# Patient Record
Sex: Female | Born: 1975
Health system: Southern US, Community
[De-identification: ages and names within clinical notes are randomized; demographics above are authoritative.]

## PROBLEM LIST (undated history)

## (undated) DIAGNOSIS — G709 Myoneural disorder, unspecified: Secondary | ICD-10-CM

## (undated) DIAGNOSIS — G35 Multiple sclerosis: Secondary | ICD-10-CM

## (undated) DIAGNOSIS — R51 Headache: Secondary | ICD-10-CM

## (undated) DIAGNOSIS — T7840XA Allergy, unspecified, initial encounter: Secondary | ICD-10-CM

## (undated) DIAGNOSIS — Z973 Presence of spectacles and contact lenses: Secondary | ICD-10-CM

## (undated) DIAGNOSIS — D649 Anemia, unspecified: Secondary | ICD-10-CM

## (undated) DIAGNOSIS — I2699 Other pulmonary embolism without acute cor pulmonale: Secondary | ICD-10-CM

## (undated) HISTORY — DX: Anemia, unspecified: D64.9

## (undated) HISTORY — PX: BREAST REDUCTION SURGERY: SHX8

## (undated) HISTORY — PX: WISDOM TOOTH EXTRACTION: SHX21

## (undated) HISTORY — PX: BREAST SURGERY: SHX581

## (undated) HISTORY — PX: ABDOMINAL HYSTERECTOMY: SHX81

## (undated) HISTORY — DX: Allergy, unspecified, initial encounter: T78.40XA

## (undated) HISTORY — DX: Multiple sclerosis: G35

## (undated) HISTORY — DX: Myoneural disorder, unspecified: G70.9

---

## 1997-07-05 ENCOUNTER — Other Ambulatory Visit: Admission: RE | Admit: 1997-07-05 | Discharge: 1997-07-05 | Payer: Self-pay | Admitting: Obstetrics & Gynecology

## 1997-07-09 ENCOUNTER — Observation Stay (HOSPITAL_COMMUNITY): Admission: AD | Admit: 1997-07-09 | Discharge: 1997-07-09 | Payer: Self-pay | Admitting: Obstetrics and Gynecology

## 1998-02-26 ENCOUNTER — Inpatient Hospital Stay (HOSPITAL_COMMUNITY): Admission: AD | Admit: 1998-02-26 | Discharge: 1998-02-28 | Payer: Self-pay | Admitting: *Deleted

## 1998-06-17 ENCOUNTER — Emergency Department (HOSPITAL_COMMUNITY): Admission: EM | Admit: 1998-06-17 | Discharge: 1998-06-17 | Payer: Self-pay | Admitting: Internal Medicine

## 1998-06-17 ENCOUNTER — Encounter: Payer: Self-pay | Admitting: Emergency Medicine

## 1998-08-12 ENCOUNTER — Other Ambulatory Visit: Admission: RE | Admit: 1998-08-12 | Discharge: 1998-08-12 | Payer: Self-pay | Admitting: *Deleted

## 1998-09-07 ENCOUNTER — Emergency Department (HOSPITAL_COMMUNITY): Admission: EM | Admit: 1998-09-07 | Discharge: 1998-09-07 | Payer: Self-pay | Admitting: Emergency Medicine

## 1999-08-22 ENCOUNTER — Other Ambulatory Visit: Admission: RE | Admit: 1999-08-22 | Discharge: 1999-08-22 | Payer: Self-pay | Admitting: Obstetrics and Gynecology

## 2000-08-21 ENCOUNTER — Other Ambulatory Visit: Admission: RE | Admit: 2000-08-21 | Discharge: 2000-08-21 | Payer: Self-pay | Admitting: Obstetrics and Gynecology

## 2001-08-25 ENCOUNTER — Other Ambulatory Visit: Admission: RE | Admit: 2001-08-25 | Discharge: 2001-08-25 | Payer: Self-pay | Admitting: Obstetrics and Gynecology

## 2001-12-27 ENCOUNTER — Inpatient Hospital Stay (HOSPITAL_COMMUNITY): Admission: AD | Admit: 2001-12-27 | Discharge: 2001-12-27 | Payer: Self-pay | Admitting: Obstetrics and Gynecology

## 2002-04-22 ENCOUNTER — Encounter: Payer: Self-pay | Admitting: Obstetrics and Gynecology

## 2002-04-22 ENCOUNTER — Ambulatory Visit (HOSPITAL_COMMUNITY): Admission: RE | Admit: 2002-04-22 | Discharge: 2002-04-22 | Payer: Self-pay | Admitting: Obstetrics and Gynecology

## 2002-07-30 ENCOUNTER — Inpatient Hospital Stay (HOSPITAL_COMMUNITY): Admission: AD | Admit: 2002-07-30 | Discharge: 2002-08-01 | Payer: Self-pay | Admitting: Obstetrics and Gynecology

## 2003-05-10 ENCOUNTER — Other Ambulatory Visit: Admission: RE | Admit: 2003-05-10 | Discharge: 2003-05-10 | Payer: Self-pay | Admitting: Obstetrics and Gynecology

## 2003-10-29 ENCOUNTER — Encounter: Admission: RE | Admit: 2003-10-29 | Discharge: 2003-10-29 | Payer: Self-pay | Admitting: Internal Medicine

## 2004-02-25 ENCOUNTER — Encounter: Admission: RE | Admit: 2004-02-25 | Discharge: 2004-02-25 | Payer: Self-pay | Admitting: Ophthalmology

## 2004-03-20 ENCOUNTER — Emergency Department (HOSPITAL_COMMUNITY): Admission: EM | Admit: 2004-03-20 | Discharge: 2004-03-20 | Payer: Self-pay | Admitting: Emergency Medicine

## 2004-06-19 ENCOUNTER — Other Ambulatory Visit: Admission: RE | Admit: 2004-06-19 | Discharge: 2004-06-19 | Payer: Self-pay | Admitting: Obstetrics and Gynecology

## 2005-07-18 ENCOUNTER — Other Ambulatory Visit: Admission: RE | Admit: 2005-07-18 | Discharge: 2005-07-18 | Payer: Self-pay | Admitting: Obstetrics and Gynecology

## 2005-12-06 ENCOUNTER — Emergency Department (HOSPITAL_COMMUNITY): Admission: EM | Admit: 2005-12-06 | Discharge: 2005-12-06 | Payer: Self-pay | Admitting: Emergency Medicine

## 2008-10-10 ENCOUNTER — Inpatient Hospital Stay (HOSPITAL_COMMUNITY): Admission: EM | Admit: 2008-10-10 | Discharge: 2008-10-13 | Payer: Self-pay | Admitting: Emergency Medicine

## 2009-10-23 ENCOUNTER — Encounter: Admission: RE | Admit: 2009-10-23 | Discharge: 2009-10-23 | Payer: Self-pay | Admitting: Neurology

## 2010-05-12 LAB — CBC
HCT: 35.6 % — ABNORMAL LOW (ref 36.0–46.0)
Hemoglobin: 12.3 g/dL (ref 12.0–15.0)
MCV: 92.5 fL (ref 78.0–100.0)
RDW: 13.1 % (ref 11.5–15.5)
WBC: 7.6 10*3/uL (ref 4.0–10.5)

## 2010-05-12 LAB — GLUCOSE, CAPILLARY
Glucose-Capillary: 120 mg/dL — ABNORMAL HIGH (ref 70–99)
Glucose-Capillary: 123 mg/dL — ABNORMAL HIGH (ref 70–99)
Glucose-Capillary: 124 mg/dL — ABNORMAL HIGH (ref 70–99)
Glucose-Capillary: 131 mg/dL — ABNORMAL HIGH (ref 70–99)
Glucose-Capillary: 147 mg/dL — ABNORMAL HIGH (ref 70–99)
Glucose-Capillary: 150 mg/dL — ABNORMAL HIGH (ref 70–99)
Glucose-Capillary: 184 mg/dL — ABNORMAL HIGH (ref 70–99)
Glucose-Capillary: 185 mg/dL — ABNORMAL HIGH (ref 70–99)

## 2010-05-12 LAB — DIFFERENTIAL
Eosinophils Relative: 2 % (ref 0–5)
Lymphocytes Relative: 39 % (ref 12–46)
Lymphs Abs: 2.9 10*3/uL (ref 0.7–4.0)
Monocytes Absolute: 0.6 10*3/uL (ref 0.1–1.0)

## 2010-05-12 LAB — ANTIPHOSPHOLIPID SYNDROME EVAL, BLD
Anticardiolipin IgA: <10 APL U/mL — ABNORMAL LOW (ref <10)
Anticardiolipin IgG: <10 GPL U/mL — ABNORMAL LOW (ref <10)
Antiphosphatidylserine IgA: <20 APS U/mL (ref <20.0)
Antiphosphatidylserine IgG: <11 GPS U/mL (ref <11.0)
Antiphosphatidylserine IgM: <25 MPS U/mL (ref <25.0)
PTT Lupus Anticoagulant: 35.9 secs (ref 32.0–43.4)
dRVVT Incubated 1:1 Mix: 39.6 secs (ref 36.1–47.0)

## 2010-05-12 LAB — COMPREHENSIVE METABOLIC PANEL
AST: 25 U/L (ref 0–37)
Albumin: 3.5 g/dL (ref 3.5–5.2)
CO2: 21 mEq/L (ref 19–32)
Calcium: 9.4 mg/dL (ref 8.4–10.5)
Creatinine, Ser: 0.82 mg/dL (ref 0.4–1.2)
GFR calc Af Amer: >60 mL/min (ref >60)
GFR calc non Af Amer: >60 mL/min (ref >60)
Total Protein: 7.5 g/dL (ref 6.0–8.3)

## 2010-05-12 LAB — BASIC METABOLIC PANEL
CO2: 26 mEq/L (ref 19–32)
Chloride: 104 mEq/L (ref 96–112)
GFR calc Af Amer: >60 mL/min (ref >60)
Sodium: 136 mEq/L (ref 135–145)

## 2010-05-12 LAB — ANA: Anti Nuclear Antibody(ANA): NEGATIVE

## 2010-05-12 LAB — LIPID PANEL
Cholesterol: 154 mg/dL (ref 0–200)
Total CHOL/HDL Ratio: 2.8 RATIO

## 2010-05-12 LAB — PROTIME-INR
INR: 1.1 (ref 0.00–1.49)
Prothrombin Time: 13.8 seconds (ref 11.6–15.2)

## 2010-06-23 NOTE — H&P (Signed)
NAME:  Kelly Alvarado, Kelly Alvarado                          ACCOUNT NO.:  [id number]   MEDICAL RECORD NO.:  [id number]                   PATIENT TYPE:  INP   LOCATION:  9160                                 FACILITY:  [facility]   PHYSICIAN:  Janine Limbo, M.D.            DATE OF BIRTH:  Aug 15, 1975   DATE OF ADMISSION:  07/30/2002  DATE OF DISCHARGE:                                HISTORY & PHYSICAL   HISTORY OF PRESENT ILLNESS:  The patient is a 35 year old gravida 5, para 2-  0-2-2, at 39-4/7 weeks who presented with spontaneous rupture of membranes  at approximately 6:30 p.m.  Uterine contractions every 4 minutes.  Clear  fluid was noted. Pregnancy has been remarkable for:  1) History of  cryosurgery.  2) One SAB and one TAB with these confidential.  3)  Questionable LMP.  4) History of migraines.  5) Persistent nausea and  vomiting.  6) Obesity.   PRENATAL LABORATORY DATA:  Blood type is O positive, Rh antibody negative,  VDRL nonreactive, rubella titer positive, hepatitis B surface antigen  negative.  Hemoglobin upon entry into practice was 11.3, it was 11.2 at 19  weeks, and 11.5 at 28 weeks. EDC of August 03, 2002, was established by last  menstrual period and was in agreement with ultrasound at approximately 18  weeks. Group B Strep culture was negative at 36 weeks.   HISTORY OF PRESENT PREGNANCY:  The patient entered care at approximately 10  to 11 weeks.  She was on Phenergan for nausea and vomiting. She did have  persistent nausea and vomiting through the early part of her pregnancy that  did resolve by approximately 23 weeks.  AFP was normal. She did have an  early Glucola secondary to mild obesity.  She had an ultrasound at 19 weeks  that showed normal growth and development.  She had a small fall at 30  weeks, but did not hit abdomen. She had an NST at 32 weeks for decreased  fetal movement that showed nonreactive NST, but a BPP was normal. Her cervix  was 3 to 4 cm on last  examination. GC, Chlamydia, and Beta Strep were  negative at 36 weeks.   PAST OBSTETRICAL HISTORY:  In 1992 she had a spontaneous miscarriage in the  first trimester. This was confidential.  In 1996 she had a first trimester  termination that is confidential.  In 1998 she had a vaginal birth of a female  infant weight 6 pounds 12 ounces at [redacted] weeks gestation, she was in labor  approximately 20 hours, she had epidural anesthesia, the baby was lactose  intolerant and was in the NICU for one week. In 2000 she had a vaginal birth  of a female infant, weight 7 pounds at 42 weeks, she was in labor 13 hours,  she had epidural anesthesia, she had no other complications. She did have  anemia with both previous  pregnancies. She was treated for hyperemesis in  her past pregnancies on an outpatient basis with fluids.   PAST MEDICAL HISTORY:  She was on oral contraceptives until October of 2003.  She had cryosurgery in 1997.   PAST SURGICAL HISTORY:  Wisdom teeth removed and a D&C in 1996 for her  termination of pregnancy.   ALLERGIES:  AMOXICILLIN which causes hives.  She reports the usual childhood  diseases.  She does have a history of migraine headaches.  Her only other  hospitalizations were for childbirth.   FAMILY HISTORY:  Her father is a diet controlled diabetic.  Her paternal  grandfather is also diabetic. Paternal grandfather also had a stroke. Her  mother and brother have migraines.  Genetic history is remarkable for her 70-  year-old son born with a mass on his testicle.   SOCIAL HISTORY:  The patient is married to the father of the baby. He is  involved and supportive.  His name is Cadyn Rodger.  The patient is [place] and of the [place] faith. 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 Her husband is also college educated and graduate educated.  He is employed as a Tree surgeon. She has been followed by the physician  service at [facility]. She  denies any alcohol, drug, or tobacco use  during this pregnancy.   PHYSICAL EXAMINATION:  VITAL SIGNS: Stable. The patient is afebrile.  HEENT:  Within normal limits.  LUNGS:  Bilateral breath sounds are clear.  HEART:  Regular rate and rhythm without murmur.  BREASTS:  Soft and nontender.  ABDOMEN:  Fundal height is approximately 39 cm. Estimated fetal weight is 7  to 8 pounds.  Uterine contractions are every three minutes of moderate  quality.  PELVIC:  Cervix is 4 cm, 90%, vertex at 0 to -1 station. The patient is  leaking clear fluid. Fetal heart rate shows a negative spontaneous CST, but  there is decreased variability noted at present. No decelerations are noted.  EXTREMITIES:  Deep tendon reflexes are 2+ without clonus. There is a trace  edema noted.   IMPRESSION:  1. Intrauterine pregnancy at 39-4/7 weeks.  2. Early labor.  3. History of cryosurgery.   PLAN:  1. Admit to birthing suite per consult with Dr. Stefano Gaul as attending     physician.  2. Routine physician orders.  3. The patient desires epidural.     Chip Boer L. Emilee Hero, C.N.M.                   Janine Limbo, M.D.    VLL/MEDQ  D:  07/30/2002  T:  07/30/2002  Job:  [id number]

## 2010-11-17 ENCOUNTER — Emergency Department (HOSPITAL_COMMUNITY): Payer: BC Managed Care – PPO

## 2010-11-17 ENCOUNTER — Inpatient Hospital Stay (HOSPITAL_COMMUNITY)
Admission: EM | Admit: 2010-11-17 | Discharge: 2010-11-22 | DRG: 078 | Disposition: A | Discharge status: Home / Self Care | Payer: BC Managed Care – PPO | Attending: Internal Medicine | Admitting: Internal Medicine

## 2010-11-17 DIAGNOSIS — G35 Multiple sclerosis: Secondary | ICD-10-CM | POA: Diagnosis present

## 2010-11-17 DIAGNOSIS — D638 Anemia in other chronic diseases classified elsewhere: Secondary | ICD-10-CM | POA: Diagnosis present

## 2010-11-17 DIAGNOSIS — I2699 Other pulmonary embolism without acute cor pulmonale: Principal | ICD-10-CM | POA: Diagnosis present

## 2010-11-17 DIAGNOSIS — R0789 Other chest pain: Secondary | ICD-10-CM | POA: Diagnosis present

## 2010-11-17 DIAGNOSIS — Z79899 Other long term (current) drug therapy: Secondary | ICD-10-CM

## 2010-11-17 DIAGNOSIS — N946 Dysmenorrhea, unspecified: Secondary | ICD-10-CM | POA: Diagnosis not present

## 2010-11-17 DIAGNOSIS — G43909 Migraine, unspecified, not intractable, without status migrainosus: Secondary | ICD-10-CM | POA: Diagnosis present

## 2010-11-17 LAB — URINALYSIS, ROUTINE W REFLEX MICROSCOPIC
Glucose, UA: NEGATIVE mg/dL
Ketones, ur: NEGATIVE mg/dL
Leukocytes, UA: NEGATIVE
Protein, ur: NEGATIVE mg/dL
Urobilinogen, UA: 0.2 mg/dL (ref 0.0–1.0)

## 2010-11-17 LAB — POCT I-STAT TROPONIN I
Troponin i, poc: 0 ng/mL (ref 0.00–0.08)
Troponin i, poc: 0 ng/mL (ref 0.00–0.08)

## 2010-11-17 LAB — COMPREHENSIVE METABOLIC PANEL
ALT: 10 U/L (ref 0–35)
BUN: 7 mg/dL (ref 6–23)
CO2: 20 mEq/L (ref 19–32)
Calcium: 9.8 mg/dL (ref 8.4–10.5)
Creatinine, Ser: 0.85 mg/dL (ref 0.50–1.10)
GFR calc Af Amer: >90 mL/min (ref >90)
GFR calc non Af Amer: 86 mL/min — ABNORMAL LOW (ref >90)
Glucose, Bld: 83 mg/dL (ref 70–99)
Sodium: 137 mEq/L (ref 135–145)
Total Protein: 7.7 g/dL (ref 6.0–8.3)

## 2010-11-17 LAB — CBC
MCV: 88.4 fL (ref 78.0–100.0)
Platelets: 308 10*3/uL (ref 150–400)
RBC: 3.62 MIL/uL — ABNORMAL LOW (ref 3.87–5.11)
RDW: 14.4 % (ref 11.5–15.5)
WBC: 10 10*3/uL (ref 4.0–10.5)

## 2010-11-17 LAB — DIFFERENTIAL
Basophils Absolute: 0.1 10*3/uL (ref 0.0–0.1)
Eosinophils Absolute: 0.2 10*3/uL (ref 0.0–0.7)
Eosinophils Relative: 2 % (ref 0–5)
Lymphs Abs: 3.8 10*3/uL (ref 0.7–4.0)
Neutrophils Relative %: 54 % (ref 43–77)

## 2010-11-17 LAB — D-DIMER, QUANTITATIVE: D-Dimer, Quant: 1.4 ug/mL-FEU — ABNORMAL HIGH (ref 0.00–0.48)

## 2010-11-17 LAB — HEPARIN LEVEL (UNFRACTIONATED): Heparin Unfractionated: 0.17 IU/mL — ABNORMAL LOW (ref 0.30–0.70)

## 2010-11-17 LAB — PROTIME-INR: Prothrombin Time: 13.5 seconds (ref 11.6–15.2)

## 2010-11-17 MED ORDER — IOHEXOL 300 MG/ML  SOLN
80.0000 mL | Freq: Once | INTRAMUSCULAR | Status: AC | PRN
  Administered 2010-11-17: 80 mL via INTRAVENOUS

## 2010-11-17 MED ORDER — GADOBENATE DIMEGLUMINE 529 MG/ML IV SOLN
20.0000 mL | Freq: Once | INTRAVENOUS | Status: AC | PRN
  Administered 2010-11-17: 20 mL via INTRAVENOUS

## 2010-11-18 LAB — CBC
HCT: 27 % — ABNORMAL LOW (ref 36.0–46.0)
Hemoglobin: 9.4 g/dL — ABNORMAL LOW (ref 12.0–15.0)
MCH: 31 pg (ref 26.0–34.0)
MCHC: 34.8 g/dL (ref 30.0–36.0)
RDW: 14.4 % (ref 11.5–15.5)

## 2010-11-18 LAB — IRON AND TIBC: Saturation Ratios: 25 % (ref 20–55)

## 2010-11-18 LAB — FERRITIN: Ferritin: 129 ng/mL (ref 10–291)

## 2010-11-18 LAB — PROTIME-INR: Prothrombin Time: 15.2 seconds (ref 11.6–15.2)

## 2010-11-18 LAB — FOLATE: Folate: 18.7 ng/mL

## 2010-11-18 NOTE — H&P (Signed)
Kelly Alvarado, Kelly Alvarado                ACCOUNT NO.:  [id number]  MEDICAL RECORD NO.:  [id number]  LOCATION:  1413                         FACILITY:  [facility]  PHYSICIAN:  Hillery Aldo, M.D.   DATE OF BIRTH:  November 23, 1975  DATE OF ADMISSION:  11/17/2010 DATE OF DISCHARGE:                             HISTORY & PHYSICAL   PRIMARY CARE PHYSICIAN:  Robyn N. Allyne Gee, M.D.  CHIEF COMPLAINT:  Sharp chest pain accompanied by nausea.  HISTORY OF PRESENT ILLNESS:  The patient is a 35 year old female with a past medical history of multiple sclerosis, who experienced the sudden onset of substernal chest pain while brushing her teeth earlier today. The patient states that the pain lasted several hours and was rated 10/10 and sharp in quality when it first came on.  The pain has gradually eased off, most notably after she received pain medications in the emergency department.  She had some associated shortness of breath, but no associated fever, chills, or cough.  Upon initial evaluation in the emergency department, a D-dimer was checked, found to be elevated, and she ultimately underwent a CT angiogram which was positive for pulmonary embolism.  The patient has no personal history of VTE and no family history of VTE.  She denies any recent prolonged travel or surgeries.  She does use Loestrin continuously for birth control.  She was referred to the hospitalist for further evaluation and treatment of her acute PE.  PAST MEDICAL HISTORY: 1. Migraine headaches. 2. Multiple sclerosis. 3. Obesity. 4. Vertigo.  PAST SURGICAL HISTORY: 1. Wisdom teeth removal. 2. D and C for pregnancy termination. 3. Breast reduction.  FAMILY HISTORY:  The patient's father is alive at 80 and has hypertension.  The patient's mother is alive at 35 and has asthma and migraines.  She has 1 brother with migraines and 3 healthy offspring.  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  She denies tobacco, alcohol, or drug use.  ALLERGIES:  Amoxicillin causes hives.  CURRENT MEDICATIONS:  The patient is unclear of her medication dosages, but she takes the following medicines regularly: 1. Tysabri infusion monthly with her last dose yesterday. 2. Trazodone q.h.s. 3. Loestrin Fe q.h.s. 4. Topamax q.h.s.  REVIEW OF SYSTEMS:  CONSTITUTIONAL:  No fever or chills.  Appetite is normal.  No weight changes.  HEENT:  Left ear congestion, otherwise negative.  CARDIOVASCULAR:  Positive for chest pain as above.  No palpitations.  RESPIRATORY:  Positive for shortness of breath.  No cough.  GI:  Positive for nausea.  No vomiting, diarrhea, melena, or hematochezia.  GU:  No dysuria.  MUSCULOSKELETAL:  Positive for left upper extremity and hand weakness that was transient and resolved.  She did have some left lower extremity pain last week.  NEUROLOGIC:  No headaches.  PHYSICAL EXAMINATION:  VITAL SIGNS:  Temperature 98.9, pulse 80, respirations 22, blood pressure 117/77, O2 saturation 100% on room air. GENERAL:  Well-developed, well-nourished, obese African American female in no acute distress. HEENT:  Normocephalic, atraumatic.  PERRL.  EOMI.  Sclerae nonicteric. Oropharynx is clear.  Tympanic membranes are normal with no visible middle ear  infection. NECK:  Supple, no thyromegaly, no lymphadenopathy, no jugular venous distention. CHEST:  Lungs are clear to auscultation bilaterally with good air movement. HEART:  Regular rate, rhythm.  No murmurs, rubs, or gallops. ABDOMEN:  Soft, nontender, nondistended with normoactive bowel sounds. EXTREMITIES:  No clubbing, edema, or cyanosis.  2+ dorsalis pedis pulses. SKIN:  Warm and dry.  No rashes. NEUROLOGIC:  The patient is alert and oriented x3.  Cranial nerves II through XII grossly intact.  Moves all extremities with equal strength with the exception of a barely detectable decrease in strength in the left  upper extremity.  DATA REVIEW: 1. CT angiogram of the chest shows subtle pulmonary embolus in the     right upper lobe. 2. MRI of the brain shows stable diffuse periventricular subcortical     white matter pattern, compatible with a given diagnosis of multiple     sclerosis which includes a lesion within the left middle cerebellar     peduncle.  No significant restricted diffusion or enhancement to     suggest active demyelination.  No acute intracranial abnormality. 3. CT of the head showed no acute intracranial abnormality.  Diffuse     periventricular predominant white matter lesions compatible with     multiple sclerosis. 4. Chest x-ray showed no active lung disease.  LABORATORY DATA:  Urinalysis is negative for nitrites and leukocytes. Troponin was 0.  Sodium is 137, potassium 3.6, chloride 106, bicarb 20, BUN 7, creatinine 0.85, glucose 83, total bilirubin 0.5, alkaline phosphatase 74, AST 12, ALT 10, total protein 7.7, albumin 3.8, calcium 9.8.  PTT was 28, PT 13.5.  D-dimer was 1.40.  White blood cell count was 10, hemoglobin 11.5, hematocrit 32, platelets 308.  ASSESSMENT AND PLAN: 1. Acute pulmonary embolism:  This is most likely provoked by her use     of oral contraceptives.  The patient has been counseled that she     will need to discontinue the use of oral contraceptives.  She was     started on IV heparin by the emergency department physician.  A     discussion was held with her and her family regarding the risks and     benefits of heparin versus Lovenox.  At this time, the patient     adamantly declines wanting to give herself Lovenox injections and     prefers to stay on heparin while in the hospital awaiting a     therapeutic INR.  We will start Coumadin per pharmacy and continue     IV heparin per the patient's wishes.  She will need treatment for 3-     6 months.  At this point, I do not think a hypercoagulable profile     is of benefit given that she has been  on oral contraceptives and     there is no family history of venous thromboembolism. 2. Multiple sclerosis:  The patient had a CT and an MRI scan done     because of her left upper extremity weakness.  The MRI did not show     any obvious active demyelination and she is under the care of a     neurologist for treatment of her multiple sclerosis with her most     recent Tysabri infusion done yesterday.  The emergency department     physician did speak with the patient's neurologist and at this     time, no further recommendations regarding her multiple sclerosis  have been made. 3. Mild normocytic anemia:  The patient states that she does not have     periods because she takes the Loestrin continuously.  Her anemia is     only mild and at this point, would simply check an anemia panel.  I     suspect this is due to chronic disease. 4. Migraine headaches:  We will treat these symptomatically should     they occur.  We will need to verify her home dose of Topamax and     resume this once the dosage is known. 5. Prophylaxis:  The patient is currently on a heparin drip for an     active deep venous thrombosis.  Time spent on admission including face-to-face time is approximately 1 hour.     Hillery Aldo, M.D.     CR/MEDQ  D:  11/17/2010  T:  11/17/2010  Job:  [id number]  cc:   Candyce Churn. Allyne Gee, M.D. Fax: [phone]  Electronically Signed by Hillery Aldo M.D. on 11/18/2010 12:28:51 PM

## 2010-11-19 ENCOUNTER — Inpatient Hospital Stay (HOSPITAL_COMMUNITY): Payer: BC Managed Care – PPO

## 2010-11-19 LAB — CBC
Hemoglobin: 10.8 g/dL — ABNORMAL LOW (ref 12.0–15.0)
MCHC: 36.2 g/dL — ABNORMAL HIGH (ref 30.0–36.0)
RBC: 3.42 MIL/uL — ABNORMAL LOW (ref 3.87–5.11)

## 2010-11-19 LAB — BASIC METABOLIC PANEL
BUN: 6 mg/dL (ref 6–23)
GFR calc Af Amer: >90 mL/min (ref >90)
GFR calc non Af Amer: >90 mL/min (ref >90)
Potassium: 3.7 mEq/L (ref 3.5–5.1)

## 2010-11-19 LAB — PROTIME-INR: Prothrombin Time: 17.3 seconds — ABNORMAL HIGH (ref 11.6–15.2)

## 2010-11-19 LAB — HEPARIN LEVEL (UNFRACTIONATED): Heparin Unfractionated: 0.55 IU/mL (ref 0.30–0.70)

## 2010-11-20 DIAGNOSIS — I2699 Other pulmonary embolism without acute cor pulmonale: Secondary | ICD-10-CM

## 2010-11-20 LAB — CBC
HCT: 29.6 % — ABNORMAL LOW (ref 36.0–46.0)
MCH: 31.3 pg (ref 26.0–34.0)
MCHC: 35.5 g/dL (ref 30.0–36.0)
MCV: 88.1 fL (ref 78.0–100.0)
RDW: 14.2 % (ref 11.5–15.5)

## 2010-11-20 LAB — LUPUS ANTICOAGULANT PANEL
Lupus Anticoagulant: NOT DETECTED
PTTLA Confirmation: 5.7 secs (ref <8.0)

## 2010-11-20 LAB — HEPARIN LEVEL (UNFRACTIONATED): Heparin Unfractionated: 0.49 IU/mL (ref 0.30–0.70)

## 2010-11-21 LAB — PROTIME-INR: Prothrombin Time: 23.7 seconds — ABNORMAL HIGH (ref 11.6–15.2)

## 2010-11-21 LAB — CBC
Hemoglobin: 10.2 g/dL — ABNORMAL LOW (ref 12.0–15.0)
RBC: 3.28 MIL/uL — ABNORMAL LOW (ref 3.87–5.11)
WBC: 9.6 10*3/uL (ref 4.0–10.5)

## 2010-11-21 LAB — HEPARIN LEVEL (UNFRACTIONATED): Heparin Unfractionated: 0.47 IU/mL (ref 0.30–0.70)

## 2010-11-22 LAB — PROTIME-INR: INR: 2.21 — ABNORMAL HIGH (ref 0.00–1.49)

## 2010-11-22 LAB — HEPARIN LEVEL (UNFRACTIONATED): Heparin Unfractionated: 0.42 IU/mL (ref 0.30–0.70)

## 2010-11-22 LAB — CBC
Platelets: 291 10*3/uL (ref 150–400)
RBC: 3.41 MIL/uL — ABNORMAL LOW (ref 3.87–5.11)
RDW: 14.4 % (ref 11.5–15.5)
WBC: 9.9 10*3/uL (ref 4.0–10.5)

## 2010-11-22 LAB — PROTHROMBIN GENE MUTATION

## 2010-11-28 NOTE — Discharge Summary (Signed)
  NAMEWALTER, Kelly Alvarado                ACCOUNT NO.:  [id number]  MEDICAL RECORD NO.:  [id number]  LOCATION:  1413                         FACILITY:  [facility]  PHYSICIAN:  Hollice Espy, M.D.DATE OF BIRTH:  [birth year]  DATE OF ADMISSION:  11/17/2010 DATE OF DISCHARGE:  11/22/2010                        DISCHARGE SUMMARY - REFERRING   ADDENDUM:  PRIMARY CARE PHYSICIAN:  Merlene Laughter. Renae Gloss, MD  Discharge addendum is as follows:  The patient's INR was therapeutic on October 17.  She has completed 5 days, which will include her heparin bridge, so she is felt to be medically stable for discharge.  She was ambulated in the hallway and saturating 97% on room air.  The plan will be to discharge the patient to home.  Her medication list will stay as above as the initial discharge summary was dictated.  The only change will be that on the day of discharge itself, she would take 10 mg of Coumadin and in the emergency department, she will take 7.5.  I have already contacted Dr. Mathews Robinsons office and her partner, Dr. Allyne Gee will see the patient on October 22nd in 5 days at 9:45 am for both an office visit and PT/INR check.  Her disposition is improved, advising her to stay home for 1 week, providing her note for work to increase activity slowly.     Hollice Espy, M.D.     SKK/MEDQ  D:  11/22/2010  T:  11/22/2010  Job:  [id number]  cc:   Merlene Laughter. Renae Gloss, M.D. Fax: [phone]  Electronically Signed by Virginia Rochester M.D. on 11/28/2010 10:20:56 AM

## 2010-12-06 NOTE — Discharge Summary (Signed)
Kelly Alvarado, Kelly Alvarado                ACCOUNT NO.:  [id number]  MEDICAL RECORD NO.:  [id number]  LOCATION:  1413                         FACILITY:  [facility]  PHYSICIAN:  Hartley Barefoot, MD    DATE OF BIRTH:  Jul 03, 1975  DATE OF ADMISSION:  11/17/2010 DATE OF DISCHARGE:  11/22/2010                        DISCHARGE SUMMARY - REFERRING   DISCHARGE DIAGNOSES: 1. Small pulmonary embolus in the right upper lobe. 2. Multiple sclerosis. 3. Migraine. 4. Anemia, normocytic normochromic.  DISCHARGE MEDICATIONS: 1. Coumadin 7.5 mg p.o. daily. 2. Ferrous sulfate 325 p.o. daily. 3. Tramadol 50 mg every 8 hours as needed for pain. 4. Topamax 50 mg 1 tablet every evening. 5. Trazodone 100 mg daily at bedtime. Medication stopped during this hospitalization:  Loestrin.  DISPOSITION AND FOLLOWUP:  Ms. Hampshire will need to follow up with her gynecologist due to her history of menstrual cramps.  She will need to follow with her primary care physician for INR check within 24-48 hours. She will need adjustment of her Coumadin doses.  She will need to be referred to a hematologist.  Her primary care physician will need to follow results of Factor V gene mutation and prothrombin mutation.  When the patient off anticoagulation, she will need a hypercoagulable panel.  BRIEF HISTORY OF PRESENT ILLNESS:  This is a 36 year old with past medical history of multiple sclerosis, who had experienced sudden onset of suprasternal chest pain while brushing her teeth earlier today.  The patient states that the pain lasted for several hours and was rated 10/10, sharp in quality.  The pain has gradually eased off after she received medication in the emergency department.  She had some associated shortness of breath, but not associated fever or chills.  PROCEDURE PERFORMED: 1. CT head, October 12th.  No acute intracranial abnormality, diffuse     periventricular predominant white matter lesions compatible with  multiple sclerosis. 2. MRI of the brain.  A stable diffuse periventricular subcortical     white matter pattern, compatible with given diagnosis of multiple     sclerosis.  This included lesion within the left middle cerebral     peduncles.  No significant restricted diffusion or enhancement to     suggest active demyelination.  The absence of this finding does not     exclude active demyelination.  No acute intracranial abnormalities. 3. CT angio.  Subtle pulmonary embolus in the right upper lobe. 4. Repeated CT head on October 14 show periventricular subcortical     white matter hypodensity compatible with MS, no interval changes.  HOSPITAL COURSE: 1. Small right upper lobe PE.  The patient was admitted to     telemetry.  She was started on heparin and Coumadin.  The patient     refused to be on Lovenox.  Her INR has been increasing     consistently.  Her INR today is 2.0.  She is day 5 of IV heparin     today.  She might be able to be discharged tomorrow.  She has been     getting 10 mg of Coumadin daily.  Pharmacist recommends to     discharge her on 7.5, and repeat INR in 24  hours.  Recommendation     of 7.5 of Coumadin is due to the patient's high Coumadin score.     Factor V Leiden and prothrombin is pending at this time.  She has a     lupus anticoagulant that was not detected.  Bilateral Dopplers of     lower extremity preliminary report, no DVT.  Her PE will be related     to hormone therapy.  She might need to be referred to a     hematologist.  The patient will need treatment likely for 6 months. 2. Migraine.  The patient had severe episode of migraine or headache     during this hospitalization.  She rates the pain very severe and     she was on heparin.  For this reason, a CT head was repeated and     was negative.  Her headache is now resolved. 3. MS.  The patient presented with weakness initially.  This has     resolved.  This is stable at this time.  She would need to  follow     up with her neurologist. 4. Anemia, normocytic normochromic.  Her iron was 65, B12 of 1355,     folate 18, ferritin 129.  This could be anemia of chronic disease.     The patient is going to be now on Coumadin and she is having her     menstrual period.  I will start ferrous sulfate 325 p.o. daily. 5. Menstrual period.  The patient started to have her menstrual period     today.  She had some abdominal cramps.  She usually gets abdominal     cramps with her menstrual periods.  I will start her on tramadol.     I advised her not to take ibuprofen or Aleve because of increased     risk for bleed.  Please monitor for any worsening menstrual bleed     at this time.  I will repeat hemoglobin in the morning.  If the     patient Hb is stable, she can be discharged home tomorrow, please make     sure that patient does not have significant bleeding from her     menstrual periods.     Hartley Barefoot, MD     BR/MEDQ  D:  11/21/2010  T:  11/21/2010  Job:  [id number]  cc:   Candyce Churn. Allyne Gee, M.D. Fax: [phone]  Marolyn Hammock. Thad Ranger, M.D. Fax: [phone]  Electronically Signed by Hartley Barefoot MD on 12/06/2010 04:30:38 PM

## 2010-12-08 MED ORDER — GADOBENATE DIMEGLUMINE 529 MG/ML IV SOLN
15.0000 mL | Freq: Once | INTRAVENOUS | Status: AC | PRN
  Administered 2010-12-08: 14 mL via INTRAVENOUS

## 2010-12-13 LAB — PROTIME-INR

## 2010-12-21 LAB — PROTIME-INR

## 2010-12-26 LAB — PROTIME-INR

## 2011-01-02 LAB — PROTIME-INR

## 2011-01-16 LAB — PROTIME-INR

## 2011-02-06 DIAGNOSIS — I2699 Other pulmonary embolism without acute cor pulmonale: Secondary | ICD-10-CM

## 2011-02-06 HISTORY — DX: Other pulmonary embolism without acute cor pulmonale: I26.99

## 2011-03-16 LAB — PROTIME-INR

## 2011-04-18 ENCOUNTER — Encounter (INDEPENDENT_AMBULATORY_CARE_PROVIDER_SITE_OTHER): Payer: BC Managed Care – PPO | Admitting: Registered Nurse

## 2011-04-18 DIAGNOSIS — N921 Excessive and frequent menstruation with irregular cycle: Secondary | ICD-10-CM

## 2011-04-18 DIAGNOSIS — N760 Acute vaginitis: Secondary | ICD-10-CM

## 2011-04-18 DIAGNOSIS — Z202 Contact with and (suspected) exposure to infections with a predominantly sexual mode of transmission: Secondary | ICD-10-CM

## 2011-06-28 ENCOUNTER — Telehealth: Payer: Self-pay | Admitting: Hematology and Oncology

## 2011-06-28 NOTE — Telephone Encounter (Signed)
lmonvm for pt re calling me for appt w/LO 

## 2011-06-29 ENCOUNTER — Telehealth: Payer: Self-pay | Admitting: Hematology and Oncology

## 2011-06-29 NOTE — Telephone Encounter (Signed)
lmonvm for pt re calling me for appt w/LO 

## 2011-07-04 ENCOUNTER — Telehealth: Payer: Self-pay | Admitting: Hematology and Oncology

## 2011-07-04 NOTE — Telephone Encounter (Signed)
S/w pt re appt for 6/11 @ 1pm. D/t per pt request.

## 2011-07-05 ENCOUNTER — Telehealth: Payer: Self-pay | Admitting: Hematology and Oncology

## 2011-07-05 NOTE — Telephone Encounter (Signed)
Referred by Dr. Allyne Gee Dx-Pulmonary Embolism

## 2011-07-09 ENCOUNTER — Telehealth: Payer: Self-pay | Admitting: Obstetrics and Gynecology

## 2011-07-09 NOTE — Telephone Encounter (Signed)
Triage/cht received 

## 2011-07-10 NOTE — Telephone Encounter (Signed)
Returned pt's call.   States she is having clear vaginal D/C.   No odor.   MInor itching. Same sx as last time was seen.   Is on several different medications that may be causing more frequent infection.  Requesting RX.  Explained needs eval to accurately diagnose cause.  Pt states is seeing several different providers for treatment and unable to afford additional copay.  Will consult with provider but recommend eval.

## 2011-07-10 NOTE — Telephone Encounter (Signed)
TC to pt.   Per EP  Informed cannot prescribe RX but may try OTC Refresh.   May help to reestablish normal pH.  Pt verbalizes comprehension.

## 2011-07-10 NOTE — Telephone Encounter (Signed)
TC to pt. LM to return call regarding message. 

## 2011-07-17 ENCOUNTER — Encounter: Payer: Self-pay | Admitting: Hematology and Oncology

## 2011-07-17 ENCOUNTER — Ambulatory Visit: Payer: BC Managed Care – PPO

## 2011-07-17 ENCOUNTER — Ambulatory Visit (HOSPITAL_BASED_OUTPATIENT_CLINIC_OR_DEPARTMENT_OTHER): Payer: BC Managed Care – PPO | Admitting: Hematology and Oncology

## 2011-07-17 ENCOUNTER — Telehealth: Payer: Self-pay | Admitting: Hematology and Oncology

## 2011-07-17 VITALS — BP 115/75 | HR 64 | Temp 98.9°F | Ht 65.5 in | Wt 180.9 lb

## 2011-07-17 DIAGNOSIS — K5900 Constipation, unspecified: Secondary | ICD-10-CM

## 2011-07-17 DIAGNOSIS — I2699 Other pulmonary embolism without acute cor pulmonale: Secondary | ICD-10-CM | POA: Insufficient documentation

## 2011-07-17 DIAGNOSIS — G35 Multiple sclerosis: Secondary | ICD-10-CM | POA: Insufficient documentation

## 2011-07-17 DIAGNOSIS — R634 Abnormal weight loss: Secondary | ICD-10-CM

## 2011-07-17 NOTE — Progress Notes (Signed)
CC:   Robyn N. Allyne Gee, M.D.  IDENTIFYING STATEMENT:  The patient is a 36 year old woman seen at request of Dr. Allyne Gee with pulmonary embolism.  HISTORY OF PRESENT ILLNESS:  The patient was diagnosed on 11/17/2010 when she presented to the emergency room with sudden onset retrosternal type chest pain associated with shortness of breath.  The patient notes that the week prior to presentation she had slipped and sprained her left ankle.  Symptoms were pain associated with swelling.  She was placed in a soft cast but was mobile thereafter.  The patient also notes that she was on birth control pills at the time, which she has since discontinued.  She received a CT angiogram that showed a pulmonary embolus in her right upper lung.  The patient had lower extremity Dopplers performed which were negative.  She was placed on heparin and eventually bridged to Coumadin.  Limited hypercoagulable workup was performed in the hospital and results showed that she was negative for factor V and prothrombin gene mutation.  In addition, lupus anticoagulant was negative.  Her Coumadin levels are managed through Dr. Kellie Moor office.  She has tried to maintain an INR between 2 and 3. Besides a little gingival bleeding she tolerated Coumadin with very minimal difficulties.  She does note alopecia and has occasional skin rash.  She has underlying multiple sclerosis.  She has fatigue and generalized pain in her limbs which she relates to her MS.  PAST MEDICAL HISTORY: 1. Pulmonary embolus. 2. Multiple sclerosis diagnosed 2 years ago.  ALLERGIES:  Amoxicillin.  MEDICATIONS: 1. Norco 3/325 one tablet every 6 hrs as needed. 2. Cleocin ointment. 3. Vibramycin 100 mg daily. 4. Topamax 200 mg. 5. Desyrel 100 mg. 6. Triamcinolone cream. 7. Warfarin dosed through to her PCP's office.  SOCIAL HISTORY:  The patient is married with 3 children.  Denies alcohol, tobacco use.  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  FAMILY HISTORY:  The patient's paternal grandfather had prostate cancer. Paternal uncle with metastatic bladder cancer.  Denies a family history for thrombosis.  REVIEW OF SYSTEMS:  Has baseline fatigue with generalized pain from MS. Currently she rates the pain at 3/10.  Denies fever, chills, night sweats, anorexia.  Has lost approximately 15 to 20 pounds in weight in last 6 months.  GI:  Prone to constipation.  Denies nausea, vomiting, abdominal pain, or diarrhea.  GU:  Denies dysuria, hematuria, nocturia, frequency.  Cardiovascular:  Denies chest pain, PND, orthopnea, ankle swelling.  Respiratory:  Denies cough, hemoptysis, wheeze, shortness of breath.  Skin:  Notes areas of pigmentation since she has been on Coumadin.  Areas are nontender.  Musculoskeletal:  Has generalized joint aches, muscle pains.  Neurologic:  Denies headaches, vision changes, extremity weakness.  Rest of review of systems negative.  PHYSICAL EXAMINATION:  General:  The patient is a well-appearing, well- nourished woman in no distress.  Vitals:  Pulse 64, blood pressure 115/75, temperature 98.9, respirations 20, weight 180.8 pounds.  HEENT: Head is atraumatic, normocephalic.  Sclerae anicteric.  Pupils equal, round, reactive to light.  Mouth moist.  No thrush, ulcerations or lesions.  Neck:  Supple.  Chest:  Clear to percussion and auscultation. Cardiovascular:  1st and 2nd heart sounds present.  No added sounds or murmurs.  Abdomen:  Soft, nontender.  No palpable masses.  Bowel sounds present.  Extremities:  Without edema.  Pulses present and symmetrical. CNS:  Nonfocal.  IMPRESSION AND PLAN:  Ms. Thalmann is a  pleasant 36 year old woman with history of bilateral pulmonary emboli occurring in October 2012.  At that time she received limited hypercoagulable workup with factor V Leiden and prothrombin gene mutation to be negative.  Lupus anticoagulant was not detected at that time.  The  patient is currently on Coumadin and monitored through Dr. Kellie Moor office.  INR goal is between 2 and 3.  The patient was told that I was recommending at least a year of anticoagulation.  I am a little concerned about her ongoing weight loss and recent bout of constipation.  She requires a GI evaluation and will begin with obtaining occult stool cards x3.  I will have her proceed with a CT scan of abdomen and pelvis.  She did not have 1 in the hospital to rule out neoplasia.  I will have her returning in about 4 months' time and at that time she will be completing a year of anticoagulation.  Will obtain a CT angiogram prior to her visit and then proceed from there.  I spent more than half the time in discussion and  making recommendations.    ______________________________ Laurice Record, M.D. LIO/MEDQ  D:  07/17/2011  T:  07/17/2011  Job:  [id number]

## 2011-07-17 NOTE — Patient Instructions (Signed)
LAKEASHA PETION  [id number]  [facility] Discharge Instructions  RECOMMENDATIONS MADE BY THE CONSULTANT AND ANY TEST RESULTS WILL BE SENT TO YOUR REFERRING DOCTOR.   EXAM FINDINGS BY MD TODAY AND SIGNS AND SYMPTOMS TO REPORT TO CLINIC OR PRIMARY MD:   Your current list of medications are: No current outpatient prescriptions on file.     INSTRUCTIONS GIVEN AND DISCUSSED:   SPECIAL INSTRUCTIONS/FOLLOW-UP:  See above.  I acknowledge that I have been informed and understand all the instructions given to me and received a copy. I do not have any more questions at this time, but understand that I may call the [facility] at [phone] during business hours should I have any further questions or need assistance in obtaining follow-up care.

## 2011-07-17 NOTE — Telephone Encounter (Signed)
Gv pt appt for oct2013.  scheduled pt for ct scan on 06/21 @ [facility]. scheduled pt for ct angiogram for 09/27 @ [facility]

## 2011-07-17 NOTE — Progress Notes (Signed)
This office note has been dictated.

## 2011-07-23 ENCOUNTER — Telehealth: Payer: Self-pay | Admitting: Obstetrics and Gynecology

## 2011-07-23 NOTE — Telephone Encounter (Signed)
Triage/cht received 

## 2011-07-23 NOTE — Telephone Encounter (Signed)
Tc to pt per telephone call. Pt c/o urinary urgency. Mild dysuria. No fever. Offered pt appt for eval tomorrow;however pt declines at this time. Pt states,"will call back to sched an appt after end of menses". Informed pt to follow up with urgent care facility if sx's increase. Pt voices understanding.

## 2011-07-27 ENCOUNTER — Ambulatory Visit (HOSPITAL_COMMUNITY)
Admission: RE | Admit: 2011-07-27 | Discharge: 2011-07-27 | Disposition: A | Discharge status: Home / Self Care | Payer: BC Managed Care – PPO | Source: Ambulatory Visit | Attending: Hematology and Oncology | Admitting: Hematology and Oncology

## 2011-07-27 ENCOUNTER — Telehealth: Payer: Self-pay | Admitting: *Deleted

## 2011-07-27 DIAGNOSIS — I2699 Other pulmonary embolism without acute cor pulmonale: Secondary | ICD-10-CM | POA: Insufficient documentation

## 2011-07-27 DIAGNOSIS — R634 Abnormal weight loss: Secondary | ICD-10-CM | POA: Insufficient documentation

## 2011-07-27 DIAGNOSIS — K8020 Calculus of gallbladder without cholecystitis without obstruction: Secondary | ICD-10-CM | POA: Insufficient documentation

## 2011-07-27 DIAGNOSIS — K5900 Constipation, unspecified: Secondary | ICD-10-CM | POA: Insufficient documentation

## 2011-07-27 DIAGNOSIS — Z86711 Personal history of pulmonary embolism: Secondary | ICD-10-CM | POA: Insufficient documentation

## 2011-07-27 DIAGNOSIS — D649 Anemia, unspecified: Secondary | ICD-10-CM | POA: Insufficient documentation

## 2011-07-27 MED ORDER — IOHEXOL 300 MG/ML  SOLN
100.0000 mL | Freq: Once | INTRAMUSCULAR | Status: AC | PRN
  Administered 2011-07-27: 100 mL via INTRAVENOUS

## 2011-07-27 NOTE — Telephone Encounter (Signed)
Spoke with pt and informed pt re: CT scans results fine as per md.  Pt voiced understanding.

## 2011-10-31 ENCOUNTER — Telehealth: Payer: Self-pay | Admitting: Hematology and Oncology

## 2011-10-31 NOTE — Telephone Encounter (Signed)
Moved 10/1 appt to [facility] due to call for LO - appt change made per LO. lmonvm for pt w/new time for 11:30 am on 10/1.

## 2011-11-02 ENCOUNTER — Ambulatory Visit (HOSPITAL_COMMUNITY): Payer: BC Managed Care – PPO

## 2011-11-06 ENCOUNTER — Ambulatory Visit (HOSPITAL_BASED_OUTPATIENT_CLINIC_OR_DEPARTMENT_OTHER): Payer: BC Managed Care – PPO | Admitting: Lab

## 2011-11-06 ENCOUNTER — Encounter: Payer: Self-pay | Admitting: Family

## 2011-11-06 ENCOUNTER — Telehealth: Payer: Self-pay | Admitting: Hematology and Oncology

## 2011-11-06 ENCOUNTER — Ambulatory Visit (HOSPITAL_COMMUNITY)
Admission: RE | Admit: 2011-11-06 | Discharge: 2011-11-06 | Disposition: A | Discharge status: Home / Self Care | Payer: BC Managed Care – PPO | Source: Ambulatory Visit | Attending: Family | Admitting: Family

## 2011-11-06 ENCOUNTER — Other Ambulatory Visit: Payer: Self-pay | Admitting: Hematology and Oncology

## 2011-11-06 ENCOUNTER — Ambulatory Visit (HOSPITAL_BASED_OUTPATIENT_CLINIC_OR_DEPARTMENT_OTHER): Payer: BC Managed Care – PPO | Admitting: Family

## 2011-11-06 VITALS — BP 138/87 | HR 65 | Temp 97.9°F | Resp 20 | Ht 65.5 in | Wt 179.1 lb

## 2011-11-06 DIAGNOSIS — I2699 Other pulmonary embolism without acute cor pulmonale: Secondary | ICD-10-CM

## 2011-11-06 DIAGNOSIS — Z7901 Long term (current) use of anticoagulants: Secondary | ICD-10-CM | POA: Insufficient documentation

## 2011-11-06 LAB — CBC WITH DIFFERENTIAL/PLATELET
Basophils Absolute: 0 10*3/uL (ref 0.0–0.1)
Eosinophils Absolute: 0 10*3/uL (ref 0.0–0.5)
HCT: 35 % (ref 34.8–46.6)
HGB: 12.2 g/dL (ref 11.6–15.9)
LYMPH%: 5.8 % — ABNORMAL LOW (ref 14.0–49.7)
MCV: 90.8 fL (ref 79.5–101.0)
MONO#: 0.2 10*3/uL (ref 0.1–0.9)
MONO%: 7.5 % (ref 0.0–14.0)
NEUT#: 2 10*3/uL (ref 1.5–6.5)
NEUT%: 85.4 % — ABNORMAL HIGH (ref 38.4–76.8)
Platelets: 257 10*3/uL (ref 145–400)
WBC: 2.3 10*3/uL — ABNORMAL LOW (ref 3.9–10.3)

## 2011-11-06 LAB — COMPREHENSIVE METABOLIC PANEL (CC13)
ALT: 12 U/L (ref 0–55)
AST: 14 U/L (ref 5–34)
BUN: 7 mg/dL (ref 7.0–26.0)
Calcium: 9.5 mg/dL (ref 8.4–10.4)
Creatinine: 0.8 mg/dL (ref 0.6–1.1)
Total Bilirubin: 0.6 mg/dL (ref 0.20–1.20)

## 2011-11-06 MED ORDER — IOHEXOL 350 MG/ML SOLN
100.0000 mL | Freq: Once | INTRAVENOUS | Status: AC | PRN
  Administered 2011-11-06: 100 mL via INTRAVENOUS

## 2011-11-06 NOTE — Progress Notes (Signed)
Patient ID: Kelly Alvarado, female   DOB: 04-Mar-1975, 36 y.o.   MRN: [id number] CSN: [id number]  CC: Robyn N. Allyne Gee, M.D.  Identifying Statement: ALEKSA Alvarado is a 36 y.o. African-American female with a history of bilateral pulmonary embolism who presents for follow-up.   Interval History: Kelly Alvarado is a 36 year-old African-American female with a history of bilateral pulmonary embolism that was originally diagnosed on 11/17/2010 when the patient presented to the ER.  Since the patient's last office visit on 07/17/2011, the patient states that she has been doing well.  She has complaints of chronic back pain and menorrhagia that has exacerbated since starting Coumadin therapy.  Kelly Alvarado states that her menstrual cycle lasts 2-3 weeks of heavy flow and she has been missing work during the first few days of her cycle every month due to the amount of blood loss. Kelly Alvarado had a CT of the abdomen/pelvis on 07/27/1011 which did not demonstrate any lytic or blastic lesions. The patient denies any other symptomatology including SOB, pain, swelling, constipation, N/V/D and no other unusual bleeding/bruising.  Dr. Allyne Gee has been managing the patient's Coumadin therapy and recently increased the dosage to 11.5 mg on Tuesdays and Thursdays from 7.5 mg daily.  Kelly Alvarado' last INR taken on 11/01/2101 she self-reports as 1.7 or 1.8.  Kelly Alvarado was hesitant to obtain CTA Chest because she did not want to drink any contrast media again.  The patient was reassured that CTA of the Chest involves IV contrast media, not oral contrast.    Medications: Current Outpatient Prescriptions  Medication Sig Dispense Refill  . clindamycin (CLEOCIN T) 1 % external solution       . Clobetasol Propionate Emulsion 0.05 % topical foam       . doxycycline (VIBRAMYCIN) 100 MG capsule       . Fingolimod HCl (GILENYA) 0.5 MG CAPS Take 1 capsule by mouth daily.      Marland Kitchen FLUOCINOLONE ACETONIDE SCALP 0.01 % external oil       .  HYDROcodone-acetaminophen (NORCO) 5-325 MG per tablet       . topiramate (TOPAMAX) 100 MG tablet       . traZODone (DESYREL) 100 MG tablet       . triamcinolone cream (KENALOG) 0.1 %       . warfarin (COUMADIN) 7.5 MG tablet Take 7.5mg  daily except Tues and Thurs - takes 11.5 (one and a half of 7.5mg  pills)        Allergies: Allergies  Allergen Reactions  . Amoxicillin Hives    Past Medical History: Past Medical History  Diagnosis Date  . Allergy   . Anemia     with pregnancy  . Neuromuscular disorder     MS     Family History: Family History  Problem Relation Age of Onset  . Cancer Paternal Uncle   . Cancer Paternal Grandfather     Social History: History  Substance Use Topics  . Smoking status: Never Smoker   . Smokeless tobacco: Never Used  . Alcohol Use: No    Review of Systems: 10 point review of systems was completed and is negative except as noted above.   Physical Exam: Blood pressure 138/87, pulse 65, temperature 97.9 F (36.6 C), temperature source Oral, resp. rate 20, height 5' 5.5" (1.664 m), weight 179 lb 1.6 oz (81.239 kg).  General appearance: Alert, cooperative, well nourished, no apparent distress Head: Normocephalic, without obvious abnormality, atraumatic Eyes: Conjunctivae/corneas clear, PERRLA, EOMI Nose:  Nares, septum and mucosa are normal, no drainage or sinus tenderness Neck: No adenopathy, supple, symmetrical, trachea midline, thyroid not enlarged, no tenderness Resp: Clear to auscultation bilaterally Cardio: Regular rate and rhythm, S1, S2 normal, no murmur, click, rub or gallop GI: Soft, distended, non-tender; normoactive bowel sounds, no organomegaly Extremities: Extremities normal, atraumatic, no cyanosis or edema Neurologic: Grossly normal   Laboratory Data: No results found for this or any previous visit (from the past 24 hour(s)).  Imaging:   Ct Abdomen Pelvis W Contrast 07/27/2011  *RADIOLOGY REPORT*  Clinical Data:  Weight loss, constipation and anemia.  Prior history of pulmonary embolism.  CT ABDOMEN AND PELVIS WITH CONTRAST  Technique:  Multidetector CT imaging of the abdomen and pelvis was performed following the standard protocol during bolus administration of intravenous contrast.  Contrast: OMNIPAQUE IOHEXOL 300 MG/ML  SOLN  Comparison: No priors.  Findings:  Lung Bases: Unremarkable.  Abdomen/Pelvis:  Focal perfusion anomaly adjacent to the falciform ligament (a common and benign finding). The liver is otherwise unremarkable in appearance.  Numerous gallstones within a contracted gallbladder.  No signs of acute cholecystitis at this time.  No intra or extrahepatic biliary ductal dilatation.  The enhanced appearance of the pancreas, spleen, bilateral adrenal glands and bilateral kidneys is unremarkable.  Normal appendix.  No ascites or pneumoperitoneum and no pathologic distension of bowel.  No definite pathologic lymphadenopathy identified within the abdomen or pelvis.  Uterus, bilateral ovaries and urinary bladder are unremarkable in appearance.  Musculoskeletal: There are no aggressive appearing lytic or blastic lesions noted in the visualized portions of the skeleton.  IMPRESSION: 1.  The gallbladder is filled with gallstones and appears completely contracted.  No findings to suggest acute cholecystitis at this time. 2.  No intra or extrahepatic biliary ductal dilatation to suggest choledocholithiasis or biliary tract obstruction. 3.  Normal appendix.  Original Report Authenticated By: Florencia Reasons, M.D.    Impression/Plan: Mrs. Aleaya Latona. Alvarado is a 36 year-old African-American female with a history of bilateral pulmonary embolism who presented for follow-up. The patient is scheduled to receive a CTA Chest today in addition to laboratories (CMP, CBC, INR, Folate, Ferritin and Iron/TIBC).  If there is not any evidence of a PE today, the patient will discontinue Coumadin and return for hypercoagulable  panel in 6 weeks with an office visit in 8 weeks' time.  If the patient has evidence of a PE today, Dr. Dalene Carrow discussed changing the patient's anticoagulant therapy to Xarelto.  We'll await the results of the outstanding diagnostics to chart the patient's future plan of care.   Larina Bras, NP-C 11/06/2011, 12:18 PM

## 2011-11-06 NOTE — Patient Instructions (Signed)
Limit consumption of vitamin K (i.e. Green leafy vegetables) while on Coumadin.

## 2011-11-06 NOTE — Telephone Encounter (Signed)
gv appts for today

## 2011-11-07 ENCOUNTER — Ambulatory Visit (HOSPITAL_COMMUNITY)
Admission: RE | Admit: 2011-11-07 | Discharge: 2011-11-07 | Disposition: A | Discharge status: Home / Self Care | Payer: BC Managed Care – PPO | Source: Ambulatory Visit | Attending: Hematology and Oncology | Admitting: Hematology and Oncology

## 2011-11-07 ENCOUNTER — Other Ambulatory Visit: Payer: Self-pay | Admitting: Hematology and Oncology

## 2011-11-07 ENCOUNTER — Telehealth: Payer: Self-pay | Admitting: Hematology and Oncology

## 2011-11-07 ENCOUNTER — Encounter (HOSPITAL_COMMUNITY): Payer: Self-pay

## 2011-11-07 ENCOUNTER — Other Ambulatory Visit: Payer: Self-pay | Admitting: *Deleted

## 2011-11-07 ENCOUNTER — Telehealth: Payer: Self-pay | Admitting: *Deleted

## 2011-11-07 DIAGNOSIS — Z86711 Personal history of pulmonary embolism: Secondary | ICD-10-CM | POA: Insufficient documentation

## 2011-11-07 DIAGNOSIS — I2699 Other pulmonary embolism without acute cor pulmonale: Secondary | ICD-10-CM

## 2011-11-07 DIAGNOSIS — R918 Other nonspecific abnormal finding of lung field: Secondary | ICD-10-CM | POA: Insufficient documentation

## 2011-11-07 DIAGNOSIS — R0602 Shortness of breath: Secondary | ICD-10-CM | POA: Insufficient documentation

## 2011-11-07 HISTORY — DX: Other pulmonary embolism without acute cor pulmonale: I26.99

## 2011-11-07 LAB — IRON AND TIBC
%SAT: 18 % — ABNORMAL LOW (ref 20–55)
Iron: 59 ug/dL (ref 42–145)
UIBC: 263 ug/dL (ref 125–400)

## 2011-11-07 LAB — FOLATE RBC: RBC Folate: 402 ng/mL (ref >=366)

## 2011-11-07 LAB — FERRITIN: Ferritin: 21 ng/mL (ref 10–291)

## 2011-11-07 MED ORDER — TECHNETIUM TO 99M ALBUMIN AGGREGATED
3.1000 | Freq: Once | INTRAVENOUS | Status: AC | PRN
  Administered 2011-11-07: 3.1 via INTRAVENOUS

## 2011-11-07 MED ORDER — TECHNETIUM TC 99M DIETHYLENETRIAME-PENTAACETIC ACID
38.1000 | Freq: Once | INTRAVENOUS | Status: AC | PRN
  Administered 2011-11-07: 38.1 via INTRAVENOUS

## 2011-11-07 NOTE — Telephone Encounter (Signed)
Pt already schedule for and has completed VQ scan. No f/u order at this time.

## 2011-11-07 NOTE — Telephone Encounter (Signed)
Spoke with pt on cell phone and informed pt re:  Per Dr. Dalene Carrow,  VQ scan was negative.  Pt can stop  Coumadin  Tonight 11/07/11.   Informed pt that a scheduler will contact pt with appts for lab and f/u with md.   Reinforced with pt that she must keep appts .   Pt voiced understanding.

## 2011-11-07 NOTE — Telephone Encounter (Signed)
s.w. pt and advised on appt today....sed

## 2011-11-08 ENCOUNTER — Telehealth: Payer: Self-pay | Admitting: Hematology and Oncology

## 2011-11-08 NOTE — Telephone Encounter (Signed)
s.w. pt and advised on 10.29.13 and 11.6.13 appt....Marland Kitchensed

## 2011-11-09 ENCOUNTER — Other Ambulatory Visit (HOSPITAL_COMMUNITY): Payer: BC Managed Care – PPO

## 2011-12-04 ENCOUNTER — Other Ambulatory Visit (HOSPITAL_BASED_OUTPATIENT_CLINIC_OR_DEPARTMENT_OTHER): Payer: BC Managed Care – PPO | Admitting: Lab

## 2011-12-04 DIAGNOSIS — I2699 Other pulmonary embolism without acute cor pulmonale: Secondary | ICD-10-CM

## 2011-12-04 LAB — CBC WITH DIFFERENTIAL/PLATELET
BASO%: 0.3 % (ref 0.0–2.0)
Basophils Absolute: 0 10*3/uL (ref 0.0–0.1)
Eosinophils Absolute: 0 10*3/uL (ref 0.0–0.5)
HCT: 31.7 % — ABNORMAL LOW (ref 34.8–46.6)
HGB: 11.1 g/dL — ABNORMAL LOW (ref 11.6–15.9)
LYMPH%: 5.5 % — ABNORMAL LOW (ref 14.0–49.7)
MONO#: 0.2 10*3/uL (ref 0.1–0.9)
NEUT#: 1.9 10*3/uL (ref 1.5–6.5)
NEUT%: 85.6 % — ABNORMAL HIGH (ref 38.4–76.8)
Platelets: 248 10*3/uL (ref 145–400)
WBC: 2.3 10*3/uL — ABNORMAL LOW (ref 3.9–10.3)
lymph#: 0.1 10*3/uL — ABNORMAL LOW (ref 0.9–3.3)

## 2011-12-07 LAB — HYPERCOAGULABLE PANEL, COMPREHENSIVE
AntiThromb III Func: 109 % (ref 76–126)
Anticardiolipin IgA: 3 APL U/mL (ref <22)
Anticardiolipin IgG: 15 GPL U/mL (ref <23)
Anticardiolipin IgM: 6 MPL U/mL (ref <11)
Beta-2 Glyco I IgG: 0 G Units (ref <20)
Beta-2-Glycoprotein I IgM: 7 M Units (ref <20)
DRVVT: 34.1 secs (ref <42.9)
PTT Lupus Anticoagulant: 33.1 secs (ref 28.0–43.0)
Protein S Activity: 89 % (ref 69–129)
Protein S Total: 82 % (ref 60–150)

## 2011-12-07 LAB — D-DIMER, QUANTITATIVE: D-Dimer, Quant: 0.3 ug/mL-FEU (ref 0.00–0.48)

## 2011-12-12 ENCOUNTER — Encounter: Payer: Self-pay | Admitting: Obstetrics and Gynecology

## 2011-12-12 ENCOUNTER — Encounter: Payer: Self-pay | Admitting: Hematology and Oncology

## 2011-12-12 ENCOUNTER — Ambulatory Visit (HOSPITAL_BASED_OUTPATIENT_CLINIC_OR_DEPARTMENT_OTHER): Payer: BC Managed Care – PPO | Admitting: Hematology and Oncology

## 2011-12-12 ENCOUNTER — Ambulatory Visit (INDEPENDENT_AMBULATORY_CARE_PROVIDER_SITE_OTHER): Payer: BC Managed Care – PPO | Admitting: Obstetrics and Gynecology

## 2011-12-12 VITALS — BP 128/81 | HR 68 | Temp 97.5°F | Resp 20 | Ht 65.5 in | Wt 177.3 lb

## 2011-12-12 VITALS — BP 114/72 | HR 78 | Ht 64.5 in | Wt 180.0 lb

## 2011-12-12 DIAGNOSIS — G35 Multiple sclerosis: Secondary | ICD-10-CM

## 2011-12-12 DIAGNOSIS — N898 Other specified noninflammatory disorders of vagina: Secondary | ICD-10-CM

## 2011-12-12 DIAGNOSIS — Z124 Encounter for screening for malignant neoplasm of cervix: Secondary | ICD-10-CM

## 2011-12-12 DIAGNOSIS — Z86711 Personal history of pulmonary embolism: Secondary | ICD-10-CM

## 2011-12-12 DIAGNOSIS — I8290 Acute embolism and thrombosis of unspecified vein: Secondary | ICD-10-CM

## 2011-12-12 DIAGNOSIS — Z01419 Encounter for gynecological examination (general) (routine) without abnormal findings: Secondary | ICD-10-CM

## 2011-12-12 LAB — POCT WET PREP (WET MOUNT): pH: 5

## 2011-12-12 MED ORDER — METRONIDAZOLE 0.75 % VA GEL: 1.0000 | Freq: Every day | VAGINAL | Status: DC

## 2011-12-12 NOTE — Patient Instructions (Signed)
[facility]  [id number]   [facility] - AFTER VISIT SUMMARY   **RECOMMENDATIONS MADE BY THE CONSULTANT AND ANY TEST    RESULTS WILL BE SENT TO YOUR REFERRING DOCTORS.   YOUR EXAM FINDINGS, LABS AND RESULTS WERE DISCUSSED BY YOUR MD TODAY.  YOU CAN GO TO THE [facility] WEB SITE FOR INSTRUCTIONS ON HOW TO ASSESS MY CHART FOR ADDITIONAL INFORMATION AS NEEDED.  Your Updated drug allergies are: Allergies as of 12/12/2011 - Review Complete 12/12/2011  Allergen Reaction Noted  . Amoxicillin Hives 07/17/2011    Your current list of medications are: Current Outpatient Prescriptions  Medication Sig Dispense Refill  . clindamycin (CLEOCIN T) 1 % external solution       . Clobetasol Propionate Emulsion 0.05 % topical foam       . doxycycline (VIBRAMYCIN) 100 MG capsule       . Fingolimod HCl (GILENYA) 0.5 MG CAPS Take 1 capsule by mouth daily.      Marland Kitchen FLUOCINOLONE ACETONIDE SCALP 0.01 % external oil       . HYDROcodone-acetaminophen (NORCO) 5-325 MG per tablet       . topiramate (TOPAMAX) 100 MG tablet       . triamcinolone cream (KENALOG) 0.1 %          INSTRUCTIONS GIVEN AND DISCUSSED:  See attached schedule   SPECIAL INSTRUCTIONS/FOLLOW-UP:  See above.  I acknowledge that I have been informed and understand all the instructions given to me and received a copy.I know to contact the clinic, my physician, or go to the emergency Department if any problems should occur.   I do not have any more questions at this time, but understand that I may call the [facility] at [phone] during business hours should I have any further questions or need assistance in obtaining follow-up care.

## 2011-12-12 NOTE — Progress Notes (Signed)
Regular Periods: yes Mammogram: no  Monthly Breast Ex.: yes Exercise: yes  Tetanus < 10 years: no Seatbelts: yes  NI. Bladder Functn.: yes Abuse at home: no  Daily BM's: no regular for pt Stressful Work: yes  Healthy Diet: yes Sigmoid-Colonoscopy: no  Calcium: no Medical problems this year: vaginal odor   LAST PAP:7/12  Contraception: none. Pt cannot take due to embolism  Mammogram:  no  PCP: DR. SANDERS  PMH: NO CHANGE  FMH: NO CHANGE  Last Bone Scan: NO  PT IS MARRIED.

## 2011-12-12 NOTE — Patient Instructions (Addendum)
Avoid: - excess soap on genital area (consider using plain oatmeal soap) - use of powder or sprays in genital area - douching - wearing underwear to bed (except with menses) - using more than is directed detergent when washing clothes - tight fitting garments around genital area - excess sugar intake  Bacterial Vaginosis Bacterial vaginosis (BV) is a vaginal infection where the normal balance of bacteria in the vagina is disrupted. The normal balance is then replaced by an overgrowth of certain bacteria. There are several different kinds of bacteria that can cause BV. BV is the most common vaginal infection in women of childbearing age. CAUSES   The cause of BV is not fully understood. BV develops when there is an increase or imbalance of harmful bacteria.  Some activities or behaviors can upset the normal balance of bacteria in the vagina and put women at increased risk including:  Having a new sex partner or multiple sex partners.  Douching.  Using an intrauterine device (IUD) for contraception.  It is not clear what role sexual activity plays in the development of BV. However, women that have never had sexual intercourse are rarely infected with BV. Women do not get BV from toilet seats, bedding, swimming pools or from touching objects around them.  SYMPTOMS   Grey vaginal discharge.  A fish-like odor with discharge, especially after sexual intercourse.  Itching or burning of the vagina and vulva.  Burning or pain with urination.  Some women have no signs or symptoms at all. DIAGNOSIS  Your caregiver must examine the vagina for signs of BV. Your caregiver will perform lab tests and look at the sample of vaginal fluid through a microscope. They will look for bacteria and abnormal cells (clue cells), a pH test higher than 4.5, and a positive amine test all associated with BV.  RISKS AND COMPLICATIONS   Pelvic inflammatory disease (PID).  Infections following gynecology  surgery.  Developing HIV.  Developing herpes virus. TREATMENT  Sometimes BV will clear up without treatment. However, all women with symptoms of BV should be treated to avoid complications, especially if gynecology surgery is planned. Female partners generally do not need to be treated. However, BV may spread between female sex partners so treatment is helpful in preventing a recurrence of BV.   BV may be treated with antibiotics. The antibiotics come in either pill or vaginal cream forms. Either can be used with nonpregnant or pregnant women, but the recommended dosages differ. These antibiotics are not harmful to the baby.  BV can recur after treatment. If this happens, a second round of antibiotics will often be prescribed.  Treatment is important for pregnant women. If not treated, BV can cause a premature delivery, especially for a pregnant woman who had a premature birth in the past. All pregnant women who have symptoms of BV should be checked and treated.  For chronic reoccurrence of BV, treatment with a type of prescribed gel vaginally twice a week is helpful. HOME CARE INSTRUCTIONS   Finish all medication as directed by your caregiver.  Do not have sex until treatment is completed.  Tell your sexual partner that you have a vaginal infection. They should see their caregiver and be treated if they have problems, such as a mild rash or itching.  Practice safe sex. Use condoms. Only have 1 sex partner. PREVENTION  Basic prevention steps can help reduce the risk of upsetting the natural balance of bacteria in the vagina and developing BV:  Do not   have sexual intercourse (be abstinent).  Do not douche.  Use all of the medicine prescribed for treatment of BV, even if the signs and symptoms go away.  Tell your sex partner if you have BV. That way, they can be treated, if needed, to prevent reoccurrence. SEEK MEDICAL CARE IF:   Your symptoms are not improving after 3 days of  treatment.  You have increased discharge, pain, or fever. MAKE SURE YOU:   Understand these instructions.  Will watch your condition.  Will get help right away if you are not doing well or get worse. FOR MORE INFORMATION  Division of STD Prevention (DSTDP), Centers for Disease Control and Prevention: www.cdc.gov/std American Social Health Association (ASHA): www.ashastd.org  Document Released: 01/22/2005 Document Revised: 04/16/2011 Document Reviewed: 07/15/2008 Exit[employer] Patient Information 2013 [employer].  

## 2011-12-12 NOTE — Progress Notes (Signed)
This office note has been dictated.

## 2011-12-12 NOTE — Progress Notes (Signed)
Subjective:    Kelly Alvarado is a 36 y.o. female, G5P3, who presents for an annual exam. The patient  complains of vaginal odor off and on for several months.   Menstrual cycle:   LMP: Patient's last menstrual period was 11/28/2011.             Review of Systems Pertinent items are noted in HPI. Denies pelvic pain, urinary tract symptoms, vaginitis symptoms, irregular bleeding, menopausal symptoms, change in bowel habits or rectal bleeding   Objective:    BP 114/72  Pulse 78  Ht 5' 4.5" (1.638 m)  Wt 180 lb (81.647 kg)  BMI 30.42 kg/m2  LMP 11/28/2011    Wt Readings from Last 1 Encounters:  12/12/11 180 lb (81.647 kg)   Body mass index is 30.42 kg/(m^2). General Appearance: Alert, no acute distress HEENT: Grossly normal Neck / Thyroid: Supple, no thyromegaly or cervical adenopathy Lungs: Clear to auscultation bilaterally Back: No CVA tenderness Breast Exam: No masses or nodes.No dimpling, nipple retraction or discharge. Cardiovascular: Regular rate and rhythm.  Gastrointestinal: Soft, non-tender, no masses or organomegaly Pelvic Exam: EGBUS-wnl, vagina-normal rugae, cervix- without lesions or tenderness, uterus appears normal size shape and consistency, adnexae-no masses or tenderness Lymphatic Exam: Non-palpable nodes in neck, clavicular,  axillary, or inguinal regions  Skin: no rashes or abnormalities Extremities: no clubbing cyanosis or edema  Neurologic: grossly normal Psychiatric: Alert and oriented  Wet Prep: ph-5.0, whiff-positive,  many clue cells    Assessment:   Routine GYN Bacterial Vaginosis  Plan:    PAP sent     RTO 1 year or prn  Jacinda Kanady,ELMIRAPA-C

## 2011-12-13 LAB — PAP IG W/ RFLX HPV ASCU

## 2011-12-13 NOTE — Progress Notes (Signed)
CC:   Kelly Alvarado, M.D.  IDENTIFYING STATEMENT:  The a 36 year old woman with history of pulmonary embolism who presents for followup.  HISTORY OF PRESENT ILLNESS:  To summarize, the patient was diagnosed with bilateral pulmonary emboli on November 17, 2010.  She was placed on anticoagulation with heparin, eventually bridged to Coumadin for a year, discontinuing on 11/07/2011 following a CT angiogram on 11/06/2011 that had shown clear lungs, but an ill-defined hypodensity in the right upper lobe artery.  A V/Q scan showed no evidence of acute pulmonary emboli. The patient had also had a CT scan of the abdomen and pelvis that showed no evidence of malignant disease.  She has a history of multiple sclerosis.  Three weeks following discontinuation of Coumadin she had a hypercoagulable workup repeated and results are noted below.  Mrs. Gift feels well.  She has no complaints since discontinuing Coumadin. Menorrhagia has resolved.  She has no nausea, vomiting.  No loss in weight.  MEDICATIONS:  Reviewed and updated.  ALLERGIES:  Amoxicillin.  PHYSICAL EXAMINATION:  General:  Alert and oriented x3.  Vitals:  Pulse 68, blood pressure 128/81, temperature 97.5, respirations 20, weight 177 pounds.  HEENT:  Head is atraumatic, normocephalic.  Sclerae anicteric. Mouth moist.  Chest:  Clear.  Abdomen:  Soft, nontender.  Bowel sounds present.  Extremities:  No edema.  LABORATORY DATA:  12/04/2011 white cell count 2.3, hemoglobin 11.1, hematocrit 31.7, platelets 248.  Antithrombin III 109%.  Lupus anticoagulant was not detected.  Her beta 2 glycoprotein IgG, IgM, and IgA unremarkable.  Anticardiolipin IgG, IgM, and IgA unremarkable.  Protein C activity 168% and protein S activity 89%.  Negative for factor V Leiden and prothrombin gene mutation.  Factor VIII was normal at 138% and D-dimer was normal at 0.3.  IMPRESSION AND PLAN:  Mrs. Anthis is an 36 year old woman with a history of  bilateral pulmonary emboli diagnosed in October 2012.  She has multiple  sclerosis.  She completed a year of anticoagulation in October 2013.  Complete hypercoagulable panel including factor VIII and D-dimer currently unremarkable.  The patient is asymptomatic and is not willing to continue.  She was reminded that if she were to have another blood clot, anticoagulation would be lifelong.  We discussed situations that she was to avoid that would put her at high risk, such as avoiding estrogen-containing products, prolonged bed rest, long-haul flights or travel.  To avoid sitting for long periods of time.    She follows up as needed.    ______________________________ Laurice Record, M.D. LIO/MEDQ  D:  12/12/2011  T:  12/13/2011  Job:  [id number]

## 2012-03-22 ENCOUNTER — Other Ambulatory Visit: Payer: Self-pay

## 2012-08-29 IMAGING — CT CT ABD-PELV W/ CM
2 of 3 series · 17 of 46 positions shown, 19 images · IV contrast (APPLIED)
Comparison: No priors.

CLINICAL DATA: Weight loss, constipation and anemia.  Prior history
of pulmonary embolism.

CT ABDOMEN AND PELVIS WITH CONTRAST
TECHNIQUE: Multidetector CT imaging of the abdomen and pelvis was
performed following the standard protocol during bolus
administration of intravenous contrast.
Contrast: 100mL OMNIPAQUE IOHEXOL 300 MG/ML  SOLN

[Series 2: rtn a/p with · axial · 0.74mm/px · z∈[-552,-166]mm · 14 of 89 slices shown, 16 images]
[im 6/89  soft-tissue]
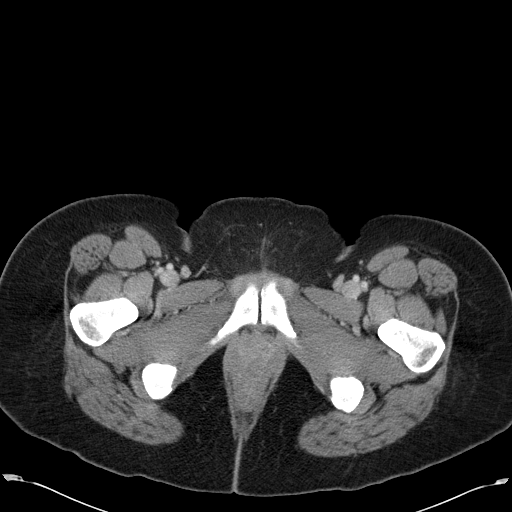
[im 6/89  bone]
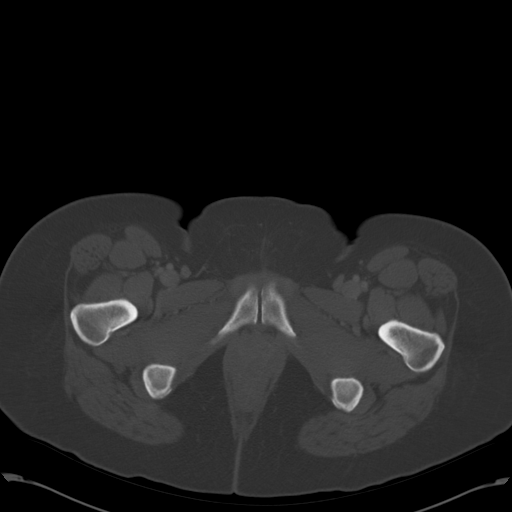
[im 12/89  soft-tissue]
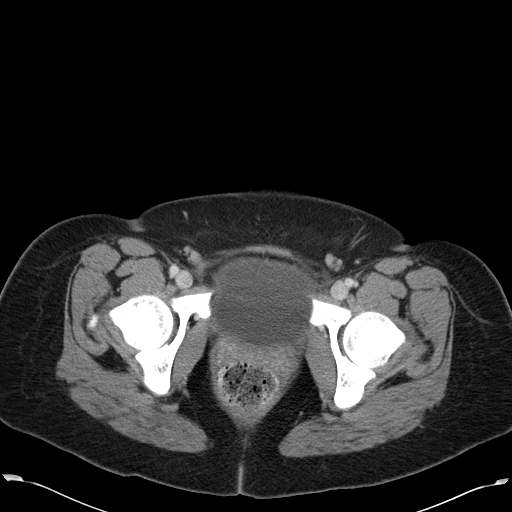
[im 18/89  soft-tissue]
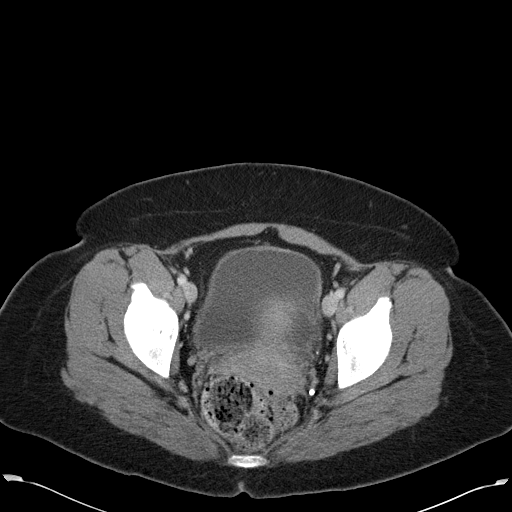
[im 23/89  soft-tissue]
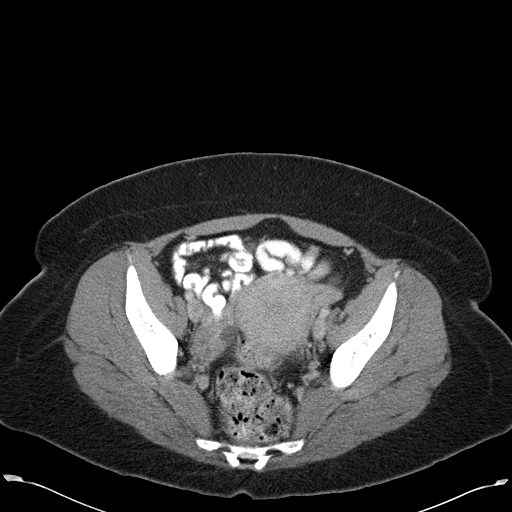
[im 29/89  soft-tissue]
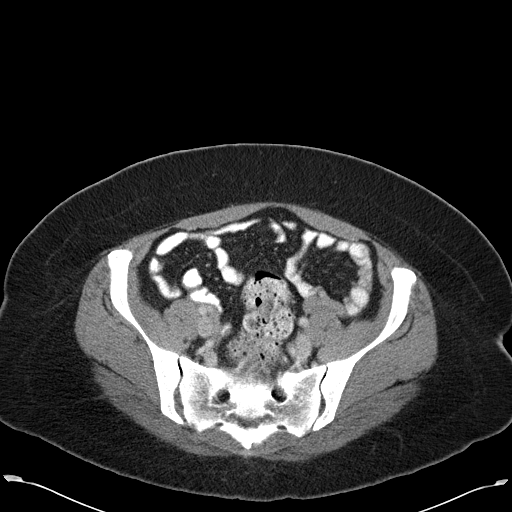
[im 35/89  soft-tissue]
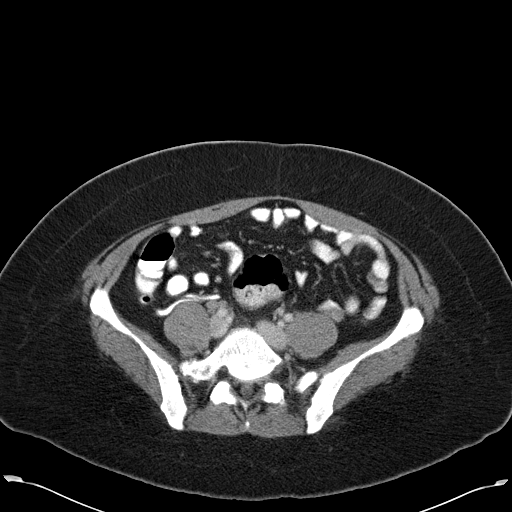
[im 40/89  soft-tissue]
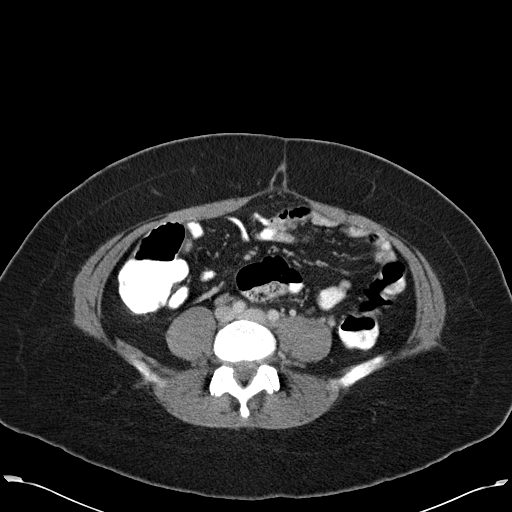
[im 49/89  soft-tissue]
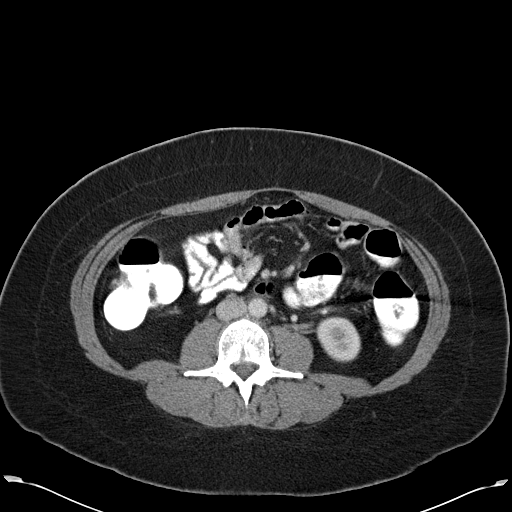
[im 54/89  soft-tissue]
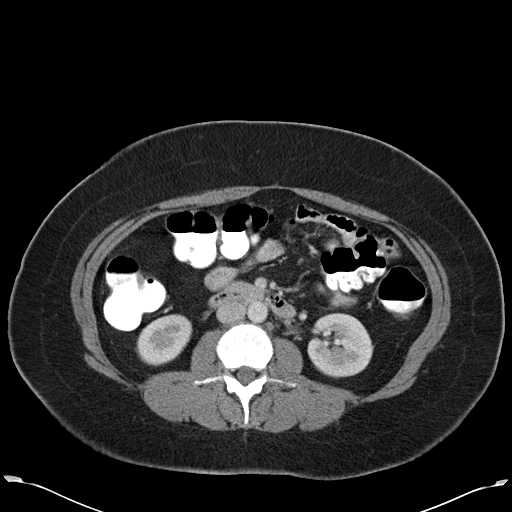
[im 54/89  bone]
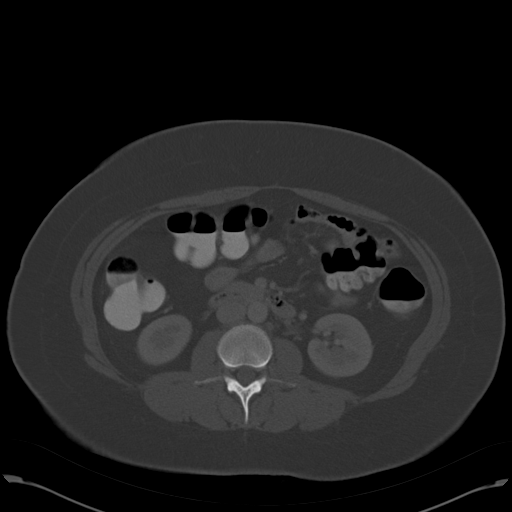
[im 60/89  soft-tissue]
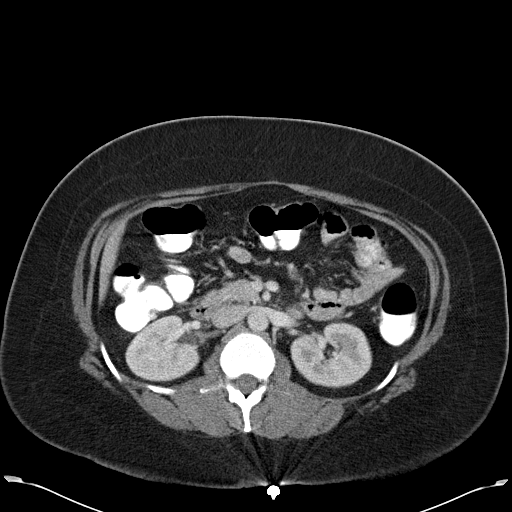
[im 66/89  soft-tissue]
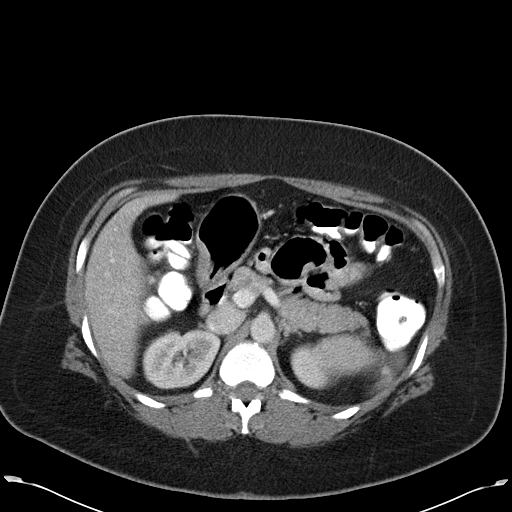
[im 71/89  soft-tissue]
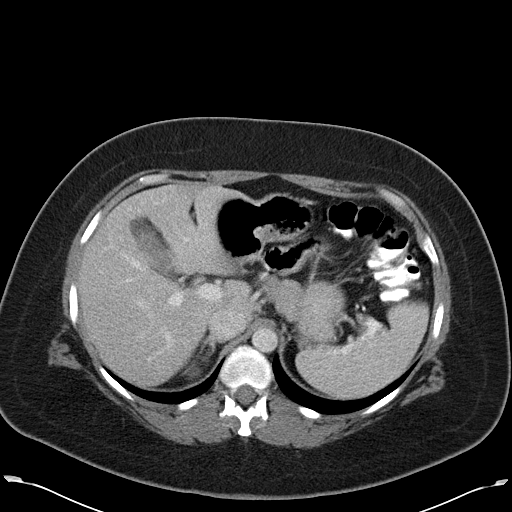
[im 77/89  soft-tissue]
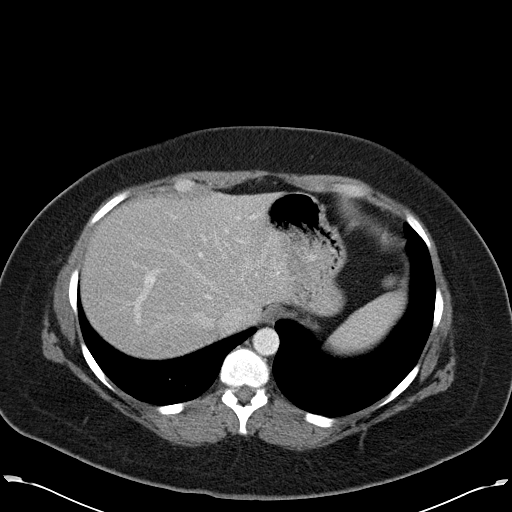
[im 83/89  soft-tissue]
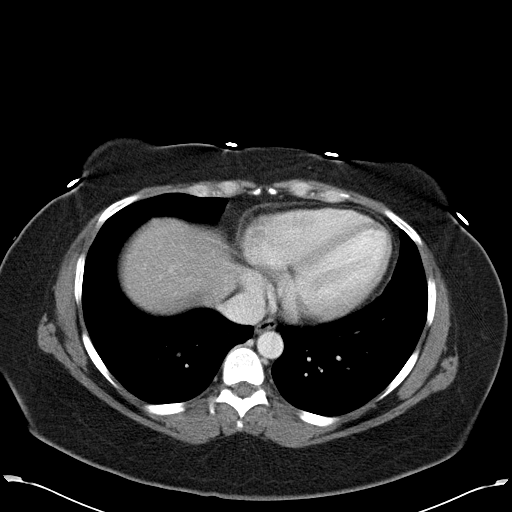

[Series 602: <mpr thick range> · coronal · 0.86mm/px · 3 of 80 slices shown]
[im 27/80  soft-tissue]
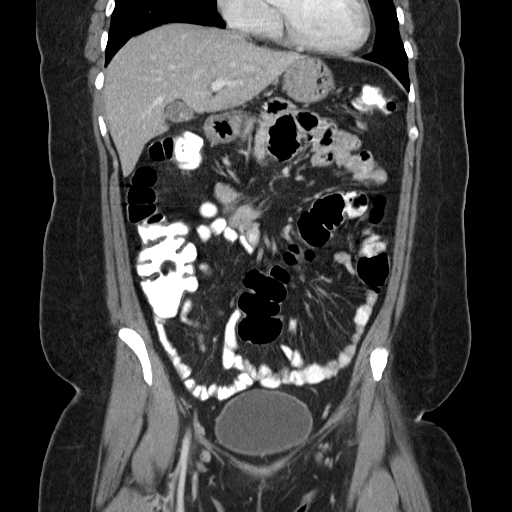
[im 36/80  soft-tissue]
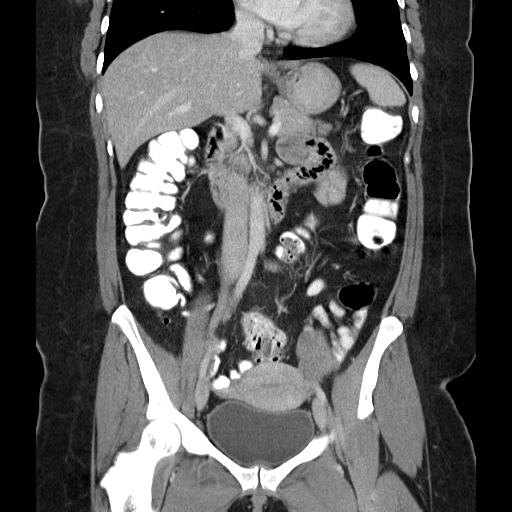
[im 44/80  soft-tissue]
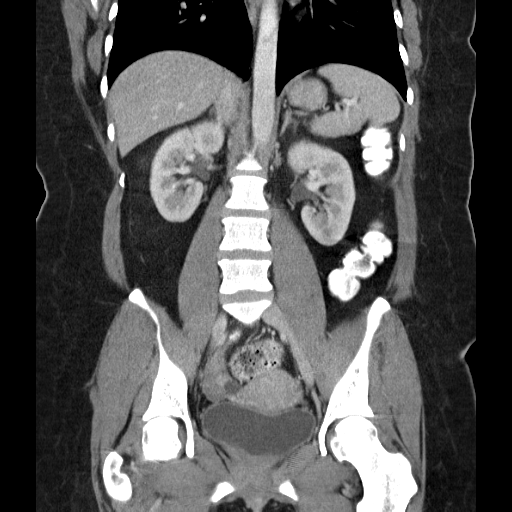

[17 of 46 positions shown; findings below may reference images not displayed]

FINDINGS: Lung Bases: Unremarkable.

Abdomen/Pelvis:  Focal perfusion anomaly adjacent to the falciform
ligament (a common and benign finding). The liver is otherwise
unremarkable in appearance.  Numerous gallstones within a
contracted gallbladder.  No signs of acute cholecystitis at this
time.  No intra or extrahepatic biliary ductal dilatation.  The
enhanced appearance of the pancreas, spleen, bilateral adrenal
glands and bilateral kidneys is unremarkable.

Normal appendix.  No ascites or pneumoperitoneum and no pathologic
distension of bowel.  No definite pathologic lymphadenopathy
identified within the abdomen or pelvis.  Uterus, bilateral ovaries
and urinary bladder are unremarkable in appearance.

Musculoskeletal: There are no aggressive appearing lytic or blastic
lesions noted in the visualized portions of the skeleton.
IMPRESSION: 1.  The gallbladder is filled with gallstones and appears
completely contracted.  No findings to suggest acute cholecystitis
at this time.
2.  No intra or extrahepatic biliary ductal dilatation to suggest
choledocholithiasis or biliary tract obstruction.
3.  Normal appendix.

## 2012-12-11 ENCOUNTER — Other Ambulatory Visit: Payer: Self-pay

## 2013-03-16 ENCOUNTER — Encounter (HOSPITAL_COMMUNITY): Payer: Self-pay | Admitting: Emergency Medicine

## 2013-03-16 ENCOUNTER — Emergency Department (HOSPITAL_COMMUNITY)
Admission: EM | Admit: 2013-03-16 | Discharge: 2013-03-16 | Disposition: A | Discharge status: Home / Self Care | Payer: BC Managed Care – PPO | Attending: Emergency Medicine | Admitting: Emergency Medicine

## 2013-03-16 ENCOUNTER — Emergency Department (HOSPITAL_COMMUNITY): Payer: BC Managed Care – PPO

## 2013-03-16 DIAGNOSIS — G35 Multiple sclerosis: Secondary | ICD-10-CM | POA: Insufficient documentation

## 2013-03-16 DIAGNOSIS — Z86711 Personal history of pulmonary embolism: Secondary | ICD-10-CM | POA: Insufficient documentation

## 2013-03-16 DIAGNOSIS — R079 Chest pain, unspecified: Secondary | ICD-10-CM | POA: Insufficient documentation

## 2013-03-16 DIAGNOSIS — R0602 Shortness of breath: Secondary | ICD-10-CM | POA: Insufficient documentation

## 2013-03-16 DIAGNOSIS — Z862 Personal history of diseases of the blood and blood-forming organs and certain disorders involving the immune mechanism: Secondary | ICD-10-CM | POA: Insufficient documentation

## 2013-03-16 DIAGNOSIS — Z79899 Other long term (current) drug therapy: Secondary | ICD-10-CM | POA: Insufficient documentation

## 2013-03-16 LAB — CBC
HCT: 29.9 % — ABNORMAL LOW (ref 36.0–46.0)
HEMOGLOBIN: 10.5 g/dL — AB (ref 12.0–15.0)
MCH: 32.4 pg (ref 26.0–34.0)
MCHC: 35.1 g/dL (ref 30.0–36.0)
MCV: 92.3 fL (ref 78.0–100.0)
Platelets: 235 10*3/uL (ref 150–400)
RBC: 3.24 MIL/uL — ABNORMAL LOW (ref 3.87–5.11)
RDW: 13.4 % (ref 11.5–15.5)
WBC: 2.8 10*3/uL — AB (ref 4.0–10.5)

## 2013-03-16 LAB — POCT I-STAT TROPONIN I: TROPONIN I, POC: 0 ng/mL (ref 0.00–0.08)

## 2013-03-16 LAB — BASIC METABOLIC PANEL
BUN: 8 mg/dL (ref 6–23)
CO2: 16 mEq/L — ABNORMAL LOW (ref 19–32)
Calcium: 8.8 mg/dL (ref 8.4–10.5)
Chloride: 108 mEq/L (ref 96–112)
Creatinine, Ser: 0.79 mg/dL (ref 0.50–1.10)
GFR calc non Af Amer: >90 mL/min (ref >90)
GLUCOSE: 82 mg/dL (ref 70–99)
POTASSIUM: 3.8 meq/L (ref 3.7–5.3)
SODIUM: 139 meq/L (ref 137–147)

## 2013-03-16 LAB — PRO B NATRIURETIC PEPTIDE: Pro B Natriuretic peptide (BNP): 17.1 pg/mL (ref 0–125)

## 2013-03-16 LAB — D-DIMER, QUANTITATIVE: D-Dimer, Quant: 0.36 ug/mL-FEU (ref 0.00–0.48)

## 2013-03-16 MED ORDER — OXYCODONE-ACETAMINOPHEN 5-325 MG PO TABS
2.0000 | ORAL_TABLET | Freq: Once | ORAL | Status: AC
  Administered 2013-03-16: 2 via ORAL
  Filled 2013-03-16: qty 2

## 2013-03-16 MED ORDER — NAPROXEN 500 MG PO TABS: 500.0000 mg | ORAL_TABLET | Freq: Two times a day (BID) | ORAL | Status: DC

## 2013-03-16 NOTE — ED Notes (Signed)
Pt has history of pulmonary embolus 2 years ago and no longer takes blood thinner.  Today with sob, chest pain, and cannot get a deep breath.

## 2013-03-16 NOTE — ED Provider Notes (Signed)
CSN: [id number]     Arrival date & time 03/16/13  0716 History   First MD Initiated Contact with Patient 03/16/13 0840     Chief Complaint  Patient presents with  . Chest Pain  . Shortness of Breath     (Consider location/radiation/quality/duration/timing/severity/associated sxs/prior Treatment) Patient is a 38 y.o. female presenting with chest pain and shortness of breath.  Chest Pain Associated symptoms: shortness of breath   Shortness of Breath Associated symptoms: chest pain    Pt with history of MS reports several days of moderate aching L sided chest pain, getting worse. Unable to sleep on her L side due to pain, some mild SOB worse with lying on her stomach. Mild cough recently but no sputum and no fever. She has reported history of PE in 2012 but CTA done a year later showed similar findings and felt to be artifactual. V/Q done then was normal. She is no longer on any anticoagulation. Pain is worse with movement of L arm.   Past Medical History  Diagnosis Date  . Allergy   . Anemia     with pregnancy  . Pulmonary embolism   . Neuromuscular disorder     MS  . Multiple sclerosis    Past Surgical History  Procedure Laterality Date  . Breast reduction surgery    . Wisdom tooth extraction     Family History  Problem Relation Age of Onset  . Cancer Paternal Uncle   . Cancer Paternal Grandfather    History  Substance Use Topics  . Smoking status: Never Smoker   . Smokeless tobacco: Never Used  . Alcohol Use: No   OB History   Grav Para Term Preterm Abortions TAB SAB Ect Mult Living   5 3        3      Review of Systems  Respiratory: Positive for shortness of breath.   Cardiovascular: Positive for chest pain.   All other systems reviewed and are negative except as noted in HPI.     Allergies  Amoxicillin  Home Medications   Current Outpatient Rx  Name  Route  Sig  Dispense  Refill  . Fingolimod HCl (GILENYA) 0.5 MG CAPS   Oral   Take 1 capsule by mouth  daily.         Marland Kitchen HYDROcodone-acetaminophen (NORCO) 5-325 MG per tablet   Oral   Take 1 tablet by mouth every 4 (four) hours as needed.          Marland Kitchen levocetirizine (XYZAL) 5 MG tablet   Oral   Take 5 mg by mouth every evening.         . topiramate (TOPAMAX) 100 MG tablet   Oral   Take 100 mg by mouth daily.           BP 111/63  Pulse 69  Temp(Src) 98.2 F (36.8 C) (Oral)  Resp 19  Wt 180 lb (81.647 kg)  SpO2 100%  LMP 03/15/2013 Physical Exam  Nursing note and vitals reviewed. Constitutional: She is oriented to person, place, and time. She appears well-developed and well-nourished.  HENT:  Head: Normocephalic and atraumatic.  Eyes: EOM are normal. Pupils are equal, round, and reactive to light.  Neck: Normal range of motion. Neck supple.  Cardiovascular: Normal rate, normal heart sounds and intact distal pulses.   Pulmonary/Chest: Effort normal and breath sounds normal. She has no wheezes. She has no rales. She exhibits no tenderness.  Abdominal: Bowel sounds are normal. She exhibits  no distension. There is no tenderness.  Musculoskeletal: Normal range of motion. She exhibits no edema and no tenderness.  Neurological: She is alert and oriented to person, place, and time. She has normal strength. No cranial nerve deficit or sensory deficit.  Skin: Skin is warm and dry. No rash noted.  Psychiatric: She has a normal mood and affect.    ED Course  Procedures (including critical care time) Labs Review Labs Reviewed  CBC - Abnormal; Notable for the following:    WBC 2.8 (*)    RBC 3.24 (*)    Hemoglobin 10.5 (*)    HCT 29.9 (*)    All other components within normal limits  BASIC METABOLIC PANEL - Abnormal; Notable for the following:    CO2 16 (*)    All other components within normal limits  PRO B NATRIURETIC PEPTIDE  D-DIMER, QUANTITATIVE  POCT I-STAT TROPONIN I   Imaging Review Dg Chest 2 View  03/16/2013   CLINICAL DATA:  Chest pain, tightness  EXAM: CHEST   2 VIEW  COMPARISON:  11/07/2011  FINDINGS: Cardiomediastinal silhouette is unremarkable. No acute infiltrate or pleural effusion. No pulmonary edema. Bony thorax is unremarkable.  IMPRESSION: No active cardiopulmonary disease.   Electronically Signed   By: Lahoma Crocker M.D.   On: 03/16/2013 08:19    EKG Interpretation    Date/Time:  Monday March 16 2013 07:21:22 EST Ventricular Rate:  83 PR Interval:  156 QRS Duration: 86 QT Interval:  352 QTC Calculation: 413 R Axis:   56 Text Interpretation:  Normal sinus rhythm Cannot rule out Anterior infarct , age undetermined Abnormal ECG No significant change since last tracing Confirmed by [facility]  MD, Brallan Denio [phone]) on 03/16/2013 10:06:08 AM            MDM   Final diagnoses:  Chest pain    Labs and imaging ordered in triage reveiwed and normal. Given multiple prior CT and questionable history of PE, added Dimer which is neg. Low suspicion of PE. Pt takes Vicodin on a regular basis.     Verlon Carcione B. Karle Starch, MD 03/16/13 1536

## 2013-03-16 NOTE — ED Notes (Signed)
Patient transported to X-ray 

## 2013-03-16 NOTE — ED Notes (Signed)
PT reports some discomfort over past few days and has been uncomfortable lying on L side. The PT reports acute pain 9/10 onset this AM while brushing teeth. Reports tightness in chest, feeling more discomfort and tightness w/ inspiration, denies radiation of pain. Reports lightheadedness when leaving house today and when raising arms above head for chx. Denies n/v. Reports hx of PE, stating this discomfort feels similar to when she had PE (2013). PT denies being on blood thinners at this time.

## 2013-03-16 NOTE — Discharge Instructions (Signed)
Chest Pain (Nonspecific) °It is often hard to give a specific diagnosis for the cause of chest pain. There is always a chance that your pain could be related to something serious, such as a heart attack or a blood clot in the lungs. You need to follow up with your caregiver for further evaluation. °CAUSES  °· Heartburn. °· Pneumonia or bronchitis. °· Anxiety or stress. °· Inflammation around your heart (pericarditis) or lung (pleuritis or pleurisy). °· A blood clot in the lung. °· A collapsed lung (pneumothorax). It can develop suddenly on its own (spontaneous pneumothorax) or from injury (trauma) to the chest. °· Shingles infection (herpes zoster virus). °The chest wall is composed of bones, muscles, and cartilage. Any of these can be the source of the pain. °· The bones can be bruised by injury. °· The muscles or cartilage can be strained by coughing or overwork. °· The cartilage can be affected by inflammation and become sore (costochondritis). °DIAGNOSIS  °Lab tests or other studies, such as X-rays, electrocardiography, stress testing, or cardiac imaging, may be needed to find the cause of your pain.  °TREATMENT  °· Treatment depends on what may be causing your chest pain. Treatment may include: °· Acid blockers for heartburn. °· Anti-inflammatory medicine. °· Pain medicine for inflammatory conditions. °· Antibiotics if an infection is present. °· You may be advised to change lifestyle habits. This includes stopping smoking and avoiding alcohol, caffeine, and chocolate. °· You may be advised to keep your head raised (elevated) when sleeping. This reduces the chance of acid going backward from your stomach into your esophagus. °· Most of the time, nonspecific chest pain will improve within 2 to 3 days with rest and mild pain medicine. °HOME CARE INSTRUCTIONS  °· If antibiotics were prescribed, take your antibiotics as directed. Finish them even if you start to feel better. °· For the next few days, avoid physical  activities that bring on chest pain. Continue physical activities as directed. °· Do not smoke. °· Avoid drinking alcohol. °· Only take over-the-counter or prescription medicine for pain, discomfort, or fever as directed by your caregiver. °· Follow your caregiver's suggestions for further testing if your chest pain does not go away. °· Keep any follow-up appointments you made. If you do not go to an appointment, you could develop lasting (chronic) problems with pain. If there is any problem keeping an appointment, you must call to reschedule. °SEEK MEDICAL CARE IF:  °· You think you are having problems from the medicine you are taking. Read your medicine instructions carefully. °· Your chest pain does not go away, even after treatment. °· You develop a rash with blisters on your chest. °SEEK IMMEDIATE MEDICAL CARE IF:  °· You have increased chest pain or pain that spreads to your arm, neck, jaw, back, or abdomen. °· You develop shortness of breath, an increasing cough, or you are coughing up blood. °· You have severe back or abdominal pain, feel nauseous, or vomit. °· You develop severe weakness, fainting, or chills. °· You have a fever. °THIS IS AN EMERGENCY. Do not wait to see if the pain will go away. Get medical help at once. Call your local emergency services (911 in [place] Do not drive yourself to the hospital. °MAKE SURE YOU:  °· Understand these instructions. °· Will watch your condition. °· Will get help right away if you are not doing well or get worse. °Document Released: 11/01/2004 Document Revised: 04/16/2011 Document Reviewed: 08/28/2007 °Exit[employer]® Patient Information ©2014 [employer]   [employer]. ° °

## 2013-09-16 ENCOUNTER — Encounter (INDEPENDENT_AMBULATORY_CARE_PROVIDER_SITE_OTHER): Payer: Self-pay | Admitting: General Surgery

## 2013-09-16 ENCOUNTER — Ambulatory Visit (INDEPENDENT_AMBULATORY_CARE_PROVIDER_SITE_OTHER): Payer: BC Managed Care – PPO | Admitting: General Surgery

## 2013-09-16 VITALS — BP 120/66 | HR 70 | Temp 98.5°F | Ht 64.0 in | Wt 186.0 lb

## 2013-09-16 DIAGNOSIS — D249 Benign neoplasm of unspecified breast: Secondary | ICD-10-CM

## 2013-09-16 DIAGNOSIS — D242 Benign neoplasm of left breast: Secondary | ICD-10-CM

## 2013-09-16 NOTE — Progress Notes (Signed)
Patient ID: Kelly Alvarado, female   DOB: Feb 09, 1975, 38 y.o.   MRN: [id number]  Chief Complaint  Patient presents with  . breast papilloma    HPI Kelly Alvarado is a 38 y.o. female.   HPI  She is referred by Dr. Joanell Alvarado because of a sclerosed papilloma of the left breast. She had a screening mammogram and an area of duct ectasia was noted. Ultrasound verified this. Image guided biopsy demonstrated the above pathology. She's here to discuss wider excision. There is no family history of breast cancer. Age at menarche was before the age of 65. Age at first live birth was 46. She is still having menstrual periods.  No breast masses, nipple discharge, or breast pain.  Past Medical History  Diagnosis Date  . Allergy   . Anemia     with pregnancy  . Pulmonary embolism   . Neuromuscular disorder     MS  . Multiple sclerosis     Past Surgical History  Procedure Laterality Date  . Breast reduction surgery    . Wisdom tooth extraction      Family History  Problem Relation Age of Onset  . Cancer Paternal Uncle   . Cancer Paternal Grandfather     Social History History  Substance Use Topics  . Smoking status: Never Smoker   . Smokeless tobacco: Never Used  . Alcohol Use: No    Allergies  Allergen Reactions  . Amoxicillin Hives    Current Outpatient Prescriptions  Medication Sig Dispense Refill  . Fingolimod HCl (GILENYA) 0.5 MG CAPS Take 1 capsule by mouth daily.      Marland Kitchen HYDROcodone-acetaminophen (NORCO) 5-325 MG per tablet Take 1 tablet by mouth every 4 (four) hours as needed.       Marland Kitchen levocetirizine (XYZAL) 5 MG tablet Take 5 mg by mouth every evening.      . topiramate (TOPAMAX) 100 MG tablet Take 100 mg by mouth daily.       . naproxen (NAPROSYN) 500 MG tablet Take 1 tablet (500 mg total) by mouth 2 (two) times daily.  30 tablet  0   No current facility-administered medications for this visit.    Review of Systems Review of Systems  Constitutional: Negative.    Respiratory: Negative.   Cardiovascular: Negative.   Gastrointestinal: Negative.   Genitourinary: Negative.   Musculoskeletal: Negative.   Neurological: Positive for headaches.  Hematological: Negative.     Blood pressure 120/66, pulse 70, temperature 98.5 F (36.9 C), temperature source Oral, height 5\' 4"  (1.626 m), weight 186 lb (84.369 kg).  Physical Exam Physical Exam  Constitutional:  Overweight female  HENT:  Head: Normocephalic and atraumatic.  Neck: Neck supple.  Cardiovascular: Normal rate and regular rhythm.   Pulmonary/Chest: Effort normal and breath sounds normal.  Bilateral inverted T. Breast scars. No palpable breast masses or nipple discharge.  Lymphadenopathy:    She has no cervical adenopathy.  Neurological: She is alert.  Skin: Skin is warm and dry.  Psychiatric: She has a normal mood and affect. Her behavior is normal.    Data Reviewed Mammogram. Ultrasound. Pathology report.  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     Plan    Radioactive seed localized left breast lumpectomy. I have explained the procedure, risks, and aftercare to her.  Risks include but are not limited to bleeding, infection, wound problems, seroma formation, deformity, anesthesia.  She  seems to understand and agrees with the plan.       Kelly Alvarado J 09/16/2013, 1:09 PM

## 2013-09-16 NOTE — Patient Instructions (Signed)
[place] Office Phone Number [phone]  BREAST BIOPSY/ PARTIAL MASTECTOMY: POST OP INSTRUCTIONS  Always review your discharge instruction sheet given to you by the facility where your surgery was performed.  IF YOU HAVE DISABILITY OR FAMILY LEAVE FORMS, YOU MUST BRING THEM TO THE OFFICE FOR PROCESSING.  DO NOT GIVE THEM TO YOUR DOCTOR.  1. A prescription for pain medication may be given to you upon discharge.  Take your pain medication as prescribed, if needed.  If narcotic pain medicine is not needed, then you may take acetaminophen (Tylenol) or ibuprofen (Advil) as needed. 2. Take your usually prescribed medications unless otherwise directed 3. If you need a refill on your pain medication, please contact your pharmacy.  They will contact our office to request authorization.  Prescriptions will not be filled after 5pm or on week-ends. 4. You should eat very light the first 24 hours after surgery, such as soup, crackers, pudding, etc.  Resume your normal diet the day after surgery. 5. Most patients will experience some swelling and bruising in the breast.  Ice packs and a good support bra will help.  Swelling and bruising can take several days to resolve.  6. It is common to experience some constipation if taking pain medication after surgery.  Increasing fluid intake and taking a stool softener will usually help or prevent this problem from occurring.  A mild laxative (Milk of Magnesia or Miralax) should be taken according to package directions if there are no bowel movements after 48 hours. 7. Unless discharge instructions indicate otherwise, you may remove your bandages 48 hours after surgery, and you may shower at that time.  You may have steri-strips (small skin tapes) in place directly over the incision.  These strips should be left on the skin for 7-10 days.  If your surgeon used skin glue on the incision, you may shower in 24 hours.  The glue will flake off over the next  2-3 weeks.  Any sutures or staples will be removed at the office during your follow-up visit. 8. ACTIVITIES:  You may resume regular daily activities (gradually increasing) beginning the next day.  Wearing a good support bra or sports bra minimizes pain and swelling.  You may have sexual intercourse when it is comfortable. a. You may drive when you no longer are taking prescription pain medication, you can comfortably wear a seatbelt, and you can safely maneuver your car and apply brakes. b. RETURN TO WORK:  ______________________________________________________________________________________ 9. You should see your doctor in the office for a follow-up appointment approximately two weeks after your surgery.  Your doctor's nurse will typically make your follow-up appointment when she calls you with your pathology report.  Expect your pathology report 2-3 business days after your surgery.  You may call to check if you do not hear from [place] after three days. 10. OTHER INSTRUCTIONS: _______________________________________________________________________________________________ _____________________________________________________________________________________________________________________________________ _____________________________________________________________________________________________________________________________________ _____________________________________________________________________________________________________________________________________  WHEN TO CALL YOUR DOCTOR: 1. Fever over 101.0 2. Nausea and/or vomiting. 3. Extreme swelling or bruising. 4. Continued bleeding from incision. 5. Increased pain, redness, or drainage from the incision.  The clinic staff is available to answer your questions during regular business hours.  Please don't hesitate to call and ask to speak to one of the nurses for clinical concerns.  If you have a medical emergency, go to the nearest emergency  room or call 911.  A surgeon from [facility] is always on call at the hospital.  For further questions, please visit [email or web address]

## 2013-09-30 ENCOUNTER — Encounter (HOSPITAL_BASED_OUTPATIENT_CLINIC_OR_DEPARTMENT_OTHER): Payer: Self-pay | Admitting: *Deleted

## 2013-09-30 NOTE — Progress Notes (Signed)
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

## 2013-10-05 ENCOUNTER — Encounter (HOSPITAL_BASED_OUTPATIENT_CLINIC_OR_DEPARTMENT_OTHER): Payer: Self-pay | Admitting: *Deleted

## 2013-10-06 ENCOUNTER — Ambulatory Visit (HOSPITAL_BASED_OUTPATIENT_CLINIC_OR_DEPARTMENT_OTHER)
Admission: RE | Admit: 2013-10-06 | Discharge: 2013-10-06 | Disposition: A | Discharge status: Home / Self Care | Payer: BC Managed Care – PPO | Source: Ambulatory Visit | Attending: General Surgery | Admitting: General Surgery

## 2013-10-06 ENCOUNTER — Encounter (HOSPITAL_BASED_OUTPATIENT_CLINIC_OR_DEPARTMENT_OTHER): Payer: BC Managed Care – PPO | Admitting: Anesthesiology

## 2013-10-06 ENCOUNTER — Ambulatory Visit (HOSPITAL_COMMUNITY): Payer: BC Managed Care – PPO

## 2013-10-06 ENCOUNTER — Encounter (HOSPITAL_BASED_OUTPATIENT_CLINIC_OR_DEPARTMENT_OTHER)
Admission: RE | Disposition: A | Discharge status: Home / Self Care | Payer: Self-pay | Source: Ambulatory Visit | Attending: General Surgery

## 2013-10-06 ENCOUNTER — Encounter (HOSPITAL_BASED_OUTPATIENT_CLINIC_OR_DEPARTMENT_OTHER): Payer: Self-pay | Admitting: *Deleted

## 2013-10-06 ENCOUNTER — Ambulatory Visit (HOSPITAL_BASED_OUTPATIENT_CLINIC_OR_DEPARTMENT_OTHER): Payer: BC Managed Care – PPO | Admitting: Anesthesiology

## 2013-10-06 DIAGNOSIS — D249 Benign neoplasm of unspecified breast: Secondary | ICD-10-CM | POA: Diagnosis present

## 2013-10-06 DIAGNOSIS — Z809 Family history of malignant neoplasm, unspecified: Secondary | ICD-10-CM | POA: Insufficient documentation

## 2013-10-06 DIAGNOSIS — G35 Multiple sclerosis: Secondary | ICD-10-CM | POA: Insufficient documentation

## 2013-10-06 HISTORY — PX: BREAST LUMPECTOMY WITH RADIOACTIVE SEED LOCALIZATION: SHX6424

## 2013-10-06 HISTORY — DX: Presence of spectacles and contact lenses: Z97.3

## 2013-10-06 HISTORY — DX: Headache: R51

## 2013-10-06 LAB — POCT HEMOGLOBIN-HEMACUE: HEMOGLOBIN: 13.6 g/dL (ref 12.0–15.0)

## 2013-10-06 SURGERY — BREAST LUMPECTOMY WITH RADIOACTIVE SEED LOCALIZATION
Anesthesia: General | Site: Breast | Laterality: Left

## 2013-10-06 MED ORDER — MIDAZOLAM HCL 2 MG/2ML IJ SOLN
INTRAMUSCULAR | Status: AC
  Filled 2013-10-06: qty 2

## 2013-10-06 MED ORDER — LIDOCAINE HCL (CARDIAC) 20 MG/ML IV SOLN
INTRAVENOUS | Status: DC | PRN
  Administered 2013-10-06: 50 mg via INTRAVENOUS

## 2013-10-06 MED ORDER — HYDROMORPHONE HCL PF 1 MG/ML IJ SOLN
INTRAMUSCULAR | Status: AC
  Filled 2013-10-06: qty 1

## 2013-10-06 MED ORDER — LIDOCAINE HCL (PF) 1 % IJ SOLN
INTRAMUSCULAR | Status: AC
  Filled 2013-10-06: qty 30

## 2013-10-06 MED ORDER — OXYCODONE HCL 5 MG PO TABS: 5.0000 mg | ORAL_TABLET | ORAL | Status: DC | PRN

## 2013-10-06 MED ORDER — DEXAMETHASONE SODIUM PHOSPHATE 4 MG/ML IJ SOLN
INTRAMUSCULAR | Status: DC | PRN
  Administered 2013-10-06: 10 mg via INTRAVENOUS

## 2013-10-06 MED ORDER — FENTANYL CITRATE 0.05 MG/ML IJ SOLN
INTRAMUSCULAR | Status: DC | PRN
  Administered 2013-10-06: 100 ug via INTRAVENOUS

## 2013-10-06 MED ORDER — VANCOMYCIN HCL IN DEXTROSE 1-5 GM/200ML-% IV SOLN
INTRAVENOUS | Status: AC
  Filled 2013-10-06: qty 200

## 2013-10-06 MED ORDER — OXYCODONE HCL 5 MG/5ML PO SOLN: 5.0000 mg | Freq: Once | ORAL | Status: DC | PRN

## 2013-10-06 MED ORDER — HYDROMORPHONE HCL PF 1 MG/ML IJ SOLN
0.2500 mg | INTRAMUSCULAR | Status: DC | PRN
  Administered 2013-10-06: 0.5 mg via INTRAVENOUS

## 2013-10-06 MED ORDER — PROPOFOL 10 MG/ML IV BOLUS
INTRAVENOUS | Status: DC | PRN
  Administered 2013-10-06: 200 mg via INTRAVENOUS

## 2013-10-06 MED ORDER — OXYCODONE HCL 5 MG PO TABS: 5.0000 mg | ORAL_TABLET | Freq: Once | ORAL | Status: DC | PRN

## 2013-10-06 MED ORDER — LACTATED RINGERS IV SOLN
INTRAVENOUS | Status: DC
  Administered 2013-10-06 (×2): via INTRAVENOUS

## 2013-10-06 MED ORDER — BUPIVACAINE HCL (PF) 0.5 % IJ SOLN
INTRAMUSCULAR | Status: DC | PRN
  Administered 2013-10-06: 10 mL

## 2013-10-06 MED ORDER — ONDANSETRON HCL 4 MG/2ML IJ SOLN: 4.0000 mg | Freq: Once | INTRAMUSCULAR | Status: DC | PRN

## 2013-10-06 MED ORDER — MIDAZOLAM HCL 2 MG/2ML IJ SOLN: 1.0000 mg | INTRAMUSCULAR | Status: DC | PRN

## 2013-10-06 MED ORDER — VANCOMYCIN HCL IN DEXTROSE 1-5 GM/200ML-% IV SOLN
1000.0000 mg | INTRAVENOUS | Status: AC
Start: 2013-10-06 — End: 2013-10-06
  Administered 2013-10-06: 1000 mg via INTRAVENOUS

## 2013-10-06 MED ORDER — FENTANYL CITRATE 0.05 MG/ML IJ SOLN
INTRAMUSCULAR | Status: AC
  Filled 2013-10-06: qty 6

## 2013-10-06 MED ORDER — EPHEDRINE SULFATE 50 MG/ML IJ SOLN
INTRAMUSCULAR | Status: DC | PRN
  Administered 2013-10-06: 10 mg via INTRAVENOUS

## 2013-10-06 MED ORDER — BUPIVACAINE HCL (PF) 0.5 % IJ SOLN
INTRAMUSCULAR | Status: AC
  Filled 2013-10-06: qty 30

## 2013-10-06 MED ORDER — FENTANYL CITRATE 0.05 MG/ML IJ SOLN: 50.0000 ug | INTRAMUSCULAR | Status: DC | PRN

## 2013-10-06 MED ORDER — SODIUM BICARBONATE 4 % IV SOLN
INTRAVENOUS | Status: AC
  Filled 2013-10-06: qty 5

## 2013-10-06 SURGICAL SUPPLY — 51 items
APPLIER CLIP 9.375 MED OPEN (MISCELLANEOUS)
BENZOIN TINCTURE PRP APPL 2/3 (GAUZE/BANDAGES/DRESSINGS) ×3 IMPLANT
BINDER BREAST LRG (GAUZE/BANDAGES/DRESSINGS) IMPLANT
BINDER BREAST MEDIUM (GAUZE/BANDAGES/DRESSINGS) IMPLANT
BINDER BREAST XLRG (GAUZE/BANDAGES/DRESSINGS) ×3 IMPLANT
BINDER BREAST XXLRG (GAUZE/BANDAGES/DRESSINGS) IMPLANT
BLADE SURG 15 STRL LF DISP TIS (BLADE) ×1 IMPLANT
BLADE SURG 15 STRL SS (BLADE) ×2
CANISTER SUC SOCK COL 7IN (MISCELLANEOUS) IMPLANT
CANISTER SUCT 1200ML W/VALVE (MISCELLANEOUS) IMPLANT
CHLORAPREP W/TINT 26ML (MISCELLANEOUS) ×3 IMPLANT
CLIP APPLIE 9.375 MED OPEN (MISCELLANEOUS) IMPLANT
CLOSURE WOUND 1/2 X4 (GAUZE/BANDAGES/DRESSINGS) ×1
COVER MAYO STAND STRL (DRAPES) ×3 IMPLANT
COVER PROBE W GEL 5X96 (DRAPES) ×3 IMPLANT
COVER TABLE BACK 60X90 (DRAPES) ×3 IMPLANT
DECANTER SPIKE VIAL GLASS SM (MISCELLANEOUS) IMPLANT
DEVICE DUBIN W/COMP PLATE 8390 (MISCELLANEOUS) ×3 IMPLANT
DRAPE PED LAPAROTOMY (DRAPES) ×3 IMPLANT
DRAPE UTILITY XL STRL (DRAPES) ×3 IMPLANT
ELECT COATED BLADE 2.86 ST (ELECTRODE) ×3 IMPLANT
ELECT REM PT RETURN 9FT ADLT (ELECTROSURGICAL) ×3
ELECTRODE REM PT RTRN 9FT ADLT (ELECTROSURGICAL) ×1 IMPLANT
GAUZE SPONGE 4X4 12PLY STRL (GAUZE/BANDAGES/DRESSINGS) ×3 IMPLANT
GLOVE BIOGEL PI IND STRL 7.0 (GLOVE) ×1 IMPLANT
GLOVE BIOGEL PI IND STRL 8.5 (GLOVE) ×1 IMPLANT
GLOVE BIOGEL PI INDICATOR 7.0 (GLOVE) ×2
GLOVE BIOGEL PI INDICATOR 8.5 (GLOVE) ×2
GLOVE ECLIPSE 7.0 STRL STRAW (GLOVE) ×3 IMPLANT
GLOVE ECLIPSE 8.0 STRL XLNG CF (GLOVE) ×3 IMPLANT
GLOVE SURG SIGNA 7.5 PF LTX (GLOVE) ×3 IMPLANT
GOWN STRL REUS W/ TWL LRG LVL3 (GOWN DISPOSABLE) ×2 IMPLANT
GOWN STRL REUS W/TWL LRG LVL3 (GOWN DISPOSABLE) ×4
KIT MARKER MARGIN INK (KITS) IMPLANT
NEEDLE HYPO 25X1 1.5 SAFETY (NEEDLE) ×3 IMPLANT
NS IRRIG 1000ML POUR BTL (IV SOLUTION) ×3 IMPLANT
PACK BASIN DAY SURGERY FS (CUSTOM PROCEDURE TRAY) ×3 IMPLANT
PENCIL BUTTON HOLSTER BLD 10FT (ELECTRODE) ×3 IMPLANT
SLEEVE SCD COMPRESS KNEE MED (MISCELLANEOUS) ×3 IMPLANT
SPONGE GAUZE 4X4 12PLY STER LF (GAUZE/BANDAGES/DRESSINGS) ×3 IMPLANT
SPONGE LAP 4X18 X RAY DECT (DISPOSABLE) ×3 IMPLANT
STRIP CLOSURE SKIN 1/2X4 (GAUZE/BANDAGES/DRESSINGS) ×2 IMPLANT
SUT MON AB 4-0 PC3 18 (SUTURE) ×3 IMPLANT
SUT SILK 2 0 FS (SUTURE) ×3 IMPLANT
SUT VICRYL 3-0 CR8 SH (SUTURE) ×3 IMPLANT
SYR CONTROL 10ML LL (SYRINGE) ×3 IMPLANT
TOWEL OR 17X24 6PK STRL BLUE (TOWEL DISPOSABLE) ×6 IMPLANT
TOWEL OR NON WOVEN STRL DISP B (DISPOSABLE) ×3 IMPLANT
TUBE CONNECTING 20'X1/4 (TUBING) ×1
TUBE CONNECTING 20X1/4 (TUBING) ×2 IMPLANT
YANKAUER SUCT BULB TIP NO VENT (SUCTIONS) ×3 IMPLANT

## 2013-10-06 NOTE — Interval H&P Note (Signed)
History and Physical Interval Note:  10/06/2013 8:59 AM  Kelly Alvarado  has presented today for surgery, with the diagnosis of sclerosing papilloma left breast  The various methods of treatment have been discussed with the patient and family. After consideration of risks, benefits and other options for treatment, the patient has consented to  Procedure(s):  RADIOACTIVE SEED LOCALIZATION LEFT BREAST LUMPECTOMY (Left) as a surgical intervention .  The patient's history has been reviewed, patient examined, no change in status, stable for surgery.  I have reviewed the patient's chart and labs.  Questions were answered to the patient's satisfaction.     Alroy Portela Lenna Sciara

## 2013-10-06 NOTE — Anesthesia Postprocedure Evaluation (Signed)
  Anesthesia Post-op Note  Patient: Kelly Alvarado  Procedure(s) Performed: Procedure(s):  RADIOACTIVE SEED LOCALIZATION LEFT BREAST LUMPECTOMY (Left)  Patient Location: PACU  Anesthesia Type:General  Level of Consciousness: awake, alert  and oriented  Airway and Oxygen Therapy: Patient Spontanous Breathing  Post-op Pain: none  Post-op Assessment: Post-op Vital signs reviewed  Post-op Vital Signs: Reviewed  Last Vitals:  Filed Vitals:   10/06/13 1118  BP: 140/69  Pulse: 67  Temp: 36.8 C  Resp: 16    Complications: No apparent anesthesia complications

## 2013-10-06 NOTE — H&P (View-Only) (Signed)
Patient ID: Kelly Alvarado, female   DOB: 1975-08-26, 38 y.o.   MRN: [id number]  Chief Complaint  Patient presents with  . breast papilloma    HPI Kelly Alvarado is a 38 y.o. female.   HPI  She is referred by Dr. Joanell Rising because of a sclerosed papilloma of the left breast. She had a screening mammogram and an area of duct ectasia was noted. Ultrasound verified this. Image guided biopsy demonstrated the above pathology. She's here to discuss wider excision. There is no family history of breast cancer. Age at menarche was before the age of 76. Age at first live birth was 89. She is still having menstrual periods.  No breast masses, nipple discharge, or breast pain.  Past Medical History  Diagnosis Date  . Allergy   . Anemia     with pregnancy  . Pulmonary embolism   . Neuromuscular disorder     MS  . Multiple sclerosis     Past Surgical History  Procedure Laterality Date  . Breast reduction surgery    . Wisdom tooth extraction      Family History  Problem Relation Age of Onset  . Cancer Paternal Uncle   . Cancer Paternal Grandfather     Social History History  Substance Use Topics  . Smoking status: Never Smoker   . Smokeless tobacco: Never Used  . Alcohol Use: No    Allergies  Allergen Reactions  . Amoxicillin Hives    Current Outpatient Prescriptions  Medication Sig Dispense Refill  . Fingolimod HCl (GILENYA) 0.5 MG CAPS Take 1 capsule by mouth daily.      Marland Kitchen HYDROcodone-acetaminophen (NORCO) 5-325 MG per tablet Take 1 tablet by mouth every 4 (four) hours as needed.       Marland Kitchen levocetirizine (XYZAL) 5 MG tablet Take 5 mg by mouth every evening.      . topiramate (TOPAMAX) 100 MG tablet Take 100 mg by mouth daily.       . naproxen (NAPROSYN) 500 MG tablet Take 1 tablet (500 mg total) by mouth 2 (two) times daily.  30 tablet  0   No current facility-administered medications for this visit.    Review of Systems Review of Systems  Constitutional: Negative.    Respiratory: Negative.   Cardiovascular: Negative.   Gastrointestinal: Negative.   Genitourinary: Negative.   Musculoskeletal: Negative.   Neurological: Positive for headaches.  Hematological: Negative.     Blood pressure 120/66, pulse 70, temperature 98.5 F (36.9 C), temperature source Oral, height 5\' 4"  (1.626 m), weight 186 lb (84.369 kg).  Physical Exam Physical Exam  Constitutional:  Overweight female  HENT:  Head: Normocephalic and atraumatic.  Neck: Neck supple.  Cardiovascular: Normal rate and regular rhythm.   Pulmonary/Chest: Effort normal and breath sounds normal.  Bilateral inverted T. Breast scars. No palpable breast masses or nipple discharge.  Lymphadenopathy:    She has no cervical adenopathy.  Neurological: She is alert.  Skin: Skin is warm and dry.  Psychiatric: She has a normal mood and affect. Her behavior is normal.    Data Reviewed Mammogram. Ultrasound. Pathology report.  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     Plan    Radioactive seed localized left breast lumpectomy. I have explained the procedure, risks, and aftercare to her.  Risks include but are not limited to bleeding, infection, wound problems, seroma formation, deformity, anesthesia.  She  seems to understand and agrees with the plan.       Jeremie Abdelaziz J 09/16/2013, 1:09 PM

## 2013-10-06 NOTE — Transfer of Care (Signed)
Immediate Anesthesia Transfer of Care Note  Patient: Kelly Alvarado  Procedure(s) Performed: Procedure(s):  RADIOACTIVE SEED LOCALIZATION LEFT BREAST LUMPECTOMY (Left)  Patient Location: PACU  Anesthesia Type:General  Level of Consciousness: awake and alert   Airway & Oxygen Therapy: Patient Spontanous Breathing and Patient connected to face mask oxygen  Post-op Assessment: Report given to PACU RN and Post -op Vital signs reviewed and stable  Post vital signs: Reviewed and stable  Complications: No apparent anesthesia complications

## 2013-10-06 NOTE — Op Note (Signed)
Operative Note  Kelly Alvarado female 38 y.o. 10/06/2013  PREOPERATIVE DX:  Left breast sclerosing papilloma  POSTOPERATIVE DX:  Same  PROCEDURE:  Left breast lumpectomy after radioactive seed localization         Surgeon: Odis Hollingshead   Assistants: Alphonsa Overall M.D.  Anesthesia: General LMA anesthesia  Indications: this is a 38 year old female who is noted to have a suspicious lesion on screening mammogram in the left infra-areolar area. Image guided biopsy demonstrated a sclerosing papilloma. Because of the  increased risk for premalignant/malignant lesion,  she now presents for wider excision. Yesterday, she had successful placement of radioactive seed for localization at [facility].    Procedure Detail:  She was seen in the holding area in the left breast marked my initials purges brought to the operative placed upon the operating table and general anesthetic was given. Mapping was performed an area of increased counts was noted just inferior to the nipple. The left breast a sterile prep and drape.  A circumareolar incision was made from the 4:00 to 9:00 position. The skin dermis and subcutaneous tissues divided. Flaps were raised in all directions. Using the neoprobe the area of increased counts was identified.  A 3-0 silk marking stitch was placed anteriorly. The lumpectomy was performed using electrocautery.  Margins were where the radioactivity disappeared in all directions and deep. The medial margin appeared to be slightly close. Once the lumpectomy was complete, I did a shaved margin of the medial aspect and oriented this is with sutures as well. Specimen mammogram demonstrated both the clip and radioactive seed to be contained in the specimen. The specimen was then sent over to pathology and the radioactive seed was retrieved.  The wound was inspected and bleeding was controlled with cautery. 0.5% plain Marcaine was infiltrated into the wound for local anesthetic effect. The  subcutaneous tissues and approximated with interrupted 3-0 Vicryl sutures. The skin was closed with a running 4-0 Monocryl subcuticular stitch. Steri-Strips and a sterile dressing were applied followed by a breast binder.  She tolerated the procedure without any apparent complications and was taken to the recovery room in satisfactory condition.  Estimated Blood Loss:  less than 100 mL         Drains: none  Blood Given: none          Specimens: Left breast lumpectomy. Left breast medial shave margin.        Complications:  * No complications entered in OR log *         Disposition: PACU - hemodynamically stable.         Condition: stable

## 2013-10-06 NOTE — Anesthesia Preprocedure Evaluation (Addendum)
Anesthesia Evaluation  Patient identified by MRN, date of birth, ID band Patient awake    Reviewed: Allergy & Precautions, H&P , NPO status , Patient's Chart, lab work & pertinent test results  Airway Mallampati: I TM Distance: >3 FB Neck ROM: Full    Dental  (+) Teeth Intact, Dental Advisory Given   Pulmonary neg pulmonary ROS,  breath sounds clear to auscultation        Cardiovascular negative cardio ROS  Rhythm:Regular Rate:Normal     Neuro/Psych  Headaches, +Multiple sclerosis  Neuromuscular disease negative psych ROS   GI/Hepatic negative GI ROS, Neg liver ROS,   Endo/Other  negative endocrine ROS  Renal/GU negative Renal ROS     Musculoskeletal negative musculoskeletal ROS (+)   Abdominal   Peds  Hematology  (+) anemia ,   Anesthesia Other Findings   Reproductive/Obstetrics negative OB ROS                          Anesthesia Physical Anesthesia Plan  ASA: II  Anesthesia Plan: General   Post-op Pain Management:    Induction: Intravenous  Airway Management Planned: LMA  Additional Equipment:   Intra-op Plan:   Post-operative Plan:   Informed Consent: I have reviewed the patients History and Physical, chart, labs and discussed the procedure including the risks, benefits and alternatives for the proposed anesthesia with the patient or authorized representative who has indicated his/her understanding and acceptance.   Dental advisory given  Plan Discussed with: CRNA and Anesthesiologist  Anesthesia Plan Comments:        Anesthesia Quick Evaluation

## 2013-10-06 NOTE — Anesthesia Procedure Notes (Signed)
Procedure Name: LMA Insertion Date/Time: 10/06/2013 9:09 AM Performed by: Melynda Ripple D Pre-anesthesia Checklist: Patient identified, Emergency Drugs available, Suction available and Patient being monitored Patient Re-evaluated:Patient Re-evaluated prior to inductionOxygen Delivery Method: Circle System Utilized Preoxygenation: Pre-oxygenation with 100% oxygen Intubation Type: IV induction Ventilation: Mask ventilation without difficulty LMA: LMA inserted LMA Size: 4.0 Number of attempts: 1 Airway Equipment and Method: bite block Placement Confirmation: positive ETCO2 Tube secured with: Tape Dental Injury: Teeth and Oropharynx as per pre-operative assessment

## 2013-10-06 NOTE — Discharge Instructions (Addendum)
[place] Office Phone Number [phone]  BREAST BIOPSY/ PARTIAL MASTECTOMY: POST OP INSTRUCTIONS  Always review your discharge instruction sheet given to you by the facility where your surgery was performed.  IF YOU HAVE DISABILITY OR FAMILY LEAVE FORMS, YOU MUST BRING THEM TO THE OFFICE FOR PROCESSING.  DO NOT GIVE THEM TO YOUR DOCTOR.  1. A prescription for pain medication may be given to you upon discharge.  Take your pain medication as prescribed, if needed.  If narcotic pain medicine is not needed, then you may take acetaminophen (Tylenol) or ibuprofen (Advil) as needed. 2. Take your usually prescribed medications unless otherwise directed 3. If you need a refill on your pain medication, please contact your pharmacy.  They will contact our office to request authorization.  Prescriptions will not be filled after 5pm or on week-ends. 4. You should eat very light the first 24 hours after surgery, such as soup, crackers, pudding, etc.  Resume your normal diet the day after surgery. 5. Most patients will experience some swelling and bruising in the breast.  Ice packs and a good support bra will help.  Swelling and bruising can take several days to resolve.  6. It is common to experience some constipation if taking pain medication after surgery.  Increasing fluid intake and taking a stool softener will usually help or prevent this problem from occurring.  A mild laxative (Milk of Magnesia or Miralax) should be taken according to package directions if there are no bowel movements after 48 hours. 7. Unless discharge instructions indicate otherwise, you may remove your bandages 48 hours after surgery, and you may shower at that time.  You may have steri-strips (small skin tapes) in place directly over the incision.  These strips should be left on the skin.  If your surgeon used skin glue on the incision, you may shower in 24 hours.  The glue will flake off over the next 2-3 weeks.   Any sutures or staples will be removed at the office during your follow-up visit. 8. ACTIVITIES:  You may resume regular daily activities (gradually increasing) beginning the next day.  Wearing a good support bra or sports bra minimizes pain and swelling.  You may have sexual intercourse when it is comfortable. a. You may drive when you no longer are taking prescription pain medication, you can comfortably wear a seatbelt, and you can safely maneuver your car and apply brakes. b. RETURN TO WORK:  2-3 days when comfortable._____________________________________________________________________________________ 9. You should see your doctor in the office for a follow-up appointment approximately two weeks after your surgery.  Please call to make this appt.  Expect your pathology report 3 business days after your surgery.  You may call to check if you do not hear from [place] after three days. 10. OTHER INSTRUCTIONS: _______________________________________________________________________________________________ _____________________________________________________________________________________________________________________________________ _____________________________________________________________________________________________________________________________________ _____________________________________________________________________________________________________________________________________  WHEN TO CALL YOUR DOCTOR: 1. Fever over 101.0 2. Nausea and/or vomiting. 3. Extreme swelling or bruising. 4. Continued bleeding from incision. 5. Increased pain, redness, or drainage from the incision.  The clinic staff is available to answer your questions during regular business hours.  Please dont hesitate to call and ask to speak to one of the nurses for clinical concerns.  If you have a medical emergency, go to the nearest emergency room or call 911.  A surgeon from [facility] is always on  call at the hospital.  For further questions, please visit [email or web address]    Post Anesthesia Home Care Instructions  Activity: Get plenty  of rest for the remainder of the day. A responsible adult should stay with you for 24 hours following the procedure.  For the next 24 hours, DO NOT: -Drive a car -Paediatric nurse -Drink alcoholic beverages -Take any medication unless instructed by your physician -Make any legal decisions or sign important papers.  Meals: Start with liquid foods such as gelatin or soup. Progress to regular foods as tolerated. Avoid greasy, spicy, heavy foods. If nausea and/or vomiting occur, drink only clear liquids until the nausea and/or vomiting subsides. Call your physician if vomiting continues.  Special Instructions/Symptoms: Your throat may feel dry or sore from the anesthesia or the breathing tube placed in your throat during surgery. If this causes discomfort, gargle with warm salt water. The discomfort should disappear within 24 hours.

## 2013-10-07 ENCOUNTER — Encounter (HOSPITAL_BASED_OUTPATIENT_CLINIC_OR_DEPARTMENT_OTHER): Payer: Self-pay | Admitting: General Surgery

## 2013-12-07 ENCOUNTER — Encounter (HOSPITAL_BASED_OUTPATIENT_CLINIC_OR_DEPARTMENT_OTHER): Payer: Self-pay | Admitting: General Surgery

## 2014-05-06 ENCOUNTER — Encounter: Payer: Self-pay | Admitting: Neurology

## 2014-05-06 ENCOUNTER — Ambulatory Visit (INDEPENDENT_AMBULATORY_CARE_PROVIDER_SITE_OTHER): Payer: BC Managed Care – PPO | Admitting: Neurology

## 2014-05-06 DIAGNOSIS — G35 Multiple sclerosis: Secondary | ICD-10-CM

## 2014-05-06 DIAGNOSIS — H539 Unspecified visual disturbance: Secondary | ICD-10-CM | POA: Diagnosis not present

## 2014-05-06 DIAGNOSIS — M791 Myalgia: Secondary | ICD-10-CM

## 2014-05-06 DIAGNOSIS — G43009 Migraine without aura, not intractable, without status migrainosus: Secondary | ICD-10-CM | POA: Diagnosis not present

## 2014-05-06 DIAGNOSIS — M609 Myositis, unspecified: Secondary | ICD-10-CM | POA: Diagnosis not present

## 2014-05-06 DIAGNOSIS — R5383 Other fatigue: Secondary | ICD-10-CM

## 2014-05-06 DIAGNOSIS — Z79899 Other long term (current) drug therapy: Secondary | ICD-10-CM | POA: Insufficient documentation

## 2014-05-06 DIAGNOSIS — G4700 Insomnia, unspecified: Secondary | ICD-10-CM

## 2014-05-06 DIAGNOSIS — IMO0001 Reserved for inherently not codable concepts without codable children: Secondary | ICD-10-CM | POA: Insufficient documentation

## 2014-05-06 MED ORDER — PHENTERMINE HCL 37.5 MG PO CAPS: 37.5000 mg | ORAL_CAPSULE | ORAL | Status: DC

## 2014-05-06 MED ORDER — ISOMETHEPTENE-APAP-DICHLORAL 65-325-100 MG PO CAPS: 1.0000 | ORAL_CAPSULE | Freq: Four times a day (QID) | ORAL | Status: DC | PRN

## 2014-05-06 MED ORDER — CYCLOBENZAPRINE HCL 5 MG PO TABS: 5.0000 mg | ORAL_TABLET | Freq: Every day | ORAL | Status: DC

## 2014-05-06 NOTE — Progress Notes (Signed)
[facility]  PATIENT: Kelly Alvarado DOB: Sep 25, 1975  REFERRING DOCTOR OR PCP:  [name]  SOURCE: patient and records  _________________________________   HISTORICAL  CHIEF COMPLAINT:  Chief Complaint  Patient presents with  . Multiple Sclerosis    Sts. she tolerates Gilenya well.  Sts. she is still having frequent h/a's--1-2 per week.  She stopped Topamax because she felt bad on it.  Sts Imitrex, Maxalt have not helped in the past.  She would like to discuss other rescue meds.  Sts. vision is some worse.  Sts. generalized aching is some worse./fim    HISTORY OF PRESENT ILLNESS:  Kelly Alvarado is a 39 year old woman who was diagnosed with multiple sclerosis in 2010 after presenting with a left foot drop. MRI of the brain was consistent with multiple sclerosis. She did not need to have a lumbar puncture performed. She was initially started on Rebif. The MS was well controlled but she felt poorly on that medication. [facility]. She was placed on Tysabri for about a year and felt much better while on it. Her MS did well on Tysabri as well.  Unfortunately, she was JCV positive and after about a year she stopped Tysabri and started Gilenya. She has done fairly well on Gilenya with no definite exacerbations. However, she does not feel as well as she did when she was on Tysabri.  Gait/strength/sensation: She feels her gait return back to normal and she no longer has a foot drop. Strength and sensation is normal in the limbs.  Vision: She has some decreased color saturation out of the right eye and notes mild blurriness at times. She has noticed some visual disturbance but I exam did not show any significant change in visual acuity.  Bladder:   She denies any urinary frequency or hesitancy. However, she sometimes will have some urgency.  Fatigue/sleep: She notes that she has fatigue that often is mild in the  morning gets worse as the day goes on. This is both physical and cognitive. She has never been on any medication for fatigue in the past. Has noted that she has gained some weight even though she is trying to stay active. She has difficulty with sleep onset and sleep maintenance insomnia. This occurs most nights. Trazodone had not helped in the past.  Mood/cognition:   She denies any depression or anxiety. No major cognitive dysfunction but does have a little bit of issues at times. Mostly she finds that she has decreased focus and attention. In the past, she was diagnosed with ADD. She has never been on a stimulant.  Migraines: She is having migraines about 18 days a month. Typically migraine would last 2 days and she will have about 9 / month. There is a mild relationship to her period, but they can occur the rest of the month also. When present, the pain is usually over the left eye and is constant, more than throbbing. She will sometimes have a mild visual aura before the migraine. When present, she will have photophobia and phonophobia. She will get nausea and occasionally will get vomiting. Moving will make the headaches worse.  Sometimes being touch in the head will trigger a migraine. She was on Topamax in the past.   She had troubles tolerating Topamax and recently quit. It did not help her that much, anyhow. Triptan such as Imitrex and Maxalt have not really helped reduce the headache pain. Fioricet had  not helped in the past. Hydrocodone took the edge off but did not stop the pain.  Myalgias: She has noted that she is more achy in the large muscles of the limbs and the upper chest and back. This seems to have been getting worse over the last year.   REVIEW OF SYSTEMS: Constitutional: No fevers, chills, sweats, or change in appetite.  She notes fatigue and insomnia. Eyes: No visual changes, double vision, eye pain Ear, nose and throat: No hearing loss, ear pain, nasal congestion, sore  throat Cardiovascular: No chest pain, palpitations Respiratory: No shortness of breath at rest or with exertion.   No wheezes GastrointestinaI: No nausea, vomiting, diarrhea, abdominal pain, fecal incontinence Genitourinary: No dysuria, urinary retention or frequency.  No nocturia. Musculoskeletal: No neck pain, back .  Notes myalgias. Integumentary: No rash, pruritus, skin lesions Neurological: as above Psychiatric: No depression at this time.  No anxiety Endocrine: No palpitations, diaphoresis, change in appetite, change in weigh or increased thirst Hematologic/Lymphatic: No anemia, purpura, petechiae. Allergic/Immunologic: No itchy/runny eyes, nasal congestion, recent allergic reactions, rashes.  She has some food allergies.  ALLERGIES: Allergies  Allergen Reactions  . Amoxicillin Hives    HOME MEDICATIONS:  Current outpatient prescriptions:  .  Clobetasol Propionate 0.05 % shampoo, , Disp: , Rfl: 2 .  Fingolimod HCl (GILENYA) 0.5 MG CAPS, Take 1 capsule by mouth every evening. , Disp: , Rfl:  .  levocetirizine (XYZAL) 5 MG tablet, Take 5 mg by mouth every evening., Disp: , Rfl:  .  oxyCODONE (OXY IR/ROXICODONE) 5 MG immediate release tablet, Take 1-2 tablets (5-10 mg total) by mouth every 4 (four) hours as needed for moderate pain, severe pain or breakthrough pain. (Patient not taking: Reported on 05/06/2014), Disp: 30 tablet, Rfl: 0 .  topiramate (TOPAMAX) 100 MG tablet, Take 100 mg by mouth every evening. , Disp: , Rfl:   PAST MEDICAL HISTORY: Past Medical History  Diagnosis Date  . Allergy   . Anemia     with pregnancy  . Pulmonary embolism 2013    lt-?bcp  . Multiple sclerosis   . Wears glasses   . Headache(784.0)   . Neuromuscular disorder     MS    PAST SURGICAL HISTORY: Past Surgical History  Procedure Laterality Date  . Breast reduction surgery    . Wisdom tooth extraction    . Breast lumpectomy with radioactive seed localization Left 10/06/2013     Procedure:  RADIOACTIVE SEED LOCALIZATION LEFT BREAST LUMPECTOMY;  Surgeon: Odis Hollingshead, MD;  Location: [facility];  Service: General;  Laterality: Left;    FAMILY HISTORY: Family History  Problem Relation Age of Onset  . Cancer Paternal Uncle   . Cancer Paternal Grandfather   . Asthma Mother   . Hypertension Father     SOCIAL HISTORY:  History   Social History  . Marital Status: Married    Spouse Name: N/A  . Number of Children: N/A  . Years of Education: N/A   Occupational History  . Not on file.   Social History Main Topics  . Smoking status: Never Smoker   . Smokeless tobacco: Never Used  . Alcohol Use: 0.0 oz/week    0 Standard drinks or equivalent per week     Comment: occ  . Drug Use: No  . Sexual Activity: Yes    Birth Control/ Protection: None     Comment: VASECTOMY   Other Topics Concern  . Not on file   Social  History Narrative     PHYSICAL EXAM  There were no vitals filed for this visit.  There is no weight on file to calculate BMI.   General: The patient is well-developed and well-nourished and in no acute distress  Eyes:  Funduscopic exam shows normal optic discs and retinal vessels.  Neck: The neck is supple, no carotid bruits are noted.  The neck is mildly tender.  Cardiovascular: The heart has a regular rate and rhythm with a normal S1 and S2. There were no murmurs, gallops or rubs. Lungs are clear to auscultation.  Skin: Extremities are without significant edema.  Musculoskeletal:  Back is non-tender.  Some tenderness in muscles of the upper chest and back.  Neurologic Exam  Mental status: The patient is alert and oriented x 3 at the time of the examination. The patient has apparent normal recent and remote memory, with an apparently normal attention span and concentration ability.   Speech is normal.  Cranial nerves: Extraocular movements are full. Pupils are equal, round, and reactive to light and accomodation.   Visual fields are full.  Facial symmetry is present. There is good facial sensation to soft touch bilaterally.Facial strength is normal.  Trapezius and sternocleidomastoid strength is normal. No dysarthria is noted.  The tongue is midline, and the patient has symmetric elevation of the soft palate. No obvious hearing deficits are noted.  Motor:  Muscle bulk is normal.   Tone is normal. Strength is  5 / 5 in all 4 extremities.   Sensory: Sensory testing is intact to pinprick, soft touch and vibration sensation in all 4 extremities.  Coordination: Cerebellar testing reveals good finger-nose-finger and heel-to-shin bilaterally.  Gait and station: Station is normal.   Gait is normal. Tandem gait is normal. Romberg is negative.   Reflexes: Deep tendon reflexes are symmetric and normal bilaterally.   Plantar responses are flexor.    DIAGNOSTIC DATA (LABS, IMAGING, TESTING) - I reviewed patient records, labs, notes, testing and imaging myself where available.  Lab Results  Component Value Date   WBC 2.8* 03/16/2013   HGB 13.6 10/06/2013   HCT 29.9* 03/16/2013   MCV 92.3 03/16/2013   PLT 235 03/16/2013        ASSESSMENT AND PLAN  Multiple sclerosis - Plan: CBC with Differential  Common migraine without intractability  High risk medication use - Plan: CBC with Differential  Visual disturbance  Other fatigue  Insomnia  Myalgia and myositis    In summary, Jiali Linney is a 39 year old woman with multiple sclerosis who is clinically stable on Gilenya therapy. She felt better while on Tysabri and we discussed the possibility of rechecking her JCV antibody to see what her titer was however, after giving this more thought she would prefer to stay on Gilenya at this time. I did discuss that there have been about 5 cases of PML on Gilenya.    We will check a CBC to make sure she does not have severe lymphopenia.  She is noting more difficulty with fatigue and focus. Additionally she  has gained some weight. On the Atrovent phentermine to see if this will help these issues we could also consider Adderall or Ritalin based on her response. She continues to have quite a few migraines. I'll have her try Midrin to break the headaches. She would prefer not to go on a daily medicine to prevent the headaches as these have not helped in the past. They continue to occur 18 days or so per month,  we could also consider Botox in the future. She is reporting some aches and pains that might be due to her poor sleep. I will add cyclobenzaprine at bedtime to see if this will help.  She will return in 5 months for regularly scheduled visit or sooner if she has new or worsening neurologic symptoms.  45 minute face-to-face evaluation with greater than 50% of the time counseling and coordinating care about MS, treatment options, and her other symptoms.    Richard A. Felecia Shelling, MD, PhD 05/21/6061, 0:16 AM Certified in Neurology, Clinical Neurophysiology, Sleep Medicine, Pain Medicine and Neuroimaging  [facility] [address] [place], [place] [address] [phone]

## 2014-05-07 LAB — CBC WITH DIFFERENTIAL/PLATELET
BASOS ABS: 0 10*3/uL (ref 0.0–0.2)
BASOS: 0 %
Eos: 0 %
Eosinophils Absolute: 0 10*3/uL (ref 0.0–0.4)
HEMATOCRIT: 35 % (ref 34.0–46.6)
HEMOGLOBIN: 11.6 g/dL (ref 11.1–15.9)
Immature Grans (Abs): 0 10*3/uL (ref 0.0–0.1)
Immature Granulocytes: 0 %
LYMPHS: 7 %
Lymphocytes Absolute: 0.2 10*3/uL — ABNORMAL LOW (ref 0.7–3.1)
MCH: 30.8 pg (ref 26.6–33.0)
MCHC: 33.1 g/dL (ref 31.5–35.7)
MCV: 93 fL (ref 79–97)
MONOCYTES: 14 %
Monocytes Absolute: 0.4 10*3/uL (ref 0.1–0.9)
Neutrophils Absolute: 2.2 10*3/uL (ref 1.4–7.0)
Neutrophils Relative %: 79 %
Platelets: 286 10*3/uL (ref 150–379)
RBC: 3.77 x10E6/uL (ref 3.77–5.28)
RDW: 14.6 % (ref 12.3–15.4)
WBC: 2.8 10*3/uL — AB (ref 3.4–10.8)

## 2014-05-10 ENCOUNTER — Telehealth: Payer: Self-pay | Admitting: *Deleted

## 2014-05-10 NOTE — Telephone Encounter (Signed)
Spoke with Kelly Alvarado and per [facility], advised labwork was ok--lymphocytes some low as expected with Gilenya, but not low enough to cause concern.  Fayetta verbalized understanding of same/fim

## 2014-05-10 NOTE — Telephone Encounter (Signed)
-----   Message from Britt Bottom, MD sent at 05/07/2014  4:00 PM EDT ----- Labs are ok...   Low lymphocytes are expected with Gilenya

## 2014-05-27 ENCOUNTER — Telehealth: Payer: Self-pay | Admitting: Neurology

## 2014-05-27 DIAGNOSIS — I2699 Other pulmonary embolism without acute cor pulmonale: Secondary | ICD-10-CM

## 2014-05-27 MED ORDER — FINGOLIMOD HCL 0.5 MG PO CAPS: 1.0000 | ORAL_CAPSULE | Freq: Every evening | ORAL | Status: DC

## 2014-05-27 NOTE — Telephone Encounter (Signed)
Patient called requesting a refill for Fingolimod HCl (GILENYA) 0.5 MG CAPS. C/b [phone]

## 2014-05-27 NOTE — Telephone Encounter (Signed)
Spoke with Takelia and advised rx. has been sent to [facility]./fim

## 2014-07-20 ENCOUNTER — Telehealth: Payer: Self-pay | Admitting: Neurology

## 2014-07-20 MED ORDER — HYDROCODONE-ACETAMINOPHEN 7.5-325 MG PO TABS: 1.0000 | ORAL_TABLET | Freq: Three times a day (TID) | ORAL | Status: DC | PRN

## 2014-07-20 NOTE — Telephone Encounter (Signed)
Per [facility], ok for Hydrocodone 7.5/325mg  #90, one up to tid prn pain.  Rx. printed, and is awaiting signature.  I have spoken with Kelly Alvarado and advised rx. will be available to be picked up tomorrow./fim

## 2014-07-20 NOTE — Telephone Encounter (Signed)
I have spoken with Kelly Alvarado this afternoon to clarify what rx. she is requesing--she sts. [facility] rx's Hydrocodone 7.5mg /325, one up to tid, #90.  She is adamant that she is not requesting Oxycodone.  As her med list does not reflect [facility] has rx'd this, and her pharmacist sts. she has not had Hydrocodone filled in the last yr, I will ask RAS to clarify and then call her back.  Kelly Alvarado is agreeable, and is aware [facility] is still seeing pt's this afternoon and it may be tomorrow when she hears back from me./fim

## 2014-07-20 NOTE — Telephone Encounter (Signed)
Patient called requesting refill for hydrocodone. Patient advised Rx will be ready within 24hrs unless otherwise informed by RN. Patient can be reached at [phone].

## 2014-07-20 NOTE — Telephone Encounter (Signed)
Operator indicates patient is requesting a Rx for Hydrocodone.  This is not currently on med list.  I called the pharmacy and spoke with Barnabas Lister who looked at patient file for the past year and said they do not have record of filling Hydrocodone during this time.  I called the patient back x 3, but was not able to reach her.  Would you like to prescribe?  Please advise.  Thank you.

## 2014-07-21 NOTE — Telephone Encounter (Signed)
Rx. up front [facility]/fim 

## 2014-07-21 NOTE — Telephone Encounter (Signed)
[place] for hydrocodone 7.5 instead of oxycodone

## 2014-09-14 ENCOUNTER — Encounter: Payer: Self-pay | Admitting: *Deleted

## 2014-09-24 ENCOUNTER — Ambulatory Visit: Payer: BC Managed Care – PPO | Admitting: Neurology

## 2015-01-03 ENCOUNTER — Encounter: Payer: Self-pay | Admitting: *Deleted

## 2015-01-03 ENCOUNTER — Other Ambulatory Visit: Payer: Self-pay | Admitting: Neurology

## 2015-01-03 MED ORDER — PHENTERMINE HCL 37.5 MG PO CAPS: 37.5000 mg | ORAL_CAPSULE | ORAL | Status: DC

## 2015-01-03 MED ORDER — HYDROCODONE-ACETAMINOPHEN 7.5-325 MG PO TABS: 1.0000 | ORAL_TABLET | Freq: Three times a day (TID) | ORAL | Status: DC | PRN

## 2015-01-03 NOTE — Telephone Encounter (Signed)
Patient called to request refill of HYDROcodone-acetaminophen (NORCO) 7.5-325 MG per tablet and phentermine 37.5 MG capsule

## 2015-01-03 NOTE — Progress Notes (Signed)
Hydrocodone and Phentermine rx's up front [facility]/fim

## 2015-01-04 ENCOUNTER — Telehealth: Payer: Self-pay | Admitting: Neurology

## 2015-01-04 NOTE — Telephone Encounter (Signed)
I called back.  Spoke with Kelly Alvarado.  She was not able to assist and transferred me to Kelly Alvarado.  Due to cost/ins restrictions they would like to dispense tablets instead of capsules.  The total dose remains the same.

## 2015-01-04 NOTE — Telephone Encounter (Signed)
Sunday Spillers with [facility] called sts phentermine 37.5 MG capsule can be changed to tablets as it is $20 cheaper and capsule if not covered under ins. She can be reached at [phone]

## 2015-05-02 ENCOUNTER — Ambulatory Visit (INDEPENDENT_AMBULATORY_CARE_PROVIDER_SITE_OTHER): Payer: BC Managed Care – PPO | Admitting: Internal Medicine

## 2015-05-02 ENCOUNTER — Other Ambulatory Visit: Payer: Self-pay | Admitting: *Deleted

## 2015-05-02 VITALS — BP 130/80 | HR 82 | Temp 98.4°F | Resp 18 | Ht 65.0 in | Wt 216.4 lb

## 2015-05-02 DIAGNOSIS — R05 Cough: Secondary | ICD-10-CM | POA: Diagnosis not present

## 2015-05-02 DIAGNOSIS — R059 Cough, unspecified: Secondary | ICD-10-CM

## 2015-05-02 DIAGNOSIS — J22 Unspecified acute lower respiratory infection: Secondary | ICD-10-CM

## 2015-05-02 DIAGNOSIS — L605 Yellow nail syndrome: Secondary | ICD-10-CM

## 2015-05-02 DIAGNOSIS — R509 Fever, unspecified: Secondary | ICD-10-CM

## 2015-05-02 DIAGNOSIS — J988 Other specified respiratory disorders: Secondary | ICD-10-CM | POA: Diagnosis not present

## 2015-05-02 LAB — POCT CBC
GRANULOCYTE PERCENT: 75.6 % (ref 37–80)
HEMATOCRIT: 34.5 % — AB (ref 37.7–47.9)
Hemoglobin: 12.3 g/dL (ref 12.2–16.2)
Lymph, poc: 0.3 — AB (ref 0.6–3.4)
MCH, POC: 31.9 pg — AB (ref 27–31.2)
MCHC: 35.8 g/dL — AB (ref 31.8–35.4)
MCV: 89.2 fL (ref 80–97)
MID (cbc): 0.4 (ref 0–0.9)
MPV: 6.8 fL (ref 0–99.8)
POC GRANULOCYTE: 2 (ref 2–6.9)
POC LYMPH %: 9.5 % — AB (ref 10–50)
POC MID %: 14.9 % — AB (ref 0–12)
Platelet Count, POC: 253 10*3/uL (ref 142–424)
RBC: 3.87 M/uL — AB (ref 4.04–5.48)
RDW, POC: 13.9 %
WBC: 2.7 10*3/uL — AB (ref 4.6–10.2)

## 2015-05-02 LAB — COMPREHENSIVE METABOLIC PANEL
ALBUMIN: 4.2 g/dL (ref 3.6–5.1)
ALT: 20 U/L (ref 6–29)
AST: 21 U/L (ref 10–30)
Alkaline Phosphatase: 73 U/L (ref 33–115)
BUN: 8 mg/dL (ref 7–25)
CHLORIDE: 104 mmol/L (ref 98–110)
CO2: 24 mmol/L (ref 20–31)
CREATININE: 0.79 mg/dL (ref 0.50–1.10)
Calcium: 8.4 mg/dL — ABNORMAL LOW (ref 8.6–10.2)
Glucose, Bld: 66 mg/dL (ref 65–99)
POTASSIUM: 3.9 mmol/L (ref 3.5–5.3)
SODIUM: 138 mmol/L (ref 135–146)
Total Bilirubin: 0.4 mg/dL (ref 0.2–1.2)
Total Protein: 6.7 g/dL (ref 6.1–8.1)

## 2015-05-02 MED ORDER — HYDROCODONE-HOMATROPINE 5-1.5 MG/5ML PO SYRP: 5.0000 mL | ORAL_SOLUTION | Freq: Four times a day (QID) | ORAL | Status: DC | PRN

## 2015-05-02 MED ORDER — DOXYCYCLINE HYCLATE 100 MG PO TABS: 100.0000 mg | ORAL_TABLET | Freq: Two times a day (BID) | ORAL | Status: DC

## 2015-05-02 MED ORDER — FLUCONAZOLE 150 MG PO TABS: 150.0000 mg | ORAL_TABLET | Freq: Once | ORAL | Status: DC

## 2015-05-02 NOTE — Progress Notes (Signed)
Subjective:  By signing my name below, I, [name], attest that this documentation has been prepared under the direction and in the presence of Tami Lin, MD. Electronically Signed: [name], [place]. 05/02/2015 , 3:12 PM .  Patient was seen in Room 10 .   Patient ID: Kelly Alvarado, female    DOB: July 27, 1975, 40 y.o.   MRN: [id number] Chief Complaint  Patient presents with  . Cough    x 3 days  . Sore Throat  . Nail Problem    yellow   HPI Kelly Alvarado is a 40 y.o. female who presents to [facility] complaining of cough with chest congestion, and shortness of breath that started 3 days ago. She's been coughing up sputum. She also notes having a fever initially and intermittent fever during the nights. When she lays down at night, she has trouble breathing through her nose. She's also coughing at night which keeps her up at night. She mentions that she was in [place], [place] last week, and traveled via airplane.   She also notes that her nails are yellowing, noticed 3 days ago and she's concerned. She denies wearing any nail polish.   Patient has multiple sclerosis, and is followed by Dr. Arlice Colt. She's taking Gilenya and is tolerating it well.   Patient Active Problem List   Diagnosis Date Noted  . Common migraine without intractability 05/06/2014  . High risk medication use 05/06/2014  . Visual disturbance 05/06/2014  . Other fatigue 05/06/2014  . Insomnia 05/06/2014  . Myalgia and myositis 05/06/2014  . 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 Pulmonary embolism ([place]) 07/17/2011  . Multiple sclerosis ([place]) 07/17/2011    Current outpatient prescriptions:  .  cyclobenzaprine (FLEXERIL) 5 MG tablet, Take 1 tablet (5 mg total) by mouth at bedtime., Disp: 30 tablet, Rfl: 11 .  Fingolimod HCl (GILENYA) 0.5 MG CAPS, Take 1 capsule (0.5 mg total) by mouth every evening., Disp: 90 capsule, Rfl: 3 .  levocetirizine (XYZAL) 5 MG tablet, Take 5 mg by mouth  every evening., Disp: , Rfl:  .  Clobetasol Propionate 0.05 % shampoo, Reported on 05/02/2015, Disp: , Rfl: 2 .  HYDROcodone-acetaminophen (NORCO) 7.5-325 MG tablet, Take 1 tablet by mouth 3 (three) times daily as needed for moderate pain. (Patient not taking: Reported on 05/02/2015), Disp: 90 tablet, Rfl: 0 .  isometheptene-acetaminophen-dichloralphenazone (MIDRIN) 65-325-100 MG capsule, Take 1 capsule by mouth 4 (four) times daily as needed for migraine. Maximum 5 capsules in 12 hours for migraine headaches, 8 capsules in 24 hours for tension headaches. (Patient not taking: Reported on 05/02/2015), Disp: 60 capsule, Rfl: 4 .  oxyCODONE (OXY IR/ROXICODONE) 5 MG immediate release tablet, Take 1-2 tablets (5-10 mg total) by mouth every 4 (four) hours as needed for moderate pain, severe pain or breakthrough pain. (Patient not taking: Reported on 05/06/2014), Disp: 30 tablet, Rfl: 0 .  phentermine 37.5 MG capsule, Take 1 capsule (37.5 mg total) by mouth every morning. (Patient not taking: Reported on 05/02/2015), Disp: 30 capsule, Rfl: 5 .  topiramate (TOPAMAX) 100 MG tablet, Take 100 mg by mouth every evening. Reported on 05/02/2015, Disp: , Rfl:   Review of Systems  Constitutional: Positive for fatigue. Negative for fever and chills.  HENT: Positive for congestion. Negative for rhinorrhea and sore throat.   Respiratory: Positive for cough and shortness of breath. Negative for wheezing.   Gastrointestinal: Negative for nausea, vomiting and diarrhea.  Musculoskeletal: Negative for myalgias.  Skin:  Nails yellowing      Objective:   Physical Exam  Constitutional: She is oriented to person, place, and time. She appears well-developed and well-nourished. No distress.  HENT:  Head: Normocephalic and atraumatic.  Eyes: EOM are normal. Pupils are equal, round, and reactive to light.  Neck: Neck supple.  Cardiovascular: Normal rate.   Pulmonary/Chest: Effort normal. No respiratory distress.    Musculoskeletal: Normal range of motion.  Neurological: She is alert and oriented to person, place, and time.  Skin: Skin is warm and dry.  Psychiatric: She has a normal mood and affect. Her behavior is normal.  Nursing note and vitals reviewed.  BP 130/80 mmHg  Pulse 82  Temp(Src) 98.4 F (36.9 C) (Oral)  Resp 18  Ht 5\' 5"  (1.651 m)  Wt 216 lb 6.4 oz (98.158 kg)  BMI 36.01 kg/m2  SpO2 96%  LMP 04/11/2015 (Approximate)   Results for orders placed or performed in visit on 05/02/15  POCT CBC  Result Value Ref Range   WBC 2.7 (A) 4.6 - 10.2 K/uL   Lymph, poc 0.3 (A) 0.6 - 3.4   POC LYMPH PERCENT 9.5 (A) 10 - 50 %L   MID (cbc) 0.4 0 - 0.9   POC MID % 14.9 (A) 0 - 12 %M   POC Granulocyte 2.0 2 - 6.9   Granulocyte percent 75.6 37 - 80 %G   RBC 3.87 (A) 4.04 - 5.48 M/uL   Hemoglobin 12.3 12.2 - 16.2 g/dL   HCT, POC 34.5 (A) 37.7 - 47.9 %   MCV 89.2 80 - 97 fL   MCH, POC 31.9 (A) 27 - 31.2 pg   MCHC 35.8 (A) 31.8 - 35.4 g/dL   RDW, POC 13.9 %   Platelet Count, POC 253 142 - 424 K/uL   MPV 6.8 0 - 99.8 fL  temp repeat 99-while clammy    Assessment & Plan:  I have completed the patient encounter in its entirety as documented by the scribe, with editing by me where necessary. Kelly Alvarado P. Laney Pastor, M.D.  Lower respiratory infection (e.g., bronchitis, pneumonia, pneumonitis, pulmonitis)  Cough - Plan: POCT CBC  Other specified fever  Yellow nails - Plan: POCT CBC, Comprehensive metabolic panel  Is immunocompr so will be more aggressive Meds ordered this encounter  Medications  . HYDROcodone-homatropine (HYCODAN) 5-1.5 MG/5ML syrup    Sig: Take 5 mLs by mouth every 6 (six) hours as needed.    Dispense:  120 mL    Refill:  0  . doxycycline (VIBRA-TABS) 100 MG tablet    Sig: Take 1 tablet (100 mg total) by mouth 2 (two) times daily.    Dispense:  20 tablet    Refill:  0   QT prol if add zith to [employer]

## 2015-05-02 NOTE — Patient Instructions (Signed)
     IF you received an x-ray today, you will receive an invoice from [facility]. Please contact [facility] at [phone] with questions or concerns regarding your invoice.   IF you received labwork today, you will receive an invoice from [facility]. Please contact [facility] at [phone] with questions or concerns regarding your invoice.   Our billing staff will not be able to assist you with questions regarding bills from these companies.  You will be contacted with the lab results as soon as they are available. The fastest way to get your results is to activate your My Chart account. Instructions are located on the last page of this paperwork. If you have not heard from us regarding the results in 2 weeks, please contact this office.      

## 2015-05-12 ENCOUNTER — Emergency Department (HOSPITAL_COMMUNITY)
Admission: EM | Admit: 2015-05-12 | Discharge: 2015-05-12 | Disposition: A | Discharge status: Home / Self Care | Payer: BC Managed Care – PPO | Attending: Emergency Medicine | Admitting: Emergency Medicine

## 2015-05-12 DIAGNOSIS — R0602 Shortness of breath: Secondary | ICD-10-CM | POA: Insufficient documentation

## 2015-05-12 NOTE — ED Notes (Signed)
Bed: RESB Expected date:  Expected time:  Means of arrival:  Comments: EMS/wheezing

## 2015-05-12 NOTE — ED Notes (Signed)
Per pt report: pt reports increasing difficulty in breathing.  Pt was dx with the flu 05/02/15.  Pt reports having finished the course of doxycyline.  Pt reports she has been feeling like she "can't catch her breath"  Pt panting in triage but on 100% on RA. Hx MS.

## 2015-06-05 ENCOUNTER — Other Ambulatory Visit: Payer: Self-pay | Admitting: Neurology

## 2015-06-07 ENCOUNTER — Other Ambulatory Visit: Payer: Self-pay | Admitting: Neurology

## 2015-06-17 ENCOUNTER — Encounter: Payer: Self-pay | Admitting: Neurology

## 2015-06-17 ENCOUNTER — Ambulatory Visit (INDEPENDENT_AMBULATORY_CARE_PROVIDER_SITE_OTHER): Payer: BC Managed Care – PPO | Admitting: Neurology

## 2015-06-17 VITALS — BP 156/90 | HR 70 | Resp 16 | Ht 65.0 in | Wt 219.0 lb

## 2015-06-17 DIAGNOSIS — H539 Unspecified visual disturbance: Secondary | ICD-10-CM | POA: Diagnosis not present

## 2015-06-17 DIAGNOSIS — G35 Multiple sclerosis: Secondary | ICD-10-CM

## 2015-06-17 DIAGNOSIS — G35D Multiple sclerosis, unspecified: Secondary | ICD-10-CM

## 2015-06-17 DIAGNOSIS — R5383 Other fatigue: Secondary | ICD-10-CM

## 2015-06-17 DIAGNOSIS — G43009 Migraine without aura, not intractable, without status migrainosus: Secondary | ICD-10-CM

## 2015-06-17 DIAGNOSIS — Z79899 Other long term (current) drug therapy: Secondary | ICD-10-CM

## 2015-06-17 DIAGNOSIS — G4700 Insomnia, unspecified: Secondary | ICD-10-CM | POA: Diagnosis not present

## 2015-06-17 MED ORDER — PHENTERMINE HCL 37.5 MG PO CAPS: 37.5000 mg | ORAL_CAPSULE | ORAL | Status: DC

## 2015-06-17 MED ORDER — ISOMETHEPTENE-DICHLORAL-APAP 65-100-325 MG PO CAPS: 1.0000 | ORAL_CAPSULE | Freq: Four times a day (QID) | ORAL | Status: DC | PRN

## 2015-06-17 MED ORDER — CYCLOBENZAPRINE HCL 5 MG PO TABS: 5.0000 mg | ORAL_TABLET | Freq: Every day | ORAL | Status: DC

## 2015-06-17 MED ORDER — HYDROCODONE-ACETAMINOPHEN 7.5-325 MG PO TABS: 1.0000 | ORAL_TABLET | Freq: Three times a day (TID) | ORAL | Status: DC | PRN

## 2015-06-17 NOTE — Progress Notes (Addendum)
[facility]  PATIENT: Kelly Alvarado DOB: 08/02/1975  REFERRING DOCTOR OR PCP:  [name]  SOURCE: patient and records  _________________________________   HISTORICAL  CHIEF COMPLAINT:  No chief complaint on file.   HISTORY OF PRESENT ILLNESS:  Kelly Alvarado is a 40 year old woman with MS.   she is taking Gilenya daily and tolerates it well. She has not had any definite exacerbations while she has taken the medication.  Gait/strength/sensation: She feels her gait return back to normal and she no longer has a foot drop. Strength and sensation is normal in the limbs.  Vision: She has mild reduced color saturation out of the right eye and notes mild blurriness at times. No diplopia.  Bladder:   She has occasional urinary frequency or hesitancy. No incontinence.   No hesitancy  Fatigue/sleep: She notes some fatigue that often is mild in the morning gets worse as the day goes on. This is both physical and cognitive. She has gained some weight even though she is trying to stay active. She has less insomnia than she had in the past.    There is difficulty with sleep onset more than sleep maintenance insomnia. This occurs some nights. Trazodone had not helped in the past.Flexeril at night had helped.     Mood/cognition:   She denies any depression or anxiety. No major cognitive dysfunction but does have a little bit of issues at times. Mostly she finds that she has decreased focus and attention. In the past, she was diagnosed with ADD. Vyvanse was not well tolerated.  She never took Phentermine.  Migraines: She is having migraines about 15-18 days a month. Typically migraine lasts 2 days. There is a mild relationship to her period, but they can any time of the month. When present, the pain is usually over the left eye and is constant, more than throbbing. She will sometimes have a mild visual aura before the migraine. When present, she will have photophobia and  phonophobia. She will get nausea and occasionally will get vomiting. Moving will make the headaches worse.  Sometimes being touch in the head will trigger a migraine. Topamax had not helped much.    Triptan such as Imitrex and Maxalt have not really helped reduce the headache pain. Fioricet had not helped in the past. Midrin and Hydrocodone often help some.  MS History:   She was diagnosed with multiple sclerosis in 2010 after presenting with a left foot drop. MRI of the brain was consistent with multiple sclerosis.   She was  started on Rebif. The MS was well controlled but she felt poorly on that medication. She transferred to [facility] and began to see me in 2012. She was placed on Tysabri for about a year and felt much better while on it. Her MS did well on Tysabri as well.  Unfortunately, she was JCV positive and after about a year she stopped Tysabri and started Gilenya. She has done fairly well on Gilenya with no definite exacerbations.      REVIEW OF SYSTEMS: Constitutional: No fevers, chills, sweats, or change in appetite.  She notes fatigue and insomnia. Eyes: No visual changes, double vision, eye pain Ear, nose and throat: No hearing loss, ear pain, nasal congestion, sore throat Cardiovascular: No chest pain, palpitations Respiratory: No shortness of breath at rest or with exertion.   No wheezes GastrointestinaI: No nausea, vomiting, diarrhea, abdominal pain, fecal incontinence Genitourinary: No dysuria, urinary retention or frequency.  No nocturia. Musculoskeletal: No  neck pain, back .  Notes myalgias. Integumentary: No rash, pruritus, skin lesions Neurological: as above Psychiatric: No depression at this time.  No anxiety Endocrine: No palpitations, diaphoresis, change in appetite, change in weigh or increased thirst Hematologic/Lymphatic: No anemia, purpura, petechiae. Allergic/Immunologic: No itchy/runny eyes, nasal congestion, recent allergic reactions, rashes.   She has some food allergies.  ALLERGIES: Allergies  Allergen Reactions  . Amoxicillin Hives    HOME MEDICATIONS:  Current outpatient prescriptions:  .  Clobetasol Propionate 0.05 % shampoo, Reported on 05/02/2015, Disp: , Rfl: 2 .  cyclobenzaprine (FLEXERIL) 5 MG tablet, Take 1 tablet (5 mg total) by mouth at bedtime., Disp: 30 tablet, Rfl: 11 .  doxycycline (VIBRA-TABS) 100 MG tablet, Take 1 tablet (100 mg total) by mouth 2 (two) times daily., Disp: 20 tablet, Rfl: 0 .  Fingolimod HCl (GILENYA) 0.5 MG CAPS, Take 1 capsule (0.5 mg total) by mouth every evening., Disp: 30 capsule, Rfl: 0 .  fluconazole (DIFLUCAN) 150 MG tablet, Take 1 tablet (150 mg total) by mouth once. Repeat if needed, Disp: 2 tablet, Rfl: 0 .  HYDROcodone-acetaminophen (NORCO) 7.5-325 MG tablet, Take 1 tablet by mouth 3 (three) times daily as needed for moderate pain., Disp: 90 tablet, Rfl: 0 .  HYDROcodone-homatropine (HYCODAN) 5-1.5 MG/5ML syrup, Take 5 mLs by mouth every 6 (six) hours as needed., Disp: 120 mL, Rfl: 0 .  isometheptene-acetaminophen-dichloralphenazone (MIDRIN) 65-100-325 MG capsule, Take 1 capsule by mouth 4 (four) times daily as needed for migraine. Maximum 5 capsules in 12 hours for migraine headaches, 8 capsules in 24 hours for tension headaches., Disp: 30 capsule, Rfl: 3 .  levocetirizine (XYZAL) 5 MG tablet, Take 5 mg by mouth every evening., Disp: , Rfl:  .  oxyCODONE (OXY IR/ROXICODONE) 5 MG immediate release tablet, Take 1-2 tablets (5-10 mg total) by mouth every 4 (four) hours as needed for moderate pain, severe pain or breakthrough pain. (Patient not taking: Reported on 05/06/2014), Disp: 30 tablet, Rfl: 0 .  phentermine 37.5 MG capsule, Take 1 capsule (37.5 mg total) by mouth every morning., Disp: 30 capsule, Rfl: 5 .  topiramate (TOPAMAX) 100 MG tablet, Take 100 mg by mouth every evening. Reported on 05/02/2015, Disp: , Rfl:   PAST MEDICAL HISTORY: Past Medical History  Diagnosis Date  .  Allergy   . Anemia     with pregnancy  . Pulmonary embolism ([place]) 2013    lt-?bcp  . Multiple sclerosis ([place])   . Wears glasses   . Headache(784.0)   . Neuromuscular disorder ([place])     MS    PAST SURGICAL HISTORY: Past Surgical History  Procedure Laterality Date  . Breast reduction surgery    . Wisdom tooth extraction    . Breast lumpectomy with radioactive seed localization Left 10/06/2013    Procedure:  RADIOACTIVE SEED LOCALIZATION LEFT BREAST LUMPECTOMY;  Surgeon: Odis Hollingshead, MD;  Location: [place];  Service: General;  Laterality: Left;    FAMILY HISTORY: Family History  Problem Relation Age of Onset  . Cancer Paternal Uncle   . Cancer Paternal Grandfather   . Asthma Mother   . Hypertension Father     SOCIAL HISTORY:  Social History   Social History  . Marital Status: Married    Spouse Name: N/A  . Number of Children: N/A  . Years of Education: N/A   Occupational History  . Not on file.   Social History Main Topics  . Smoking status: Never Smoker   .  Smokeless tobacco: Never Used  . Alcohol Use: 0.0 oz/week    0 Standard drinks or equivalent per week     Comment: occ  . Drug Use: No  . Sexual Activity: Yes    Birth Control/ Protection: None     Comment: VASECTOMY   Other Topics Concern  . Not on file   Social History Narrative     PHYSICAL EXAM  There were no vitals filed for this visit.  There is no weight on file to calculate BMI.   General: The patient is well-developed and well-nourished and in no acute distress   Musculoskeletal:  Back is non-tender.  Some tenderness in muscles of the upper chest and back.  Neurologic Exam  Mental status: The patient is alert and oriented x 3 at the time of the examination. The patient has apparent normal recent and remote memory, with an apparently normal attention span and concentration ability.   Speech is normal.  Cranial nerves: Extraocular movements are full.   Mild  reduced color vision on the right There is good facial sensation to soft touch bilaterally.Facial strength is normal.  Trapezius and sternocleidomastoid strength is normal. No dysarthria is noted.  The tongue is midline, and the patient has symmetric elevation of the soft palate. No obvious hearing deficits are noted.  Motor:  Muscle bulk is normal.   Tone is normal. Strength is  5 / 5 in all 4 extremities.   Sensory: Sensory testing is intact to pinprick, soft touch and vibration sensation in all 4 extremities.  Coordination: Cerebellar testing reveals good finger-nose-finger and heel-to-shin bilaterally.  Gait and station: Station is normal.   Gait is normal. Tandem gait is normal. Romberg is negative.   Reflexes: Deep tendon reflexes are symmetric and normal bilaterally.        DIAGNOSTIC DATA (LABS, IMAGING, TESTING) - I reviewed patient records, labs, notes, testing and imaging myself where available.  Lab Results  Component Value Date   WBC 2.7* 05/02/2015   HGB 12.3 05/02/2015   HCT 34.5* 05/02/2015   MCV 89.2 05/02/2015   PLT 286 05/06/2014        ASSESSMENT AND PLAN  Multiple sclerosis ([place]) - Plan: CBC with Differential/Platelet  Other fatigue  Visual disturbance  Insomnia  High risk medication use - Plan: CBC with Differential/Platelet  Common migraine without intractability    1.   Continue Gilenya. I will check a CBC with differential today. 2.   Phentermine 37.5 daily to help with weight and fatigue and attention. 3.   Renew Flexeril daily at bedtime for insomnia.   Renew hydrocodone and Midrin 4.   We discussed taking riboflavin and magnesium as a natural supplement may help prevent headache. 5.   Stay active and exercises. 45 minute face-to-face evaluation with greater than 50% of the time counseling and coordinating care about MS, treatment options, and her other symptoms.    Richard A. Felecia Shelling, MD, PhD [id number], [id number] AM Certified in Neurology,  Clinical Neurophysiology, Sleep Medicine, Pain Medicine and Neuroimaging  [facility] [address], [place], [place] [address] [phone]   ADDENDUM: She has chronic daily migraine with 15-18 days a month of headache. Has failed multiple medications, both prophylactically and abortive. Specifically, she has failed tricyclics, anti-inflammatories, muscle relaxants, Topamax. Triptan's have not helped. Fioricet does not help.  Midrin helped but is no longer available.  We will go ahead and start Botox for her chronic migraine  The second issue is her  MS-related fatigue. Phentermine was very expensive. However, we could try a [place] coupon as it seems to be much more affordable    we will mail another prescription to her.  Richard A. Felecia Shelling, MD, PhD

## 2015-06-18 LAB — CBC WITH DIFFERENTIAL/PLATELET
BASOS ABS: 0 10*3/uL (ref 0.0–0.2)
Basos: 0 %
EOS (ABSOLUTE): 0 10*3/uL (ref 0.0–0.4)
EOS: 1 %
HEMATOCRIT: 32.2 % — AB (ref 34.0–46.6)
HEMOGLOBIN: 10.9 g/dL — AB (ref 11.1–15.9)
IMMATURE GRANS (ABS): 0 10*3/uL (ref 0.0–0.1)
IMMATURE GRANULOCYTES: 0 %
LYMPHS: 8 %
Lymphocytes Absolute: 0.4 10*3/uL — ABNORMAL LOW (ref 0.7–3.1)
MCH: 29.9 pg (ref 26.6–33.0)
MCHC: 33.9 g/dL (ref 31.5–35.7)
MCV: 88 fL (ref 79–97)
MONOCYTES: 10 %
Monocytes Absolute: 0.5 10*3/uL (ref 0.1–0.9)
NEUTROS PCT: 81 %
Neutrophils Absolute: 4 10*3/uL (ref 1.4–7.0)
Platelets: 327 10*3/uL (ref 150–379)
RBC: 3.65 x10E6/uL — AB (ref 3.77–5.28)
RDW: 15.1 % (ref 12.3–15.4)
WBC: 4.8 10*3/uL (ref 3.4–10.8)

## 2015-06-20 ENCOUNTER — Telehealth: Payer: Self-pay | Admitting: Neurology

## 2015-06-20 NOTE — Telephone Encounter (Signed)
LMTC./fim 

## 2015-06-20 NOTE — Telephone Encounter (Signed)
Pt returned call. Please call back.

## 2015-06-20 NOTE — Telephone Encounter (Signed)
Answering Service : pt called in because insurance will not cover rx written on 5/12. Pt needs something else. May call [phone]

## 2015-06-20 NOTE — Telephone Encounter (Signed)
LMOM cell #.  Home # "memory is full"/fim

## 2015-06-22 ENCOUNTER — Telehealth: Payer: Self-pay | Admitting: *Deleted

## 2015-06-22 MED ORDER — PHENTERMINE HCL 37.5 MG PO CAPS: 37.5000 mg | ORAL_CAPSULE | ORAL | Status: DC

## 2015-06-22 MED ORDER — INDOMETHACIN 25 MG PO CAPS: ORAL_CAPSULE | ORAL | Status: DC

## 2015-06-22 NOTE — Telephone Encounter (Signed)
[employer] RX coupon and rx. mailed to pt's home address today.  I lmom to let her know this has been done.  Botox order given to Danielle/fim

## 2015-06-22 NOTE — Addendum Note (Signed)
Addended by: Arlice Colt A on: 06/22/2015 01:04 PM   Modules accepted: Orders

## 2015-06-22 NOTE — Telephone Encounter (Signed)
I spoke to Kelly Alvarado about her 2 concerns.  She has on a daily migraine with 15-18 days a month of headache. Has failed multiple medications, both prophylactically and abortive. Specifically, she has failed tricyclics, anti-inflammatories, muscle relaxants, Topamax. Triptan's have not helped. Fioricet does not help.  Midrin helped but is no longer available.  We will go ahead and start Botox for her chronic migraine  The second issue is her MS-related fatigue. Phentermine was very expensive. However, we could try a [place] coupon as it seems to be much more affordable    we will mail another prescription to her.

## 2015-06-22 NOTE — Telephone Encounter (Signed)
-----   Message from Britt Bottom, MD sent at 06/20/2015 12:25 PM EDT ----- Please let her know that the lymphocyte count is okay.     (on Gilenya low is expected)

## 2015-06-22 NOTE — Telephone Encounter (Signed)
Dr. Felecia Shelling spoken with pt. today and gave results/fim

## 2015-07-01 ENCOUNTER — Other Ambulatory Visit: Payer: Self-pay | Admitting: Neurology

## 2015-07-07 ENCOUNTER — Telehealth: Payer: Self-pay | Admitting: Neurology

## 2015-07-07 MED ORDER — FINGOLIMOD HCL 0.5 MG PO CAPS: 0.5000 mg | ORAL_CAPSULE | Freq: Every day | ORAL | Status: DC

## 2015-07-07 NOTE — Telephone Encounter (Signed)
[facility]/fim

## 2015-07-07 NOTE — Telephone Encounter (Signed)
[facility] [phone] called said RX was rec'd for gilenya but pt is requesting 90 day supply with 3 refills. Please send new RX. Thank you

## 2015-08-22 ENCOUNTER — Telehealth: Payer: Self-pay | Admitting: Neurology

## 2015-08-22 DIAGNOSIS — G35 Multiple sclerosis: Secondary | ICD-10-CM

## 2015-08-22 NOTE — Telephone Encounter (Signed)
I have spoken with Kelly Alvarado and per RAS, advised it would be unlikely to have a clot in there right arm Creasy has a hx. of dvt/pe and was concerned). Per RAS, I have also advised she may restart Topamax at 50mg  qhs for one week, then increase to 100mg  qhs.  MRI brain with and without contrast ordered, so that she can do this prior  her appt. with RAS on 7-28.  She is agreeable with this tx. plan./fim

## 2015-08-22 NOTE — Telephone Encounter (Signed)
Patient reports new all over pain but a constant pain in the rt arm that started over a mth ago.. She is wanting to go back on topamax..  An appointment was made for 09/02/15. Advised that the nurse would call

## 2015-08-22 NOTE — Telephone Encounter (Signed)
I have spoken with Kelly Alvarado this morning.  She c/o increased generalized pain, pain right elbow with movement, increase in h/a's. No gait disturbance. Last mri 2 yrs. ago. Would like to restart Topamax for ha's, and will check with RAS about repeating mri/fim

## 2015-08-25 MED ORDER — TOPIRAMATE 100 MG PO TABS: 100.0000 mg | ORAL_TABLET | Freq: Every day | ORAL | Status: DC

## 2015-08-25 NOTE — Telephone Encounter (Signed)
Pt called in requesting a refill on Topamax. Pt thought she had some but she does not.

## 2015-08-25 NOTE — Telephone Encounter (Signed)
[facility]/fim

## 2015-08-25 NOTE — Addendum Note (Signed)
Addended by: France Ravens I on: 08/25/2015 10:35 AM   Modules accepted: Orders

## 2015-09-02 ENCOUNTER — Ambulatory Visit (INDEPENDENT_AMBULATORY_CARE_PROVIDER_SITE_OTHER): Payer: BC Managed Care – PPO | Admitting: Neurology

## 2015-09-02 ENCOUNTER — Encounter: Payer: Self-pay | Admitting: Neurology

## 2015-09-02 VITALS — BP 132/86 | Resp 16 | Ht 65.0 in | Wt 217.5 lb

## 2015-09-02 DIAGNOSIS — G43709 Chronic migraine without aura, not intractable, without status migrainosus: Secondary | ICD-10-CM

## 2015-09-02 DIAGNOSIS — E669 Obesity, unspecified: Secondary | ICD-10-CM

## 2015-09-02 DIAGNOSIS — G35 Multiple sclerosis: Secondary | ICD-10-CM | POA: Diagnosis not present

## 2015-09-02 DIAGNOSIS — H539 Unspecified visual disturbance: Secondary | ICD-10-CM

## 2015-09-02 DIAGNOSIS — R208 Other disturbances of skin sensation: Secondary | ICD-10-CM | POA: Diagnosis not present

## 2015-09-02 DIAGNOSIS — G4700 Insomnia, unspecified: Secondary | ICD-10-CM

## 2015-09-02 DIAGNOSIS — G43009 Migraine without aura, not intractable, without status migrainosus: Secondary | ICD-10-CM | POA: Diagnosis not present

## 2015-09-02 DIAGNOSIS — Z79899 Other long term (current) drug therapy: Secondary | ICD-10-CM

## 2015-09-02 DIAGNOSIS — IMO0002 Reserved for concepts with insufficient information to code with codable children: Secondary | ICD-10-CM | POA: Insufficient documentation

## 2015-09-02 DIAGNOSIS — R5383 Other fatigue: Secondary | ICD-10-CM | POA: Diagnosis not present

## 2015-09-02 MED ORDER — TOPIRAMATE 100 MG PO TABS: ORAL_TABLET | ORAL | 11 refills | Status: DC

## 2015-09-02 MED ORDER — HYDROCODONE-ACETAMINOPHEN 7.5-325 MG PO TABS: 1.0000 | ORAL_TABLET | Freq: Three times a day (TID) | ORAL | 0 refills | Status: DC | PRN

## 2015-09-02 MED ORDER — PHENTERMINE HCL 37.5 MG PO CAPS: 37.5000 mg | ORAL_CAPSULE | ORAL | 5 refills | Status: DC

## 2015-09-02 NOTE — Progress Notes (Signed)
[facility]  PATIENT: Kelly Alvarado DOB: January 10, 1976  REFERRING DOCTOR OR PCP:  [name]  SOURCE: patient and records  _________________________________   HISTORICAL  CHIEF COMPLAINT:  Chief Complaint  Patient presents with  . Multiple Sclerosis    Sts. she continues to tolerate Gilenya well.  Today she would like to discuss more frequent h/a's--almost daily.  She restarted Topamax this week and is currently taking 50mg  bid.  She also c/o increased in "sparks" of generalized pain, esp. right arm/fim  . Headaches  . Decreased Visual Acuity    Right eye 20/100, Left eye 20/20, without corrective lenses/fim    HISTORY OF PRESENT ILLNESS:  Kelly Alvarado is a 40 year old woman with MS.   She is taking Gilenya daily and tolerates it well. She has not had any definite exacerbations while she has taken the medication.  However, she is having some electric shock like sensations allover but worse in right arm and legs.    She feels these dysesthesias are slightly better since starting Topamax and she tolerates it well.    She denies Lhermite's signs --- shocks are random and frequent.   She canceled the MRI we scheduled at her last visit.  Gait/strength: She feels her gait returned to normal after initial exacerbation. Strength  is normal in the limbs.  Vision: She has mild reduced color saturation out of the right eye and notes mild blurriness at times. No diplopia.   She is 20/30 OD and 20/20 OS  Bladder:   She has occasional urinary frequency or hesitancy. No incontinence.   No hesitancy  Fatigue/sleep: She notes fatigue that gets worse as the day goes on. This is both physical and cognitive. She has gained some weight even though she is trying to stay active. Insomnia is doing better with night time exercise.        Mood/cognition:   She denies any depression or anxiety. No major cognitive dysfunction but does have a little bit of issues at times. Mostly she  finds that she has decreased focus and attention. In the past, she was diagnosed with ADD. Vyvanse was not well tolerated.    Migraines: She is having migraines about 25 days a month. Typically migraine lasts 2 days, some last 5. There is a mild relationship to her period, but they can any time of the month. When present, the pain is usually over the left eye and is constant, more than throbbing. She will sometimes have a mild visual aura before the migraine. When present, she will have photophobia and phonophobia. She will get nausea and occasionally will get vomiting. Moving will make the headaches worse.  Sometimes being touch in the head will trigger a migraine. Topamax at low dose did not help much.   A higher dose is being tried d much.    Triptan such as Imitrex and Maxalt have not really helped reduce the headache pain. Fioricet had not helped in the past. Midrin and Hydrocodone or indomethacin often help some.       MS History:   She was diagnosed with multiple sclerosis in 2010 after presenting with a left foot drop. MRI of the brain was consistent with multiple sclerosis.   She was  started on Rebif. The MS was well controlled but she felt poorly on that medication. She transferred to [facility] and began to see me in 2012. She was placed on Tysabri for about a year and felt much better while on it.  Her MS did well on Tysabri as well.  Unfortunately, she was JCV positive and after about a year she stopped Tysabri and started Gilenya. She has done fairly well on Gilenya with no definite exacerbations.      REVIEW OF SYSTEMS: Constitutional: No fevers, chills, sweats, or change in appetite.  She notes fatigue and insomnia. Eyes: No visual changes, double vision, eye pain Ear, nose and throat: No hearing loss, ear pain, nasal congestion, sore throat Cardiovascular: No chest pain, palpitations Respiratory: No shortness of breath at rest or with exertion.   No  wheezes GastrointestinaI: No nausea, vomiting, diarrhea, abdominal pain, fecal incontinence Genitourinary: No dysuria, urinary retention or frequency.  No nocturia. Musculoskeletal: No neck pain, back .  Notes myalgias. Integumentary: No rash, pruritus, skin lesions Neurological: as above Psychiatric: No depression at this time.  No anxiety Endocrine: No palpitations, diaphoresis, change in appetite, change in weigh or increased thirst Hematologic/Lymphatic: No anemia, purpura, petechiae. Allergic/Immunologic: No itchy/runny eyes, nasal congestion, recent allergic reactions, rashes.  She has some food allergies.  ALLERGIES: Allergies  Allergen Reactions  . Amoxicillin Hives    HOME MEDICATIONS:  Current Outpatient Prescriptions:  .  Clobetasol Propionate 0.05 % shampoo, Reported on 05/02/2015, Disp: , Rfl: 2 .  cyclobenzaprine (FLEXERIL) 5 MG tablet, Take 1 tablet (5 mg total) by mouth at bedtime., Disp: 30 tablet, Rfl: 11 .  Fingolimod HCl (GILENYA) 0.5 MG CAPS, Take 1 capsule (0.5 mg total) by mouth daily., Disp: 90 capsule, Rfl: 1 .  HYDROcodone-acetaminophen (NORCO) 7.5-325 MG tablet, Take 1 tablet by mouth 3 (three) times daily as needed for moderate pain., Disp: 90 tablet, Rfl: 0 .  indomethacin (INDOCIN) 25 MG capsule, Take one prn headache, up to tid., Disp: 60 capsule, Rfl: 5 .  isometheptene-acetaminophen-dichloralphenazone (MIDRIN) 65-100-325 MG capsule, Take 1 capsule by mouth 4 (four) times daily as needed for migraine. Maximum 5 capsules in 12 hours for migraine headaches, 8 capsules in 24 hours for tension headaches., Disp: 30 capsule, Rfl: 3 .  levocetirizine (XYZAL) 5 MG tablet, Take 5 mg by mouth every evening., Disp: , Rfl:  .  phentermine 37.5 MG capsule, Take 1 capsule (37.5 mg total) by mouth every morning., Disp: 30 capsule, Rfl: 5 .  topiramate (TOPAMAX) 100 MG tablet, Take one or two at bedtime, Disp: 60 tablet, Rfl: 11  PAST MEDICAL HISTORY: Past Medical  History:  Diagnosis Date  . Allergy   . Anemia    with pregnancy  . Headache(784.0)   . Multiple sclerosis ([place])   . Neuromuscular disorder ([place])    MS  . Pulmonary embolism ([place]) 2013   lt-?bcp  . Wears glasses     PAST SURGICAL HISTORY: Past Surgical History:  Procedure Laterality Date  . BREAST LUMPECTOMY WITH RADIOACTIVE SEED LOCALIZATION Left 10/06/2013   Procedure:  RADIOACTIVE SEED LOCALIZATION LEFT BREAST LUMPECTOMY;  Surgeon: Odis Hollingshead, MD;  Location: [facility];  Service: General;  Laterality: Left;  . BREAST REDUCTION SURGERY    . WISDOM TOOTH EXTRACTION      FAMILY HISTORY: Family History  Problem Relation Age of Onset  . Cancer Paternal Uncle   . Cancer Paternal Grandfather   . Asthma Mother   . Hypertension Father     SOCIAL HISTORY:  Social History   Social History  . Marital status: Married    Spouse name: N/A  . Number of children: N/A  . Years of education: N/A   Occupational History  . Not  on file.   Social History Main Topics  . Smoking status: Never Smoker  . Smokeless tobacco: Never Used  . Alcohol use 0.0 oz/week     Comment: occ  . Drug use: No  . Sexual activity: Yes    Birth control/ protection: None     Comment: VASECTOMY   Other Topics Concern  . Not on file   Social History Narrative  . No narrative on file     PHYSICAL EXAM  Vitals:   09/02/15 0922  BP: 132/86  Resp: 16  Weight: 217 lb 8 oz (98.7 kg)  Height: 5\' 5"  (1.651 m)    Body mass index is 36.19 kg/m.   General: The patient is well-developed and well-nourished and in no acute distress   Musculoskeletal:  Back is non-tender.  Some tenderness in muscles of the upper chest and back.  Neurologic Exam  Mental status: The patient is alert and oriented x 3 at the time of the examination. The patient has apparent normal recent and remote memory, with an apparently normal attention span and concentration ability.   Speech is  normal.  Cranial nerves: Extraocular movements are full.   Mild reduced color vision on the right There is good facial sensation to soft touch bilaterally.Facial strength is normal.  Trapezius and sternocleidomastoid strength is normal. No dysarthria is noted.  The tongue is midline, and the patient has symmetric elevation of the soft palate. No obvious hearing deficits are noted.  Motor:  Muscle bulk is normal.   Tone is normal. Strength is  5 / 5 in all 4 extremities.   Sensory: Sensory testing is intact to pinprick, soft touch and vibration sensation in all 4 extremities.  Coordination: Cerebellar testing reveals good finger-nose-finger and heel-to-shin bilaterally.  Gait and station: Station is normal.   Gait is normal. Tandem gait is normal. Romberg is negative.   Reflexes: Deep tendon reflexes are symmetric and normal bilaterally.        DIAGNOSTIC DATA (LABS, IMAGING, TESTING) - I reviewed patient records, labs, notes, testing and imaging myself where available.  Lab Results  Component Value Date   WBC 4.8 06/17/2015   HGB 12.3 05/02/2015   HCT 32.2 (L) 06/17/2015   MCV 88 06/17/2015   PLT 327 06/17/2015        ASSESSMENT AND PLAN  Multiple sclerosis ([facility])  Common migraine without intractability  Chronic migraine  High risk medication use  Visual disturbance  Other fatigue  Insomnia  Dysesthesia  Obesity   1.   Continue Gilenya. I will check a CBC with differential today. 2.   Phentermine 37.5 daily to help with weight and fatigue and attention. 3.   Renew Flexeril daily at bedtime for insomnia.   Ok for hydrocodone and Midrin 4.   Stay active and exercises.  5.   She will return in 4 months or sooner if there are new or worsening neurologic symptoms.   Richard A. Felecia Shelling, MD, PhD [id number], [id number] AM Certified in Neurology, Clinical Neurophysiology, Sleep Medicine, Pain Medicine and Neuroimaging  [facility] [address] [place], [place] [address] [phone]

## 2015-09-14 ENCOUNTER — Other Ambulatory Visit: Payer: BC Managed Care – PPO

## 2015-10-25 ENCOUNTER — Telehealth: Payer: Self-pay | Admitting: *Deleted

## 2015-10-25 NOTE — Telephone Encounter (Signed)
Gilenya PA completed and faxed to [facility] fax# [phone]

## 2015-10-27 NOTE — Telephone Encounter (Signed)
Fax received from [place].  Gilenya PA approved.  Plan Member ID: [id number].  Effective dates: 10-25-15 thru 10-24-17.  Questions may be directed to [phone]./fim

## 2015-11-10 ENCOUNTER — Other Ambulatory Visit: Payer: Self-pay | Admitting: Obstetrics and Gynecology

## 2015-11-28 ENCOUNTER — Other Ambulatory Visit: Payer: Self-pay | Admitting: Obstetrics and Gynecology

## 2015-12-12 NOTE — H&P (Signed)
Kelly Alvarado is a 40 y.o.  female P: 3-0-2-3 who presents for hysterectomy because of menorrhagia and fibroids.  Over the past 5 years the patient has had heavy menstrual periods with limited management options because she has a history of pulmonary embolism.  Her menstural flow will last from 8-10 days requiring hourly pad change.  She has clots and cramping that she rates as an 8/10 on a 10 point pain scale (decreasing only to 6/10 with Naproxen 500 mg).  She goes on to report lower back pain but no changes in her bowel or bladder function.  She has not had dyspareunia.  An endometrial biopsy done in October revealed secretory endometrium with  no hyperplasia, atypia or malignancy.  A TSH and CBC were normal.  The pelvic ultrasound done during this evaluation showed:  an anteverted uterus: 5.77 x 5.39 6.08 cm, endometrium-10.78 cm with # 5 fibroids: Subserosal anterior-1.69 & 1.84 cm, posterior-1.69 cm; Intramural- left anterior- 1.72 & 2.69 cm; left ovary-3.10 cm and right ovary-2.24 cm.  After reviewing both medical and surgical management options for her symptoms the patient has chosen definitive therapy in the form of hysterectomy.   Past Medical History  OB History: G: 5;   P: 3-0-2-3; SVB: 1998, 2000 and 2004  [largest infant, 7 lbs.]  GYN History: menarche: 56 YO    LMP: 11/20/2015    Contracepton: Vasectomy;  Has a history of HSV-2 and remote  history of abnormal PAP smear that was simply repeated and normal since.   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  Medications: Phentermine 37.5 mg  daily Vitamin D 50, 000 IU weekly Gilenya 0.5 mg daily Vicodin 7.5/325  every 6 hours prn Naproxen ER 500 mg bid pc prn Topiramate 100 mg  qhs Valacyclovir 1000 mg daily   Allergies  Allergen Reactions  . Amoxicillin Hives   Denies sensitivity to peanuts, shellfish, soy, latex or adhesives.  ROS: Admits to glasses, weekly migraines, nausea with migraines but; denies vision changes, nasal congestion, dysphagia, tinnitus, dizziness, hoarseness, cough,  chest pain, shortness of breath, vomiting, diarrhea,constipation,  urinary frequency, urgency  dysuria, hematuria, vaginitis symptoms, pelvic pain, swelling of joints,easy bruising,  myalgias, arthralgias, skin rashes, unexplained weight loss and except as is mentioned in the history of present illness, patient's review of systems is otherwise negative.   Physical Exam  124/72   P: 76 bpm   R: 16  Temperature: 98.5 degrees F orally   Weight: 210 lbs.  Height: 5' 4.5" BMI: 35.5  Neck: supple without masses or thyromegaly Lungs: clear to auscultation Heart: regular rate and rhythm Abdomen: soft, non-tender and no organomegaly Pelvic:EGBUS- wnl; vagina-normal rugae; uterus-normal size, cervix without lesions or motion tenderness; adnexae-no tenderness or masses Extremities:  no clubbing, cyanosis or edema   Assesment: Menorrhagia           Uterine Fibroids   Disposition:  A discussion was held with patient regarding the indication for her procedure(s) along with the risks, which include but are not limited to: reaction to anesthesia, damage to adjacent organs, infection and excessive bleeding. The patient verbalized understanding of these risks and has consented to  proceed with a Total Vaginal Hysterectomy, Bilateral Salpingectomy and Cystoscopy at [facility] on December 27, 2015.   CSN# [id number]   [place] J. Florene Glen, PA-C  for Dr. Harvie Bridge. Mancel Bale  H/o PE while on OCPs and after ankle sprain - plan pharmacologic  and mechanical VTE prophylaxis.

## 2015-12-16 ENCOUNTER — Encounter (HOSPITAL_COMMUNITY)
Admission: RE | Admit: 2015-12-16 | Discharge: 2015-12-16 | Disposition: A | Discharge status: Home / Self Care | Payer: BC Managed Care – PPO | Source: Ambulatory Visit | Attending: Obstetrics and Gynecology | Admitting: Obstetrics and Gynecology

## 2015-12-16 ENCOUNTER — Encounter (HOSPITAL_COMMUNITY): Payer: Self-pay

## 2015-12-16 ENCOUNTER — Other Ambulatory Visit: Payer: Self-pay

## 2015-12-16 DIAGNOSIS — Z01810 Encounter for preprocedural cardiovascular examination: Secondary | ICD-10-CM | POA: Diagnosis present

## 2015-12-16 DIAGNOSIS — Z01812 Encounter for preprocedural laboratory examination: Secondary | ICD-10-CM | POA: Diagnosis not present

## 2015-12-16 LAB — CBC
HEMATOCRIT: 31.4 % — AB (ref 36.0–46.0)
Hemoglobin: 10.9 g/dL — ABNORMAL LOW (ref 12.0–15.0)
MCH: 30.4 pg (ref 26.0–34.0)
MCHC: 34.7 g/dL (ref 30.0–36.0)
MCV: 87.5 fL (ref 78.0–100.0)
Platelets: 328 10*3/uL (ref 150–400)
RBC: 3.59 MIL/uL — ABNORMAL LOW (ref 3.87–5.11)
RDW: 13.9 % (ref 11.5–15.5)
WBC: 2.7 10*3/uL — ABNORMAL LOW (ref 4.0–10.5)

## 2015-12-16 LAB — BASIC METABOLIC PANEL
Anion gap: 5 (ref 5–15)
BUN: 12 mg/dL (ref 6–20)
CHLORIDE: 110 mmol/L (ref 101–111)
CO2: 23 mmol/L (ref 22–32)
Calcium: 9.4 mg/dL (ref 8.9–10.3)
Creatinine, Ser: 0.93 mg/dL (ref 0.44–1.00)
GFR calc Af Amer: >60 mL/min (ref >60)
GFR calc non Af Amer: >60 mL/min (ref >60)
GLUCOSE: 80 mg/dL (ref 65–99)
POTASSIUM: 3.9 mmol/L (ref 3.5–5.1)
Sodium: 138 mmol/L (ref 135–145)

## 2015-12-16 NOTE — Patient Instructions (Addendum)
Your procedure is scheduled on: Tuesday December 27, 2015  Enter through the Main Entrance of [facility] at: 7 am  Pick up the phone at the desk and dial [phone].  Call this number if you have problems the morning of surgery: [phone].  Remember: Do NOT eat food or drink liquids after Midnight on  Monday November 20 Do NOT drink clear liquids after: Take these medicines the morning of surgery with a SIP OF WATER: NONE STOP TAKING PHENTERMINE TODAY  Do NOT wear jewelry (body piercing), metal hair clips/bobby pins, make-up, or nail polish. Do NOT wear lotions, powders, or perfumes.  You may wear deoderant. Do NOT shave for 48 hours prior to surgery. Do NOT bring valuables to the hospital. Contacts, dentures, or bridgework may not be worn into surgery. Leave suitcase in car.  After surgery it may be brought to your room.  For patients admitted to the hospital, checkout time is 11:00 AM the day of discharge.

## 2015-12-24 ENCOUNTER — Other Ambulatory Visit: Payer: Self-pay | Admitting: Neurology

## 2015-12-26 MED ORDER — DEXTROSE 5 % IV SOLN
INTRAVENOUS | Status: AC
  Administered 2015-12-27: 115 mL via INTRAVENOUS
  Filled 2015-12-26: qty 9

## 2015-12-27 ENCOUNTER — Observation Stay (HOSPITAL_COMMUNITY)
Admission: RE | Admit: 2015-12-27 | Discharge: 2015-12-28 | Disposition: A | Discharge status: Home / Self Care | Payer: BC Managed Care – PPO | Source: Ambulatory Visit | Attending: Obstetrics and Gynecology | Admitting: Obstetrics and Gynecology

## 2015-12-27 ENCOUNTER — Ambulatory Visit (HOSPITAL_COMMUNITY): Payer: BC Managed Care – PPO | Admitting: Anesthesiology

## 2015-12-27 ENCOUNTER — Encounter (HOSPITAL_COMMUNITY)
Admission: RE | Disposition: A | Discharge status: Home / Self Care | Payer: Self-pay | Source: Ambulatory Visit | Attending: Obstetrics and Gynecology

## 2015-12-27 ENCOUNTER — Encounter (HOSPITAL_COMMUNITY): Payer: Self-pay | Admitting: *Deleted

## 2015-12-27 DIAGNOSIS — Z8042 Family history of malignant neoplasm of prostate: Secondary | ICD-10-CM | POA: Insufficient documentation

## 2015-12-27 DIAGNOSIS — Z86711 Personal history of pulmonary embolism: Secondary | ICD-10-CM | POA: Insufficient documentation

## 2015-12-27 DIAGNOSIS — Z8249 Family history of ischemic heart disease and other diseases of the circulatory system: Secondary | ICD-10-CM | POA: Diagnosis not present

## 2015-12-27 DIAGNOSIS — N946 Dysmenorrhea, unspecified: Secondary | ICD-10-CM | POA: Diagnosis not present

## 2015-12-27 DIAGNOSIS — Z880 Allergy status to penicillin: Secondary | ICD-10-CM | POA: Diagnosis not present

## 2015-12-27 DIAGNOSIS — N920 Excessive and frequent menstruation with regular cycle: Principal | ICD-10-CM | POA: Insufficient documentation

## 2015-12-27 DIAGNOSIS — D251 Intramural leiomyoma of uterus: Secondary | ICD-10-CM | POA: Insufficient documentation

## 2015-12-27 DIAGNOSIS — E559 Vitamin D deficiency, unspecified: Secondary | ICD-10-CM | POA: Insufficient documentation

## 2015-12-27 DIAGNOSIS — N72 Inflammatory disease of cervix uteri: Secondary | ICD-10-CM | POA: Diagnosis not present

## 2015-12-27 DIAGNOSIS — E669 Obesity, unspecified: Secondary | ICD-10-CM | POA: Diagnosis not present

## 2015-12-27 DIAGNOSIS — N838 Other noninflammatory disorders of ovary, fallopian tube and broad ligament: Secondary | ICD-10-CM | POA: Diagnosis not present

## 2015-12-27 DIAGNOSIS — Z6835 Body mass index (BMI) 35.0-35.9, adult: Secondary | ICD-10-CM | POA: Diagnosis not present

## 2015-12-27 DIAGNOSIS — G35 Multiple sclerosis: Secondary | ICD-10-CM | POA: Diagnosis not present

## 2015-12-27 HISTORY — PX: BILATERAL SALPINGECTOMY: SHX5743

## 2015-12-27 HISTORY — PX: VAGINAL HYSTERECTOMY: SHX2639

## 2015-12-27 HISTORY — PX: CYSTOSCOPY: SHX5120

## 2015-12-27 LAB — CREATININE, SERUM
CREATININE: 1.04 mg/dL — AB (ref 0.44–1.00)
GFR calc Af Amer: >60 mL/min (ref >60)

## 2015-12-27 LAB — CBC
HCT: 33.8 % — ABNORMAL LOW (ref 36.0–46.0)
Hemoglobin: 11.6 g/dL — ABNORMAL LOW (ref 12.0–15.0)
MCH: 30.4 pg (ref 26.0–34.0)
MCHC: 34.3 g/dL (ref 30.0–36.0)
MCV: 88.7 fL (ref 78.0–100.0)
PLATELETS: 193 10*3/uL (ref 150–400)
RBC: 3.81 MIL/uL — ABNORMAL LOW (ref 3.87–5.11)
RDW: 13.9 % (ref 11.5–15.5)
WBC: 10.3 10*3/uL (ref 4.0–10.5)

## 2015-12-27 LAB — PREGNANCY, URINE: Preg Test, Ur: NEGATIVE

## 2015-12-27 SURGERY — HYSTERECTOMY, VAGINAL
Anesthesia: General

## 2015-12-27 MED ORDER — MIDAZOLAM HCL 2 MG/2ML IJ SOLN
INTRAMUSCULAR | Status: AC
  Filled 2015-12-27: qty 2

## 2015-12-27 MED ORDER — PHENYLEPHRINE HCL 10 MG/ML IJ SOLN
INTRAMUSCULAR | Status: DC | PRN
  Administered 2015-12-27: 40 ug via INTRAVENOUS

## 2015-12-27 MED ORDER — PROMETHAZINE HCL 25 MG/ML IJ SOLN
6.2500 mg | INTRAMUSCULAR | Status: DC | PRN
Start: 2015-12-27 — End: 2015-12-27

## 2015-12-27 MED ORDER — EPHEDRINE 5 MG/ML INJ
INTRAVENOUS | Status: AC
  Filled 2015-12-27: qty 10

## 2015-12-27 MED ORDER — HYDROMORPHONE HCL 1 MG/ML IJ SOLN
INTRAMUSCULAR | Status: AC
  Filled 2015-12-27: qty 1

## 2015-12-27 MED ORDER — FENTANYL CITRATE (PF) 100 MCG/2ML IJ SOLN
INTRAMUSCULAR | Status: AC
  Filled 2015-12-27: qty 2

## 2015-12-27 MED ORDER — SCOPOLAMINE 1 MG/3DAYS TD PT72
MEDICATED_PATCH | TRANSDERMAL | Status: AC
  Filled 2015-12-27: qty 1

## 2015-12-27 MED ORDER — PROPOFOL 10 MG/ML IV BOLUS
INTRAVENOUS | Status: DC | PRN
  Administered 2015-12-27: 150 mg via INTRAVENOUS

## 2015-12-27 MED ORDER — VASOPRESSIN 20 UNIT/ML IJ SOLN
INTRAMUSCULAR | Status: DC | PRN
  Administered 2015-12-27: 40 [IU]

## 2015-12-27 MED ORDER — ONDANSETRON HCL 4 MG/2ML IJ SOLN
INTRAMUSCULAR | Status: AC
Start: 2015-12-27 — End: 2015-12-27
  Filled 2015-12-27: qty 2

## 2015-12-27 MED ORDER — SODIUM CHLORIDE 0.9% FLUSH: 9.0000 mL | INTRAVENOUS | Status: DC | PRN

## 2015-12-27 MED ORDER — ROCURONIUM BROMIDE 100 MG/10ML IV SOLN
INTRAVENOUS | Status: AC
  Filled 2015-12-27: qty 1

## 2015-12-27 MED ORDER — FENTANYL CITRATE (PF) 100 MCG/2ML IJ SOLN
INTRAMUSCULAR | Status: DC | PRN
  Administered 2015-12-27: 50 ug via INTRAVENOUS
  Administered 2015-12-27 (×2): 100 ug via INTRAVENOUS
  Administered 2015-12-27: 50 ug via INTRAVENOUS

## 2015-12-27 MED ORDER — FENTANYL CITRATE (PF) 250 MCG/5ML IJ SOLN
INTRAMUSCULAR | Status: AC
  Filled 2015-12-27: qty 5

## 2015-12-27 MED ORDER — LACTATED RINGERS IV SOLN
INTRAVENOUS | Status: DC
  Administered 2015-12-27 (×2): via INTRAVENOUS

## 2015-12-27 MED ORDER — SODIUM CHLORIDE 0.9 % IJ SOLN
INTRAMUSCULAR | Status: AC
  Filled 2015-12-27: qty 100

## 2015-12-27 MED ORDER — SUGAMMADEX SODIUM 200 MG/2ML IV SOLN
INTRAVENOUS | Status: DC | PRN
  Administered 2015-12-27: 200 mg via INTRAVENOUS

## 2015-12-27 MED ORDER — MIDAZOLAM HCL 2 MG/2ML IJ SOLN
INTRAMUSCULAR | Status: DC | PRN
  Administered 2015-12-27: 2 mg via INTRAVENOUS

## 2015-12-27 MED ORDER — HYDROMORPHONE HCL 1 MG/ML IJ SOLN
0.2500 mg | INTRAMUSCULAR | Status: DC | PRN
  Administered 2015-12-27 (×3): 0.5 mg via INTRAVENOUS

## 2015-12-27 MED ORDER — STERILE WATER FOR IRRIGATION IR SOLN
Status: DC | PRN
  Administered 2015-12-27: 1000 mL

## 2015-12-27 MED ORDER — DIPHENHYDRAMINE HCL 50 MG/ML IJ SOLN
12.5000 mg | Freq: Four times a day (QID) | INTRAMUSCULAR | Status: DC | PRN
  Administered 2015-12-27: 12.5 mg via INTRAVENOUS
  Filled 2015-12-27: qty 1

## 2015-12-27 MED ORDER — KETOROLAC TROMETHAMINE 30 MG/ML IJ SOLN: 30.0000 mg | Freq: Once | INTRAMUSCULAR | Status: DC

## 2015-12-27 MED ORDER — SODIUM CHLORIDE 0.9 % IJ SOLN
INTRAMUSCULAR | Status: DC | PRN
  Administered 2015-12-27: 100 mL

## 2015-12-27 MED ORDER — ONDANSETRON HCL 4 MG PO TABS: 4.0000 mg | ORAL_TABLET | Freq: Three times a day (TID) | ORAL | Status: DC | PRN

## 2015-12-27 MED ORDER — ONDANSETRON HCL 4 MG/2ML IJ SOLN: 4.0000 mg | Freq: Four times a day (QID) | INTRAMUSCULAR | Status: DC | PRN

## 2015-12-27 MED ORDER — LACTATED RINGERS IV SOLN
INTRAVENOUS | Status: DC
  Administered 2015-12-27: 16:00:00 via INTRAVENOUS
  Administered 2015-12-28: 125 mL/h via INTRAVENOUS

## 2015-12-27 MED ORDER — VASOPRESSIN 20 UNIT/ML IV SOLN
INTRAVENOUS | Status: AC
  Filled 2015-12-27: qty 2

## 2015-12-27 MED ORDER — GLYCOPYRROLATE 0.2 MG/ML IJ SOLN
INTRAMUSCULAR | Status: DC | PRN
  Administered 2015-12-27: 0.1 mg via INTRAVENOUS
  Administered 2015-12-27: 0.2 mg via INTRAVENOUS
  Administered 2015-12-27: 0.1 mg via INTRAVENOUS

## 2015-12-27 MED ORDER — ROCURONIUM BROMIDE 100 MG/10ML IV SOLN
INTRAVENOUS | Status: DC | PRN
  Administered 2015-12-27: 50 mg via INTRAVENOUS

## 2015-12-27 MED ORDER — MENTHOL 3 MG MT LOZG: 1.0000 | LOZENGE | OROMUCOSAL | Status: DC | PRN

## 2015-12-27 MED ORDER — BUPIVACAINE HCL (PF) 0.25 % IJ SOLN
INTRAMUSCULAR | Status: AC
  Filled 2015-12-27: qty 30

## 2015-12-27 MED ORDER — NALOXONE HCL 0.4 MG/ML IJ SOLN: 0.4000 mg | INTRAMUSCULAR | Status: DC | PRN

## 2015-12-27 MED ORDER — PROPOFOL 10 MG/ML IV BOLUS
INTRAVENOUS | Status: AC
  Filled 2015-12-27: qty 20

## 2015-12-27 MED ORDER — EPHEDRINE SULFATE 50 MG/ML IJ SOLN
INTRAMUSCULAR | Status: DC | PRN
  Administered 2015-12-27: 5 mg via INTRAVENOUS

## 2015-12-27 MED ORDER — SCOPOLAMINE 1 MG/3DAYS TD PT72
1.0000 | MEDICATED_PATCH | Freq: Once | TRANSDERMAL | Status: DC
  Administered 2015-12-27: 1.5 mg via TRANSDERMAL

## 2015-12-27 MED ORDER — MEPERIDINE HCL 25 MG/ML IJ SOLN: 6.2500 mg | INTRAMUSCULAR | Status: DC | PRN

## 2015-12-27 MED ORDER — DEXAMETHASONE SODIUM PHOSPHATE 10 MG/ML IJ SOLN
INTRAMUSCULAR | Status: DC | PRN
  Administered 2015-12-27: 4 mg via INTRAVENOUS

## 2015-12-27 MED ORDER — DIPHENHYDRAMINE HCL 12.5 MG/5ML PO ELIX
12.5000 mg | ORAL_SOLUTION | Freq: Four times a day (QID) | ORAL | Status: DC | PRN
  Administered 2015-12-27: 12.5 mg via ORAL
  Filled 2015-12-27: qty 5

## 2015-12-27 MED ORDER — HYDROMORPHONE 1 MG/ML IV SOLN
INTRAVENOUS | Status: DC
  Administered 2015-12-27: 0.3 mg via INTRAVENOUS
  Administered 2015-12-27: 3 mg via INTRAVENOUS
  Administered 2015-12-27: 2.2 mL via INTRAVENOUS
  Administered 2015-12-28: 0.6 mg via INTRAVENOUS
  Filled 2015-12-27: qty 25

## 2015-12-27 MED ORDER — OXYCODONE-ACETAMINOPHEN 5-325 MG PO TABS: 1.0000 | ORAL_TABLET | ORAL | Status: DC | PRN

## 2015-12-27 MED ORDER — HEPARIN SODIUM (PORCINE) 5000 UNIT/ML IJ SOLN
INTRAMUSCULAR | Status: AC
  Filled 2015-12-27: qty 1

## 2015-12-27 MED ORDER — KETOROLAC TROMETHAMINE 30 MG/ML IJ SOLN
INTRAMUSCULAR | Status: AC
  Filled 2015-12-27: qty 1

## 2015-12-27 MED ORDER — HEPARIN SODIUM (PORCINE) 5000 UNIT/ML IJ SOLN
5000.0000 [IU] | Freq: Once | INTRAMUSCULAR | Status: AC
  Administered 2015-12-27: 5000 [IU] via SUBCUTANEOUS

## 2015-12-27 MED ORDER — FINGOLIMOD HCL 0.5 MG PO CAPS: 0.5000 mg | ORAL_CAPSULE | Freq: Every day | ORAL | Status: DC

## 2015-12-27 MED ORDER — TOPIRAMATE 100 MG PO TABS
100.0000 mg | ORAL_TABLET | Freq: Every day | ORAL | Status: DC
  Administered 2015-12-27: 100 mg via ORAL
  Filled 2015-12-27 (×2): qty 1

## 2015-12-27 MED ORDER — DEXAMETHASONE SODIUM PHOSPHATE 4 MG/ML IJ SOLN
INTRAMUSCULAR | Status: AC
  Filled 2015-12-27: qty 1

## 2015-12-27 MED ORDER — HEPARIN SODIUM (PORCINE) 5000 UNIT/ML IJ SOLN
5000.0000 [IU] | Freq: Three times a day (TID) | INTRAMUSCULAR | Status: DC
  Administered 2015-12-27 – 2015-12-28 (×3): 5000 [IU] via SUBCUTANEOUS
  Filled 2015-12-27 (×6): qty 1

## 2015-12-27 MED ORDER — LIDOCAINE HCL (CARDIAC) 20 MG/ML IV SOLN
INTRAVENOUS | Status: AC
  Filled 2015-12-27: qty 5

## 2015-12-27 SURGICAL SUPPLY — 49 items
CANISTER SUCT 3000ML (MISCELLANEOUS) ×3 IMPLANT
CLOTH BEACON ORANGE TIMEOUT ST (SAFETY) ×3 IMPLANT
CONT PATH 16OZ SNAP LID 3702 (MISCELLANEOUS) ×3 IMPLANT
CONTAINER PREFILL 10% NBF 15ML (MISCELLANEOUS) IMPLANT
DECANTER SPIKE VIAL GLASS SM (MISCELLANEOUS) ×3 IMPLANT
DRAPE SHEET LG 3/4 BI-LAMINATE (DRAPES) ×6 IMPLANT
DRAPE STERI URO 9X17 APER PCH (DRAPES) ×3 IMPLANT
DRSG OPSITE POSTOP 3X4 (GAUZE/BANDAGES/DRESSINGS) IMPLANT
DURAPREP 26ML APPLICATOR (WOUND CARE) IMPLANT
ELECT REM PT RETURN 9FT ADLT (ELECTROSURGICAL) ×3
ELECTRODE REM PT RTRN 9FT ADLT (ELECTROSURGICAL) ×2 IMPLANT
GLOVE BIO SURGEON STRL SZ7.5 (GLOVE) ×3 IMPLANT
GLOVE BIOGEL PI IND STRL 6.5 (GLOVE) ×2 IMPLANT
GLOVE BIOGEL PI IND STRL 7.0 (GLOVE) ×2 IMPLANT
GLOVE BIOGEL PI IND STRL 7.5 (GLOVE) ×4 IMPLANT
GLOVE BIOGEL PI INDICATOR 6.5 (GLOVE) ×1
GLOVE BIOGEL PI INDICATOR 7.0 (GLOVE) ×1
GLOVE BIOGEL PI INDICATOR 7.5 (GLOVE) ×2
GOWN STRL REUS W/TWL LRG LVL3 (GOWN DISPOSABLE) ×12 IMPLANT
LIQUID BAND (GAUZE/BANDAGES/DRESSINGS) IMPLANT
NEEDLE HYPO 22GX1.5 SAFETY (NEEDLE) ×3 IMPLANT
NEEDLE MAYO CATGUT SZ4 (NEEDLE) IMPLANT
NS IRRIG 1000ML POUR BTL (IV SOLUTION) ×3 IMPLANT
PACK ABDOMINAL MINOR (CUSTOM PROCEDURE TRAY) IMPLANT
PACK TRENDGUARD 450 HYBRID PRO (MISCELLANEOUS) IMPLANT
PACK TRENDGUARD 600 HYBRD PROC (MISCELLANEOUS) IMPLANT
PACK VAGINAL WOMENS (CUSTOM PROCEDURE TRAY) ×3 IMPLANT
PAD OB MATERNITY 4.3X12.25 (PERSONAL CARE ITEMS) ×3 IMPLANT
PENCIL BUTTON HOLSTER BLD 10FT (ELECTRODE) ×3 IMPLANT
SET CYSTO W/LG BORE CLAMP LF (SET/KITS/TRAYS/PACK) ×3 IMPLANT
SHEET LAVH (DRAPES) ×3 IMPLANT
SPONGE LAP 4X18 X RAY DECT (DISPOSABLE) ×3 IMPLANT
SUT MNCRL AB 3-0 PS2 27 (SUTURE) IMPLANT
SUT PLAIN 0 NONE (SUTURE) IMPLANT
SUT VIC AB 0 CT1 18XCR BRD8 (SUTURE) ×6 IMPLANT
SUT VIC AB 0 CT1 27 (SUTURE) ×3
SUT VIC AB 0 CT1 27XBRD ANBCTR (SUTURE) ×6 IMPLANT
SUT VIC AB 0 CT1 36 (SUTURE) IMPLANT
SUT VIC AB 0 CT1 8-18 (SUTURE) ×3
SUT VIC AB 2-0 SH 27 (SUTURE) ×3
SUT VIC AB 2-0 SH 27XBRD (SUTURE) ×6 IMPLANT
SUT VICRYL 0 TIES 12 18 (SUTURE) ×3 IMPLANT
SYR CONTROL 10ML LL (SYRINGE) ×3 IMPLANT
SYR TB 1ML 25GX5/8 (SYRINGE) IMPLANT
TOWEL OR 17X24 6PK STRL BLUE (TOWEL DISPOSABLE) ×6 IMPLANT
TRAY FOLEY CATH SILVER 14FR (SET/KITS/TRAYS/PACK) ×3 IMPLANT
TRENDGUARD 450 HYBRID PRO PACK (MISCELLANEOUS)
TRENDGUARD 600 HYBRID PROC PK (MISCELLANEOUS)
WATER STERILE IRR 1000ML POUR (IV SOLUTION) ×3 IMPLANT

## 2015-12-27 NOTE — Transfer of Care (Signed)
Immediate Anesthesia Transfer of Care Note  Patient: Kelly Alvarado  Procedure(s) Performed: Procedure(s) with comments: HYSTERECTOMY VAGINAL (Bilateral) BILATERAL SALPINGECTOMY (Bilateral) - Excision of paratubal cyst CYSTOSCOPY (N/A)  Patient Location: PACU  Anesthesia Type:General  Level of Consciousness: awake, alert  and oriented  Airway & Oxygen Therapy: Patient Spontanous Breathing and Patient connected to nasal cannula oxygen  Post-op Assessment: Report given to RN and Post -op Vital signs reviewed and stable  Post vital signs: Reviewed and stable  Last Vitals:  Vitals:   12/27/15 0724  BP: 132/80  Pulse: 68  Resp: 18  Temp: 36.8 C    Last Pain:  Vitals:   12/27/15 0724  TempSrc: Oral      Patients Stated Pain Goal: 4 ([id number])  Complications: No apparent anesthesia complications

## 2015-12-27 NOTE — Anesthesia Procedure Notes (Signed)
Procedure Name: Intubation Date/Time: 12/27/2015 8:55 AM Performed by: Karren Newland, Sheron Nightingale Pre-anesthesia Checklist: Patient identified, Patient being monitored, Emergency Drugs available, Timeout performed and Suction available Patient Re-evaluated:Patient Re-evaluated prior to inductionOxygen Delivery Method: Circle system utilized Preoxygenation: Pre-oxygenation with 100% oxygen Intubation Type: IV induction Ventilation: Mask ventilation without difficulty Laryngoscope Size: Mac and 3 Grade View: Grade I Tube type: Oral Tube size: 7.0 mm Number of attempts: 1 Placement Confirmation: ETT inserted through vocal cords under direct vision,  positive ETCO2 and breath sounds checked- equal and bilateral Secured at: 22 cm Dental Injury: Teeth and Oropharynx as per pre-operative assessment

## 2015-12-27 NOTE — Anesthesia Procedure Notes (Signed)
Performed by: Dandrea Medders M       

## 2015-12-27 NOTE — Addendum Note (Signed)
Addendum  created 12/27/15 1952 by Flossie Dibble, CRNA   Sign clinical note

## 2015-12-27 NOTE — Anesthesia Preprocedure Evaluation (Signed)
Anesthesia Evaluation  Patient identified by MRN, date of birth, ID band Patient awake    Reviewed: Allergy & Precautions, H&P , NPO status , Patient's Chart, lab work & pertinent test results, reviewed documented beta blocker date and time   Airway Mallampati: I  TM Distance: >3 FB Neck ROM: full    Dental no notable dental hx. (+) Teeth Intact   Pulmonary neg pulmonary ROS,    Pulmonary exam normal breath sounds clear to auscultation       Cardiovascular negative cardio ROS Normal cardiovascular exam     Neuro/Psych negative psych ROS   GI/Hepatic negative GI ROS, Neg liver ROS,   Endo/Other  negative endocrine ROS  Renal/GU negative Renal ROS     Musculoskeletal   Abdominal (+) + obese,   Peds  Hematology   Anesthesia Other Findings   Reproductive/Obstetrics negative OB ROS                             Anesthesia Physical Anesthesia Plan  ASA: II  Anesthesia Plan: General   Post-op Pain Management:    Induction: Intravenous  Airway Management Planned: Oral ETT  Additional Equipment:   Intra-op Plan:   Post-operative Plan: Extubation in OR  Informed Consent: I have reviewed the patients History and Physical, chart, labs and discussed the procedure including the risks, benefits and alternatives for the proposed anesthesia with the patient or authorized representative who has indicated his/her understanding and acceptance.   Dental Advisory Given  Plan Discussed with: CRNA and Surgeon  Anesthesia Plan Comments:         Anesthesia Quick Evaluation

## 2015-12-27 NOTE — Op Note (Signed)
Preop Diagnosis: 1.Symptomatic Fibroids 2.Menorrhagia 3.Dysmenorrhea   Postop Diagnosis: 1.Symptomatic Fibroids 2.Menorrhagia 3.Dysmenorrhea   Procedure: 1.HYSTERECTOMY VAGINAL 2.BILATERAL SALPINGECTOMY 3.CYSTOSCOPY  Anesthesia: General   Attending: Everett Graff, MD   Assistant: Earnstine Regal, PA-C  Findings: Bilateral Paratubal Cysts  Pathology: Uterus, cervix, bilateral tubes and paratubal cysts (less than 250g)  Fluids: 1800 cc  UOP: 200 cc  EBL: [id number] cc  Complications: None  Procedure:The patient was taken to the operating room after the risks, benefits and alternatives were discussed with the patient. The patient verbalized understanding and consent signed and witnessed. The patient was placed under general anesthesia per the anesthesiologist and a timeout was performed per protocol. The patient was prepped and draped in the normal sterile fashion in the dorsal lithotomy position.  A weighted speculum was placed in the patient's vagina and the anterior lip of the cervix was grasped with a single-tooth tenaculum and Dever retractors were placed for vaginal wall retraction. The cervix was circumscribed with the bovie after injecting the cervix with pitressin at a concentration of 20 units of Pitressin in 50 cc of normal saline.  Once the cervix was circumscribed the anterior cul-de-sac was entered without difficulty.  The posterior cul-de-sac was entered without difficulty as well.  Curved Heaney clamps were used to clamp the uterosacral and cardinal ligaments and the tissue was then cut and suture ligated using 0 Vicryl. This was done bilaterally and sequentially. The remaining parametrial tissue was clamped, cut and suture ligated using 0 Vicryl in a sequential and bilateral fashion as well.  The uterine fundus was exteriorized and the remaining pedicles were bilaterally clamped, cut and suture ligated with 0 vicryl.  The uterus and cervix were handed off to be sent to pathology.  The bilateral ovaries and fallopian tubes appeared to be within normal limits.  A babcock was used to grasp the right fallopian tube and paratubal cyst which was then clamped with a kelly.  The tube was excised and sent to pathology.  The same was done on the contralateral side.  Some oozing was noted on the right and clamped and ligated with 2-0 vicryl with good hemostasis.  A McCall Culdoplasty stitch was placed.  The angles of the cuff were sutured using 0 vicryl. The cuff was then repaired to the midline with figure-of-eight and interrupted stitches of 0 Vicryl.  The cuff was noted to be hemostatic.    The patient tolerated the procedure well and was returned to the recovery room in good condition.

## 2015-12-27 NOTE — Progress Notes (Signed)
Day of Surgery Procedure(s) (LRB): HYSTERECTOMY VAGINAL (Bilateral) BILATERAL SALPINGECTOMY (Bilateral) CYSTOSCOPY (N/A)  Subjective: Patient reports feeling ok.  Itching quite a bit with the dilaudid.    Objective: I have reviewed patient's vital signs. UOP 200cc/last 2hrs  General: alert and no distress Resp: clear to auscultation bilaterally GI: soft, NT, decreased BS, ND Extremities: scds are on no calf tenderness  Assessment: s/p Procedure(s) with comments: HYSTERECTOMY VAGINAL (Bilateral) BILATERAL SALPINGECTOMY (Bilateral) - Excision of paratubal cyst CYSTOSCOPY (N/A): stable  Plan: recovering s/p TVH/BS/Cystoscopy  Adequate UOP SCDs are on  Encourage IS Change to morphine PCA    LOS: 1 day    Kelly Alvarado Y 12/27/2015, 11:49 PM

## 2015-12-27 NOTE — Anesthesia Postprocedure Evaluation (Addendum)
Anesthesia Post Note  Patient: Kelly Alvarado  Procedure(s) Performed: Procedure(s) (LRB): HYSTERECTOMY VAGINAL (Bilateral) BILATERAL SALPINGECTOMY (Bilateral) CYSTOSCOPY (N/A)  Patient location during evaluation: PACU Anesthesia Type: General Level of consciousness: awake Pain management: pain level not controlled Vital Signs Assessment: post-procedure vital signs reviewed and stable Respiratory status: spontaneous breathing Cardiovascular status: stable Postop Assessment: no signs of nausea or vomiting Anesthetic complications: no     Last Vitals:  Vitals:   12/27/15 1245 12/27/15 1251  BP: 118/75   Pulse: 77 77  Resp: 15 14  Temp:      Last Pain:  Vitals:   12/27/15 1251  TempSrc:   PainSc: 7    Pain Goal: Patients Stated Pain Goal: 4 (12/27/15 0724)               [place]

## 2015-12-27 NOTE — Interval H&P Note (Signed)
History and Physical Interval Note:  12/27/2015 8:26 AM  Neita Goodnight  has presented today for surgery, with the diagnosis of Menorrhagia,Dysmenorrhea  The various methods of treatment have been discussed with the patient and family. After consideration of risks, benefits and other options for treatment, the patient has consented to  Procedure(s) with comments: HYSTERECTOMY VAGINAL (Bilateral) BILATERAL SALPINGECTOMY (Bilateral) - Excision of paratubal cyst CYSTOSCOPY (N/A) POSSIBLE ABDOMINAL HYSTERECTOMY as a surgical intervention .  The patient's history has been reviewed, patient examined, no change in status, stable for surgery.  I have reviewed the patient's chart and labs.  Questions were answered to the patient's satisfaction.     Delice Lesch

## 2015-12-27 NOTE — Anesthesia Postprocedure Evaluation (Signed)
Anesthesia Post Note  Patient: Kelly Alvarado  Procedure(s) Performed: Procedure(s) (LRB): HYSTERECTOMY VAGINAL (Bilateral) BILATERAL SALPINGECTOMY (Bilateral) CYSTOSCOPY (N/A)  Patient location during evaluation: [facility] Anesthesia Type: General Level of consciousness: awake and alert Pain management: satisfactory to patient Vital Signs Assessment: post-procedure vital signs reviewed and stable Respiratory status: spontaneous breathing and respiratory function stable Cardiovascular status: stable Postop Assessment: adequate PO intake and no signs of nausea or vomiting Anesthetic complications: no     Last Vitals:  Vitals:   12/27/15 1540 12/27/15 1640  BP: 114/66 126/64  Pulse: 85 75  Resp: 14 16  Temp: 37 C 36.9 C    Last Pain:  Vitals:   12/27/15 1845  TempSrc:   PainSc: 5    Pain Goal: Patients Stated Pain Goal: 5 (12/27/15 1335)               Anneliese Leblond

## 2015-12-28 ENCOUNTER — Telehealth: Payer: Self-pay | Admitting: Neurology

## 2015-12-28 ENCOUNTER — Other Ambulatory Visit: Payer: Self-pay | Admitting: Neurology

## 2015-12-28 DIAGNOSIS — N920 Excessive and frequent menstruation with regular cycle: Secondary | ICD-10-CM | POA: Diagnosis not present

## 2015-12-28 LAB — CBC
HCT: 26.1 % — ABNORMAL LOW (ref 36.0–46.0)
Hemoglobin: 9.3 g/dL — ABNORMAL LOW (ref 12.0–15.0)
MCH: 31.1 pg (ref 26.0–34.0)
MCHC: 35.6 g/dL (ref 30.0–36.0)
MCV: 87.3 fL (ref 78.0–100.0)
PLATELETS: 264 10*3/uL (ref 150–400)
RBC: 2.99 MIL/uL — AB (ref 3.87–5.11)
RDW: 13.9 % (ref 11.5–15.5)
WBC: 6.7 10*3/uL (ref 4.0–10.5)

## 2015-12-28 MED ORDER — IBUPROFEN 600 MG PO TABS
600.0000 mg | ORAL_TABLET | Freq: Four times a day (QID) | ORAL | Status: DC
  Administered 2015-12-28 (×2): 600 mg via ORAL
  Filled 2015-12-28 (×2): qty 1

## 2015-12-28 MED ORDER — HYDROCODONE-ACETAMINOPHEN 5-325 MG PO TABS
1.0000 | ORAL_TABLET | Freq: Four times a day (QID) | ORAL | Status: DC | PRN
  Administered 2015-12-28: 2 via ORAL
  Filled 2015-12-28 (×2): qty 2

## 2015-12-28 MED ORDER — DIPHENHYDRAMINE HCL 25 MG PO CAPS
25.0000 mg | ORAL_CAPSULE | Freq: Four times a day (QID) | ORAL | Status: DC | PRN
  Administered 2015-12-28: 25 mg via ORAL
  Filled 2015-12-28: qty 1

## 2015-12-28 MED ORDER — SODIUM CHLORIDE 0.9% FLUSH: 9.0000 mL | INTRAVENOUS | Status: DC | PRN

## 2015-12-28 MED ORDER — TOPIRAMATE 100 MG PO TABS: 100.0000 mg | ORAL_TABLET | Freq: Every day | ORAL | 3 refills | Status: DC

## 2015-12-28 MED ORDER — ONDANSETRON HCL 4 MG/2ML IJ SOLN: 4.0000 mg | Freq: Four times a day (QID) | INTRAMUSCULAR | Status: DC | PRN

## 2015-12-28 MED ORDER — DIPHENHYDRAMINE HCL 50 MG/ML IJ SOLN: 12.5000 mg | Freq: Four times a day (QID) | INTRAMUSCULAR | Status: DC | PRN

## 2015-12-28 MED ORDER — HYDROCODONE-ACETAMINOPHEN 7.5-325 MG PO TABS: 1.0000 | ORAL_TABLET | Freq: Four times a day (QID) | ORAL | 0 refills | Status: DC | PRN

## 2015-12-28 MED ORDER — DIPHENHYDRAMINE HCL 12.5 MG/5ML PO ELIX
12.5000 mg | ORAL_SOLUTION | Freq: Four times a day (QID) | ORAL | Status: DC | PRN
  Administered 2015-12-28: 12.5 mg via ORAL
  Filled 2015-12-28 (×2): qty 5

## 2015-12-28 MED ORDER — NALOXONE HCL 0.4 MG/ML IJ SOLN: 0.4000 mg | INTRAMUSCULAR | Status: DC | PRN

## 2015-12-28 MED ORDER — DIPHENHYDRAMINE HCL 12.5 MG/5ML PO ELIX: 12.5000 mg | ORAL_SOLUTION | Freq: Four times a day (QID) | ORAL | Status: DC | PRN

## 2015-12-28 MED ORDER — IBUPROFEN 600 MG PO TABS: ORAL_TABLET | ORAL | 1 refills | Status: DC

## 2015-12-28 MED ORDER — HEPARIN SODIUM (PORCINE) 5000 UNIT/ML IJ SOLN: 5000.0000 [IU] | Freq: Two times a day (BID) | INTRAMUSCULAR | 0 refills | Status: DC

## 2015-12-28 MED ORDER — MORPHINE SULFATE 2 MG/ML IV SOLN
INTRAVENOUS | Status: DC
  Administered 2015-12-28: via INTRAVENOUS
  Administered 2015-12-28: 6 mg via INTRAVENOUS
  Administered 2015-12-28: 13.5 mg via INTRAVENOUS
  Filled 2015-12-28: qty 25

## 2015-12-28 MED ORDER — DIPHENHYDRAMINE HCL 12.5 MG/5ML PO ELIX
12.5000 mg | ORAL_SOLUTION | Freq: Once | ORAL | Status: AC
  Administered 2015-12-28: 12.5 mg via ORAL

## 2015-12-28 NOTE — Progress Notes (Signed)
Discharge teaching complete. Pt understood all information and did not have any questions. Pt ambulated out of the hospital and discharged home to family.  

## 2015-12-28 NOTE — Discharge Instructions (Signed)
Call [facility] @ [phone] if: ° °You have a temperature greater than or equal to 100.4 degrees Farenheit orally °You have pain that is not made better by the pain medication given and taken as directed °You have excessive bleeding or problems urinating ° °Take Colace (Docusate Sodium/Stool Softener) 100 mg 2-3 times daily while taking narcotic pain medicine to avoid constipation or until bowel movements are regular. °Take Ibuprofen 600 mg with food every 6 hours for 5 days then as needed for pain ° °You may drive after 2  weeks °You may walk up steps ° °You may shower  °You may resume a regular diet ° °Do not lift over 15 pounds for 6 weeks °Avoid anything in vagina for 6 weeks (or until after your post-operative visit) ° °

## 2015-12-28 NOTE — Progress Notes (Signed)
MUNEERAH IMLAY is a89 y.o.  [id number]  Post Op Date # 1:  TVH/BS  Subjective: Patient is Doing well postoperatively. However, complains of itching. Patient has Pain is controlled with current analgesics. Medications being used: narcotic analgesics including morphine. Ambulating without lightheadedness.  Tolerating  liquids with no nausea.  Hasn't passed flatus or voided since Foley removed.   Objective: Vital signs in last 24 hours: Temp:  [97.8 F (36.6 C)-99 F (37.2 C)] 99 F (37.2 C) (11/22 0353) Pulse Rate:  [68-95] 77 (11/22 0353) Resp:  [12-21] 16 (11/22 0517) BP: (108-141)/(35-82) 141/66 (11/22 0353) SpO2:  [98 %-100 %] 98 % (11/22 0517)  Intake/Output from previous day: 11/21 0701 - 11/22 0700 In: 4233.3 [P.O.:1040; I.V.:3193.3] Out: [id number] [Urine:3175] Intake/Output this shift: Total I/O In: 2793.3 [P.O.:800; I.V.:1993.3] Out: 2350 [Urine:2350]  Recent Labs Lab 12/27/15 1452 12/28/15 0514  WBC 10.3 6.7  HGB 11.6* 9.3*  HCT 33.8* 26.1*  PLT 193 264     Recent Labs Lab 12/27/15 1452  CREATININE 1.04*    EXAM: General: alert, cooperative and complaining of itching. Resp: clear to auscultation bilaterally Cardio: regular rate and rhythm, S1, S2 normal, no murmur, click, rub or gallop GI: Decreased bowel sounds and soft. Extremities: Homans sign is negative, no sign of DVT and no calf tenderness (SCD hose in place and functioning)   Assessment: s/p Procedure(s): HYSTERECTOMY VAGINAL BILATERAL SALPINGECTOMY CYSTOSCOPY: stable, progressing well, anemia and Pruritis  Plan: Advance diet Encourage ambulation Advance to PO medication Consider discharge later today   LOS: 1 day    Bunyan Brier, PA-C 12/28/2015 6:42 AM

## 2015-12-28 NOTE — Progress Notes (Signed)
In anticipation of patient going home on Heparin injections, taught patient how to draw afternoon dose into syringe and how to self-administer.  Pt has experience with SQ injections from a previous medication for her MS.  Pt completed self-injection using correct technique with RN cueing.

## 2015-12-28 NOTE — Telephone Encounter (Signed)
I have spoken with Kelly Alvarado--she sts she had surgery yesterday and Topamax was dispensed in the hospital.  Sts. she is taking 100mg  qhs.  New rx. escribed to [facility] per her request/fim

## 2015-12-28 NOTE — Addendum Note (Signed)
Addended by: France Ravens I on: 12/28/2015 02:44 PM   Modules accepted: Orders

## 2015-12-28 NOTE — Telephone Encounter (Signed)
I have spoken with [facility]ist at [facility] and explained it appears pt. received med from another provider Caryn Bee, [place], and it appears directions have been changed by that provider./fim

## 2015-12-28 NOTE — Discharge Summary (Signed)
Physician Discharge Summary  Patient ID: Kelly Alvarado MRN: [id number] DOB/AGE: [birth year] 40 y.o.  Admit date: 12/27/2015 Discharge date: 12/28/2015   Discharge Diagnoses: Menorrhagia and Uterine Fibroids Active Problems:   Menorrhagia   Operation: TVH/BS/Cystoscopy   Discharged Condition: Good  Hospital Course: Pt recovered well s/p TVH.  VSS and tolerating po.  PT going home on SQ heparin q12 secondary to h/o PE.  Disposition: 01-Home or Self Care  Discharge Medications: Med Rec must be completed prior to using this [place] Vicodin SQ hepain       Follow-up: February 07, 2016 at 4:15 a.m.   SignedEarnstine Regal, PA-C 12/28/2015, 7:05 AM

## 2015-12-28 NOTE — Telephone Encounter (Signed)
Patient requesting refill of topiramate (TOPAMAX) 100 MG tablet [facility]acy: [id number] - [place], [address]  Pt is out of medication

## 2016-01-02 ENCOUNTER — Encounter (HOSPITAL_COMMUNITY): Payer: Self-pay | Admitting: Obstetrics and Gynecology

## 2016-01-03 ENCOUNTER — Encounter: Payer: Self-pay | Admitting: Neurology

## 2016-01-03 ENCOUNTER — Ambulatory Visit (INDEPENDENT_AMBULATORY_CARE_PROVIDER_SITE_OTHER): Payer: BC Managed Care – PPO | Admitting: Neurology

## 2016-01-03 VITALS — BP 124/80 | HR 74 | Resp 16 | Ht 65.0 in | Wt 204.0 lb

## 2016-01-03 DIAGNOSIS — G35 Multiple sclerosis: Secondary | ICD-10-CM | POA: Diagnosis not present

## 2016-01-03 DIAGNOSIS — G4700 Insomnia, unspecified: Secondary | ICD-10-CM

## 2016-01-03 DIAGNOSIS — R5383 Other fatigue: Secondary | ICD-10-CM | POA: Diagnosis not present

## 2016-01-03 DIAGNOSIS — G43009 Migraine without aura, not intractable, without status migrainosus: Secondary | ICD-10-CM | POA: Diagnosis not present

## 2016-01-03 DIAGNOSIS — R208 Other disturbances of skin sensation: Secondary | ICD-10-CM

## 2016-01-03 MED ORDER — HYDROCODONE-ACETAMINOPHEN 7.5-325 MG PO TABS: 1.0000 | ORAL_TABLET | Freq: Four times a day (QID) | ORAL | 0 refills | Status: DC | PRN

## 2016-01-03 MED ORDER — INDOMETHACIN 25 MG PO CAPS: 25.0000 mg | ORAL_CAPSULE | Freq: Two times a day (BID) | ORAL | 1 refills | Status: DC

## 2016-01-03 NOTE — Progress Notes (Signed)
[facility]  PATIENT: Kelly Alvarado DOB: [birth year]  REFERRING DOCTOR OR PCP:  [name]  SOURCE: patient and records  _________________________________   HISTORICAL  CHIEF COMPLAINT:  Chief Complaint  Patient presents with  . Multiple Sclerosis    Sts. she continues to tolerate Gilenya well.  Denies new or worse sx.  She is currently recovering from surgery--had a partial hysterectomy one week ago./fim    HISTORY OF PRESENT ILLNESS:  Kelly Alvarado is a 40 year old woman with MS.     MS:   She feels stable and denies any new symptoms since the last visit.   She is taking Gilenya daily and tolerates it well. She has not had any definite exacerbations while she has taken the medication.    Dysesthesia:   She is having electric shock like sensations all over but worse in right arm and legs.    She feels these dysesthesias are slightly better since starting Topamax and she tolerates it well.    She denies Lhermite's signs.  Shocks are random and frequent.     She had more a hysterectomy last week and had more dysesthesia afterwards, like the right leg was on fire.             Gait/strength: She feels her gait returned to normal after initial exacerbation. Strength  is normal in the limbs.  Vision: She has mild reduced color saturation out of the right eye and notes mild blurriness at times. No diplopia.    Bladder:   She has occasional urinary frequency or hesitancy. No incontinence.   No hesitancy  Fatigue/sleep: She notes fatigue that gets worse as the day goes on. This is both physical and cognitive. She has gained some weight even though she is trying to stay active. Insomnia is a little worse (both sleep onset and maintenance).        Mood/cognition:   She denies any depression or anxiety. No major cognitive dysfunction but does have a little bit of issues at times. Mostly she finds that she has decreased focus and attention. In the past, she was  diagnosed with ADD. Vyvanse was not well tolerated.    Migraines: She is having migraines about 7-8 days a month. Typically migraine lasts 2 days. There occur randomly or associated with her cycle.   When present, the pain is usually over the left eye and is constant, more than throbbing. When severe it is bilateral.   She will sometimes have a mild visual aura before the migraine. When present, she will have photophobia and phonophobia. She will get nausea and occasionally will get vomiting. Moving will make the headaches worse.  Sometimes being touch in the head will trigger a migraine. Topamax at low dose did not help much.   A higher dose is being tried d much.    Triptan such as Imitrex and Maxalt have not really helped reduce the headache pain. Fioricet had not helped in the past. Midrin and Hydrocodone or indomethacin often help some.       MS History:   She was diagnosed with multiple sclerosis in 2010 after presenting with a left foot drop. MRI of the brain was consistent with multiple sclerosis.   She was  started on Rebif. The MS was well controlled but she felt poorly on that medication. She transferred to [facility] and began to see me in 2012. She was placed on Tysabri for about a year and felt much better while on  it. Her MS did well on Tysabri as well.  Unfortunately, she was JCV positive and after about a year she stopped Tysabri and started Gilenya. She has done fairly well on Gilenya with no definite exacerbations.      REVIEW OF SYSTEMS: Constitutional: No fevers, chills, sweats, or change in appetite.  She notes fatigue and insomnia. Eyes: No visual changes, double vision, eye pain Ear, nose and throat: No hearing loss, ear pain, nasal congestion, sore throat Cardiovascular: No chest pain, palpitations Respiratory: No shortness of breath at rest or with exertion.   No wheezes GastrointestinaI: No nausea, vomiting, diarrhea, abdominal pain, fecal  incontinence Genitourinary: No dysuria, urinary retention or frequency.  No nocturia. Musculoskeletal: No neck pain, back .  Notes myalgias at times Integumentary: No rash, pruritus, skin lesions Neurological: as above Psychiatric: No depression at this time.  No anxiety Endocrine: No palpitations, diaphoresis, change in appetite, change in weigh or increased thirst Hematologic/Lymphatic: No anemia, purpura, petechiae. Allergic/Immunologic: No itchy/runny eyes, nasal congestion, recent allergic reactions, rashes.  She has some food allergies.  ALLERGIES: Allergies  Allergen Reactions  . Amoxicillin Hives    Has patient had a PCN reaction causing immediate rash, facial/tongue/throat swelling, SOB or lightheadedness with hypotension: Yes Has patient had a PCN reaction causing severe rash involving mucus membranes or skin necrosis: Yes Has patient had a PCN reaction that required hospitalization No Has patient had a PCN reaction occurring within the last 10 years: No If all of the above answers are "NO", then may proceed with Cephalosporin use.   . Milk-Related Compounds Diarrhea and Nausea And Vomiting    Stomach cramps    HOME MEDICATIONS:  Current Outpatient Prescriptions:  .  Fingolimod HCl (GILENYA) 0.5 MG CAPS, Take 1 capsule (0.5 mg total) by mouth daily., Disp: 90 capsule, Rfl: 1 .  heparin 5000 UNIT/ML injection, Inject 1 mL (5,000 Units total) into the skin every 12 (twelve) hours., Disp: 28 mL, Rfl: 0 .  HYDROcodone-acetaminophen (NORCO) 7.5-325 MG tablet, Take 1 tablet by mouth every 6 (six) hours as needed for moderate pain., Disp: 30 tablet, Rfl: 0 .  levocetirizine (XYZAL) 5 MG tablet, Take 5 mg by mouth every evening., Disp: , Rfl:  .  naproxen (NAPRELAN) 500 MG 24 hr tablet, Take 1 tablet by mouth 3 (three) times daily as needed (cramps). , Disp: , Rfl:  .  indomethacin (INDOCIN) 25 MG capsule, Take 1 capsule (25 mg total) by mouth 2 (two) times daily with a meal.,  Disp: 60 capsule, Rfl: 1  PAST MEDICAL HISTORY: Past Medical History:  Diagnosis Date  . Allergy   . Anemia    HEAVY PERIODS  . Headache(784.0)    MIGRAINES  . Multiple sclerosis ([place])   . Neuromuscular disorder ([place])    MS  . Pulmonary embolism ([place]) 2013   lt-?bcp  . Wears glasses     PAST SURGICAL HISTORY: Past Surgical History:  Procedure Laterality Date  . BILATERAL SALPINGECTOMY Bilateral 12/27/2015   Procedure: BILATERAL SALPINGECTOMY;  Surgeon: Everett Graff, MD;  Location: [facility];  Service: Gynecology;  Laterality: Bilateral;  Excision of paratubal cyst  . BREAST LUMPECTOMY WITH RADIOACTIVE SEED LOCALIZATION Left 10/06/2013   Procedure:  RADIOACTIVE SEED LOCALIZATION LEFT BREAST LUMPECTOMY;  Surgeon: Odis Hollingshead, MD;  Location: [facility];  Service: General;  Laterality: Left;  . BREAST REDUCTION SURGERY    . CYSTOSCOPY N/A 12/27/2015   Procedure: CYSTOSCOPY;  Surgeon: Everett Graff, MD;  Location: [facility];  Service: Gynecology;  Laterality: N/A;  . VAGINAL HYSTERECTOMY Bilateral 12/27/2015   Procedure: HYSTERECTOMY VAGINAL;  Surgeon: Everett Graff, MD;  Location: [facility];  Service: Gynecology;  Laterality: Bilateral;  . WISDOM TOOTH EXTRACTION      FAMILY HISTORY: Family History  Problem Relation Age of Onset  . Asthma Mother   . Hypertension Father   . Cancer Paternal Uncle   . Cancer Paternal Grandfather     SOCIAL HISTORY:  Social History   Social History  . Marital status: Married    Spouse name: N/A  . Number of children: N/A  . Years of education: N/A   Occupational History  . Not on file.   Social History Main Topics  . Smoking status: Never Smoker  . Smokeless tobacco: Never Used  . Alcohol use 0.0 oz/week     Comment: occ  . Drug use: No  . Sexual activity: Yes    Birth control/ protection: None     Comment: VASECTOMY   Other Topics Concern  . Not on file   Social History Narrative  . No narrative on file      PHYSICAL EXAM  Vitals:   01/03/16 0932  BP: 124/80  Pulse: 74  Resp: 16  Weight: 204 lb (92.5 kg)  Height: 5\' 5"  (1.651 m)    Body mass index is 33.95 kg/m.   General: The patient is well-developed and well-nourished and in no acute distress   Musculoskeletal:  Neck is non-tender.    Neurologic Exam  Mental status: The patient is alert and oriented x 3 at the time of the examination. The patient has apparent normal recent and remote memory, with an apparently normal attention span and concentration ability.   Speech is normal.  Cranial nerves: Extraocular movements are full.   Mild reduced color vision on the right There is good facial sensation to soft touch bilaterally.Facial strength is normal.  Trapezius and sternocleidomastoid strength is normal. No dysarthria is noted.  The tongue is midline, and the patient has symmetric elevation of the soft palate. No obvious hearing deficits are noted.  Motor:  Muscle bulk is normal.   Tone is normal. Strength is  5 / 5 in all 4 extremities.   Sensory: Sensory testing is intact to pinprick, soft touch and vibration sensation in all 4 extremities.  Coordination: Cerebellar testing reveals good finger-nose-finger and heel-to-shin bilaterally.  Gait and station: Station is normal.   Gait is normal. Tandem gait is wide. Romberg is negative.   Reflexes: Deep tendon reflexes are symmetric and normal bilaterally.        DIAGNOSTIC DATA (LABS, IMAGING, TESTING) - I reviewed patient records, labs, notes, testing and imaging myself where available.  Lab Results  Component Value Date   WBC 6.7 12/28/2015   HGB 9.3 (L) 12/28/2015   HCT 26.1 (L) 12/28/2015   MCV 87.3 12/28/2015   PLT 264 12/28/2015        ASSESSMENT AND PLAN  Multiple sclerosis (HCC)  Common migraine without intractability  Other fatigue  Insomnia, unspecified type  Dysesthesia   1.   Continue Gilenya. She had recent blood work at the  hospital.. 2.   When headache occurs, she can take hydrocodone and/or indomethacin. 3.   Stay active and exercises.  4.   Melatonin 3 - 5 mg nightly about 90 minutes before bedtime.  She will return in 6 months or sooner if there are new or worsening neurologic symptoms.   Densel Kronick A. Felecia Shelling, MD,  PhD [id number], [id number] AM Certified in Neurology, Clinical Neurophysiology, Sleep Medicine, Pain Medicine and Neuroimaging  [facility] [address], [place], [place] [address] [phone]

## 2016-01-09 ENCOUNTER — Ambulatory Visit: Admit: 2016-01-09 | Payer: BC Managed Care – PPO | Admitting: Obstetrics and Gynecology

## 2016-01-09 ENCOUNTER — Inpatient Hospital Stay (HOSPITAL_COMMUNITY): Payer: BC Managed Care – PPO | Admitting: Anesthesiology

## 2016-01-09 ENCOUNTER — Encounter (HOSPITAL_COMMUNITY)
Admission: AD | Disposition: A | Discharge status: Home / Self Care | Payer: Self-pay | Source: Ambulatory Visit | Attending: Obstetrics and Gynecology

## 2016-01-09 ENCOUNTER — Inpatient Hospital Stay (HOSPITAL_COMMUNITY)
Admission: AD | Admit: 2016-01-09 | Discharge: 2016-01-09 | Disposition: A | Discharge status: Home / Self Care | Payer: BC Managed Care – PPO | Source: Ambulatory Visit | Attending: Obstetrics and Gynecology | Admitting: Obstetrics and Gynecology

## 2016-01-09 ENCOUNTER — Encounter (HOSPITAL_COMMUNITY): Payer: Self-pay

## 2016-01-09 DIAGNOSIS — N939 Abnormal uterine and vaginal bleeding, unspecified: Secondary | ICD-10-CM | POA: Diagnosis present

## 2016-01-09 DIAGNOSIS — G43909 Migraine, unspecified, not intractable, without status migrainosus: Secondary | ICD-10-CM | POA: Diagnosis not present

## 2016-01-09 DIAGNOSIS — G4700 Insomnia, unspecified: Secondary | ICD-10-CM | POA: Insufficient documentation

## 2016-01-09 DIAGNOSIS — Z86711 Personal history of pulmonary embolism: Secondary | ICD-10-CM | POA: Insufficient documentation

## 2016-01-09 DIAGNOSIS — Z90710 Acquired absence of both cervix and uterus: Secondary | ICD-10-CM | POA: Insufficient documentation

## 2016-01-09 DIAGNOSIS — Z881 Allergy status to other antibiotic agents status: Secondary | ICD-10-CM | POA: Insufficient documentation

## 2016-01-09 DIAGNOSIS — Z79899 Other long term (current) drug therapy: Secondary | ICD-10-CM | POA: Diagnosis not present

## 2016-01-09 DIAGNOSIS — Z825 Family history of asthma and other chronic lower respiratory diseases: Secondary | ICD-10-CM | POA: Diagnosis not present

## 2016-01-09 DIAGNOSIS — X58XXXA Exposure to other specified factors, initial encounter: Secondary | ICD-10-CM | POA: Insufficient documentation

## 2016-01-09 DIAGNOSIS — Z809 Family history of malignant neoplasm, unspecified: Secondary | ICD-10-CM | POA: Diagnosis not present

## 2016-01-09 DIAGNOSIS — Z91011 Allergy to milk products: Secondary | ICD-10-CM | POA: Insufficient documentation

## 2016-01-09 DIAGNOSIS — G35 Multiple sclerosis: Secondary | ICD-10-CM | POA: Insufficient documentation

## 2016-01-09 DIAGNOSIS — Z8249 Family history of ischemic heart disease and other diseases of the circulatory system: Secondary | ICD-10-CM | POA: Insufficient documentation

## 2016-01-09 DIAGNOSIS — T8132XA Disruption of internal operation (surgical) wound, not elsewhere classified, initial encounter: Secondary | ICD-10-CM | POA: Insufficient documentation

## 2016-01-09 HISTORY — PX: REPAIR VAGINAL CUFF: SHX6067

## 2016-01-09 LAB — COMPREHENSIVE METABOLIC PANEL
ALBUMIN: 4.3 g/dL (ref 3.5–5.0)
ALT: 27 U/L (ref 14–54)
ANION GAP: 9 (ref 5–15)
AST: 21 U/L (ref 15–41)
Alkaline Phosphatase: 92 U/L (ref 38–126)
BUN: 8 mg/dL (ref 6–20)
CO2: 19 mmol/L — AB (ref 22–32)
Calcium: 9.7 mg/dL (ref 8.9–10.3)
Chloride: 112 mmol/L — ABNORMAL HIGH (ref 101–111)
Creatinine, Ser: 0.88 mg/dL (ref 0.44–1.00)
GFR calc non Af Amer: >60 mL/min (ref >60)
GLUCOSE: 99 mg/dL (ref 65–99)
POTASSIUM: 3.8 mmol/L (ref 3.5–5.1)
SODIUM: 140 mmol/L (ref 135–145)
Total Bilirubin: 0.7 mg/dL (ref 0.3–1.2)
Total Protein: 7.6 g/dL (ref 6.5–8.1)

## 2016-01-09 LAB — TYPE AND SCREEN
ABO/RH(D): O POS
Antibody Screen: NEGATIVE

## 2016-01-09 LAB — CBC
HEMATOCRIT: 41.1 % (ref 36.0–46.0)
Hemoglobin: 14.1 g/dL (ref 12.0–15.0)
MCH: 30.3 pg (ref 26.0–34.0)
MCHC: 34.3 g/dL (ref 30.0–36.0)
MCV: 88.4 fL (ref 78.0–100.0)
Platelets: 315 10*3/uL (ref 150–400)
RBC: 4.65 MIL/uL (ref 3.87–5.11)
RDW: 14 % (ref 11.5–15.5)
WBC: 2.9 10*3/uL — AB (ref 4.0–10.5)

## 2016-01-09 LAB — ABO/RH: ABO/RH(D): O POS

## 2016-01-09 SURGERY — REPAIR, VAGINAL CUFF
Anesthesia: General | Site: Vagina

## 2016-01-09 MED ORDER — MIDAZOLAM HCL 2 MG/2ML IJ SOLN
INTRAMUSCULAR | Status: AC
  Filled 2016-01-09: qty 2

## 2016-01-09 MED ORDER — CLINDAMYCIN HCL 300 MG PO CAPS: 300.0000 mg | ORAL_CAPSULE | Freq: Two times a day (BID) | ORAL | 0 refills | Status: AC

## 2016-01-09 MED ORDER — ONDANSETRON HCL 4 MG/2ML IJ SOLN
INTRAMUSCULAR | Status: DC | PRN
  Administered 2016-01-09: 4 mg via INTRAVENOUS

## 2016-01-09 MED ORDER — FENTANYL CITRATE (PF) 100 MCG/2ML IJ SOLN
25.0000 ug | INTRAMUSCULAR | Status: DC | PRN
  Administered 2016-01-09: 50 ug via INTRAVENOUS

## 2016-01-09 MED ORDER — FENTANYL CITRATE (PF) 100 MCG/2ML IJ SOLN
INTRAMUSCULAR | Status: DC | PRN
  Administered 2016-01-09 (×2): 50 ug via INTRAVENOUS

## 2016-01-09 MED ORDER — ONDANSETRON HCL 4 MG/2ML IJ SOLN
INTRAMUSCULAR | Status: AC
  Filled 2016-01-09: qty 2

## 2016-01-09 MED ORDER — PROPOFOL 10 MG/ML IV BOLUS
INTRAVENOUS | Status: DC | PRN
  Administered 2016-01-09: 170 mg via INTRAVENOUS

## 2016-01-09 MED ORDER — LIDOCAINE HCL (CARDIAC) 20 MG/ML IV SOLN
INTRAVENOUS | Status: DC | PRN
  Administered 2016-01-09: 40 mg via INTRAVENOUS

## 2016-01-09 MED ORDER — DEXTROSE 5 % IV SOLN
INTRAVENOUS | Status: DC
  Filled 2016-01-09: qty 8.75

## 2016-01-09 MED ORDER — CLINDAMYCIN PHOSPHATE 900 MG/50ML IV SOLN
900.0000 mg | Freq: Once | INTRAVENOUS | Status: AC
  Administered 2016-01-09: 900 mg via INTRAVENOUS
  Filled 2016-01-09: qty 50

## 2016-01-09 MED ORDER — LACTATED RINGERS IV SOLN
INTRAVENOUS | Status: DC
  Administered 2016-01-09 (×2): via INTRAVENOUS

## 2016-01-09 MED ORDER — MIDAZOLAM HCL 5 MG/5ML IJ SOLN
INTRAMUSCULAR | Status: DC | PRN
  Administered 2016-01-09: 2 mg via INTRAVENOUS

## 2016-01-09 MED ORDER — ENOXAPARIN SODIUM 40 MG/0.4ML ~~LOC~~ SOLN: 40.0000 mg | Freq: Every day | SUBCUTANEOUS | 0 refills | Status: DC

## 2016-01-09 MED ORDER — LIDOCAINE HCL (CARDIAC) 20 MG/ML IV SOLN
INTRAVENOUS | Status: AC
Start: 2016-01-09 — End: 2016-01-09
  Filled 2016-01-09: qty 5

## 2016-01-09 MED ORDER — DEXAMETHASONE SODIUM PHOSPHATE 10 MG/ML IJ SOLN
INTRAMUSCULAR | Status: DC | PRN
  Administered 2016-01-09: 4 mg via INTRAVENOUS

## 2016-01-09 MED ORDER — FENTANYL CITRATE (PF) 100 MCG/2ML IJ SOLN
INTRAMUSCULAR | Status: AC
  Filled 2016-01-09: qty 2

## 2016-01-09 MED ORDER — FAMOTIDINE IN NACL 20-0.9 MG/50ML-% IV SOLN
20.0000 mg | Freq: Once | INTRAVENOUS | Status: AC
  Administered 2016-01-09: 20 mg via INTRAVENOUS
  Filled 2016-01-09: qty 50

## 2016-01-09 MED ORDER — DEXAMETHASONE SODIUM PHOSPHATE 10 MG/ML IJ SOLN
INTRAMUSCULAR | Status: AC
  Filled 2016-01-09: qty 1

## 2016-01-09 MED ORDER — LACTATED RINGERS IV BOLUS (SEPSIS)
1000.0000 mL | Freq: Once | INTRAVENOUS | Status: AC
  Administered 2016-01-09: 1000 mL via INTRAVENOUS

## 2016-01-09 MED ORDER — PROPOFOL 10 MG/ML IV BOLUS
INTRAVENOUS | Status: AC
  Filled 2016-01-09: qty 20

## 2016-01-09 MED ORDER — GENTAMICIN SULFATE 40 MG/ML IJ SOLN
5.0000 mg/kg | INTRAVENOUS | Status: AC
  Administered 2016-01-09: 350 mg via INTRAVENOUS
  Filled 2016-01-09: qty 8.75

## 2016-01-09 MED ORDER — SOD CITRATE-CITRIC ACID 500-334 MG/5ML PO SOLN
30.0000 mL | Freq: Once | ORAL | Status: AC
  Administered 2016-01-09: 30 mL via ORAL
  Filled 2016-01-09: qty 15

## 2016-01-09 MED ORDER — SODIUM CHLORIDE 0.9 % IJ SOLN
Freq: Once | INTRAMUSCULAR | Status: AC
  Administered 2016-01-09: 30 mL via VAGINAL
  Filled 2016-01-09: qty 1

## 2016-01-09 SURGICAL SUPPLY — 25 items
CATH ROBINSON RED A/P 16FR (CATHETERS) ×6 IMPLANT
CLOTH BEACON ORANGE TIMEOUT ST (SAFETY) ×3 IMPLANT
DECANTER SPIKE VIAL GLASS SM (MISCELLANEOUS) IMPLANT
GAUZE PACKING 1 X5 YD ST (GAUZE/BANDAGES/DRESSINGS) IMPLANT
GAUZE SPONGE 4X4 16PLY XRAY LF (GAUZE/BANDAGES/DRESSINGS) ×3 IMPLANT
GLOVE BIO SURGEON STRL SZ 6.5 (GLOVE) ×2 IMPLANT
GLOVE BIO SURGEON STRL SZ7.5 (GLOVE) ×3 IMPLANT
GLOVE BIO SURGEONS STRL SZ 6.5 (GLOVE) ×1
GLOVE BIOGEL PI IND STRL 7.0 (GLOVE) ×1 IMPLANT
GLOVE BIOGEL PI IND STRL 7.5 (GLOVE) ×2 IMPLANT
GLOVE BIOGEL PI INDICATOR 7.0 (GLOVE) ×2
GLOVE BIOGEL PI INDICATOR 7.5 (GLOVE) ×4
GOWN STRL REUS W/TWL LRG LVL3 (GOWN DISPOSABLE) ×6 IMPLANT
NS IRRIG 1000ML POUR BTL (IV SOLUTION) ×3 IMPLANT
PACK VAGINAL MINOR WOMEN LF (CUSTOM PROCEDURE TRAY) ×3 IMPLANT
PACK VAGINAL WOMENS (CUSTOM PROCEDURE TRAY) IMPLANT
SPONGE SURGIFOAM ABS GEL 12-7 (HEMOSTASIS) ×6 IMPLANT
SUT VIC AB 0 CT1 36 (SUTURE) ×9 IMPLANT
SYR BULB IRRIGATION 50ML (SYRINGE) ×3 IMPLANT
TOWEL OR 17X24 6PK STRL BLUE (TOWEL DISPOSABLE) ×6 IMPLANT
TRAY FOLEY CATH SILVER 14FR (SET/KITS/TRAYS/PACK) IMPLANT
TUBING NON-CON 1/4 X 20 CONN (TUBING) ×2 IMPLANT
TUBING NON-CON 1/4 X 20' CONN (TUBING) ×1
WATER STERILE IRR 1000ML POUR (IV SOLUTION) IMPLANT
YANKAUER SUCT BULB TIP NO VENT (SUCTIONS) ×3 IMPLANT

## 2016-01-09 NOTE — MAU Note (Signed)
[place] (alerted- pt on blood thinners) and house coverage aware of pt arrival.

## 2016-01-09 NOTE — MAU Note (Signed)
Had vag hyst on 11/21.  Has torn some stitches.  Tried to repair in office, unable.   Having real bad pain vaginal. Bleeding continues

## 2016-01-09 NOTE — MAU Provider Note (Signed)
Pt presented to office c/o vaginal bleeding.  No n/v.  +flatus and +nl BMs.  Last BM this morning.  History     CSN: [id number]  Arrival date and time: 01/09/16 1621   First Provider Initiated Contact with Patient 01/09/16 1800      No chief complaint on file.  HPI  OB History    Gravida Para Term Preterm AB Living   5 3       3    SAB TAB Ectopic Multiple Live Births                  Past Medical History:  Diagnosis Date  . Allergy   . Anemia    HEAVY PERIODS  . Headache(784.0)    MIGRAINES  . Multiple sclerosis ([place])   . Neuromuscular disorder ([place])    MS  . Pulmonary embolism ([place]) 2013   lt-?bcp  . Wears glasses     Past Surgical History:  Procedure Laterality Date  . BILATERAL SALPINGECTOMY Bilateral 12/27/2015   Procedure: BILATERAL SALPINGECTOMY;  Surgeon: Everett Graff, MD;  Location: [facility];  Service: Gynecology;  Laterality: Bilateral;  Excision of paratubal cyst  . BREAST LUMPECTOMY WITH RADIOACTIVE SEED LOCALIZATION Left 10/06/2013   Procedure:  RADIOACTIVE SEED LOCALIZATION LEFT BREAST LUMPECTOMY;  Surgeon: Odis Hollingshead, MD;  Location: [facility];  Service: General;  Laterality: Left;  . BREAST REDUCTION SURGERY    . CYSTOSCOPY N/A 12/27/2015   Procedure: CYSTOSCOPY;  Surgeon: Everett Graff, MD;  Location: [facility];  Service: Gynecology;  Laterality: N/A;  . VAGINAL HYSTERECTOMY Bilateral 12/27/2015   Procedure: HYSTERECTOMY VAGINAL;  Surgeon: Everett Graff, MD;  Location: [facility];  Service: Gynecology;  Laterality: Bilateral;  . WISDOM TOOTH EXTRACTION      Family History  Problem Relation Age of Onset  . Asthma Mother   . Hypertension Father   . Cancer Paternal Uncle   . Cancer Paternal Grandfather     Social History  Substance Use Topics  . Smoking status: Never Smoker  . Smokeless tobacco: Never Used  . Alcohol use 0.0 oz/week     Comment: occ    Allergies:  Allergies  Allergen Reactions  . Amoxicillin Hives   Has patient had a PCN reaction causing immediate rash, facial/tongue/throat swelling, SOB or lightheadedness with hypotension: Yes Has patient had a PCN reaction causing severe rash involving mucus membranes or skin necrosis: Yes Has patient had a PCN reaction that required hospitalization No Has patient had a PCN reaction occurring within the last 10 years: No If all of the above answers are "NO", then may proceed with Cephalosporin use.   . Milk-Related Compounds Diarrhea and Nausea And Vomiting    Stomach cramps  . Tinidazole Other (See Comments)    Causes seizures.    Prescriptions Prior to Admission  Medication Sig Dispense Refill Last Dose  . enoxaparin (LOVENOX) 40 MG/0.4ML injection Inject into the skin at bedtime.   01/08/2016 at Unknown time  . Fingolimod HCl (GILENYA) 0.5 MG CAPS Take 1 capsule (0.5 mg total) by mouth daily. 90 capsule 1 01/08/2016 at Unknown time  . HYDROcodone-acetaminophen (NORCO) 7.5-325 MG tablet Take 1 tablet by mouth every 6 (six) hours as needed for moderate pain. 30 tablet 0 Past Week at Unknown time  . levocetirizine (XYZAL) 5 MG tablet Take 5 mg by mouth every evening.   01/08/2016 at Unknown time  . naproxen (NAPRELAN) 500 MG 24 hr tablet Take 1 tablet by mouth 3 (  three) times daily as needed (cramps).    Past Week at Unknown time  . topiramate (TOPAMAX) 100 MG tablet Take 100 mg by mouth at bedtime.    01/08/2016 at Unknown time  . heparin 5000 UNIT/ML injection Inject 1 mL (5,000 Units total) into the skin every 12 (twelve) hours. (Patient not taking: Reported on 01/09/2016) 28 mL 0 Not Taking at Unknown time  . indomethacin (INDOCIN) 25 MG capsule Take 1 capsule (25 mg total) by mouth 2 (two) times daily with a meal. (Patient not taking: Reported on 01/09/2016) 60 capsule 1 Not Taking at Unknown time    ROS  Non-contributory Physical Exam   Blood pressure 137/85, pulse 75, temperature 99.1 F (37.3 C), temperature source Oral, resp. rate 18, height 5'  5" (1.651 m), weight 198 lb 8 oz (90 kg), last menstrual period 12/11/2015, SpO2 100 %.  Physical Exam  Lungs CTA  CV RRR Abd soft, NT Spec in office - no VB, dehiscence in midline of cuff  MAU Course  Procedures    Assessment and Plan  S/p TVH on 11/21 with cuff dehiscence.  Discussed recommendation to return to OR to place stitches as pt too uncomfortable in office.  R/B/A reviewed.  Consent s/w.    Delice Lesch 01/09/2016, 6:05 PM

## 2016-01-09 NOTE — Anesthesia Preprocedure Evaluation (Signed)
Anesthesia Evaluation  Patient identified by MRN, date of birth, ID band Patient awake    Reviewed: Allergy & Precautions, H&P , Patient's Chart, lab work & pertinent test results, reviewed documented beta blocker date and time   Airway Mallampati: II  TM Distance: >3 FB Neck ROM: full    Dental no notable dental hx.    Pulmonary    Pulmonary exam normal breath sounds clear to auscultation       Cardiovascular  Rhythm:regular Rate:Normal     Neuro/Psych    GI/Hepatic   Endo/Other    Renal/GU      Musculoskeletal   Abdominal   Peds  Hematology   Anesthesia Other Findings   Reproductive/Obstetrics                             Anesthesia Physical Anesthesia Plan  ASA: II  Anesthesia Plan:    Post-op Pain Management:    Induction: Intravenous  Airway Management Planned: LMA  Additional Equipment:   Intra-op Plan:   Post-operative Plan:   Informed Consent: I have reviewed the patients History and Physical, chart, labs and discussed the procedure including the risks, benefits and alternatives for the proposed anesthesia with the patient or authorized representative who has indicated his/her understanding and acceptance.   Dental Advisory Given and Dental advisory given  Plan Discussed with: CRNA and Surgeon  Anesthesia Plan Comments: (Discussed GA with LMA, possible sore throat, potential need to switch to ETT, N/V, pulmonary aspiration. Questions answered. )        Anesthesia Quick Evaluation

## 2016-01-09 NOTE — Anesthesia Procedure Notes (Signed)
Procedure Name: LMA Insertion Date/Time: 01/09/2016 6:31 PM Performed by: Barkley Boards L Pre-anesthesia Checklist: Patient identified, Emergency Drugs available, Suction available and Patient being monitored Patient Re-evaluated:Patient Re-evaluated prior to inductionOxygen Delivery Method: Circle system utilized Preoxygenation: Pre-oxygenation with 100% oxygen Intubation Type: IV induction Ventilation: Mask ventilation without difficulty LMA: LMA inserted LMA Size: 4.0 Number of attempts: 1 Placement Confirmation: positive ETCO2 Tube secured with: Tape

## 2016-01-09 NOTE — Transfer of Care (Signed)
Immediate Anesthesia Transfer of Care Note  Patient: Kelly Alvarado  Procedure(s) Performed: Procedure(s): REPAIR VAGINAL CUFF (N/A)  Patient Location: PACU  Anesthesia Type:General  Level of Consciousness: sedated  Airway & Oxygen Therapy: Patient Spontanous Breathing and Patient connected to nasal cannula oxygen  Post-op Assessment: Report given to RN and Post -op Vital signs reviewed and stable  Post vital signs: stable  Last Vitals:  Vitals:   01/09/16 1647 01/09/16 1809  BP: 137/85 147/81  Pulse: 75 74  Resp: 18 17  Temp: 37.3 C 37.4 C    Last Pain:  Vitals:   01/09/16 1809  TempSrc: Oral  PainSc:          Complications: No apparent anesthesia complications

## 2016-01-09 NOTE — Op Note (Signed)
Preop Diagnosis: 1.Vaginal bleeding 2.Vaginal Cuff Dehiscence  Postop Diagnosis: 1.Vaginal bleeding 2.Vaginal cuff dehiscence  Procedure: REPAIR VAGINAL CUFF   Anesthesia: General   Anesthesiologist: Lyndle Herrlich, MD   Attending: Everett Graff, MD   Assistant: N/a  Findings: Midline of cuff dehiscence noted  Pathology: N/a  Fluids: 1000 cc  UOP: 110 cc  EBL: 20 cc  Complications: None  Procedure: The patient was taken to OR 4 after R/B/A discussed in MAU.  The patient verbalized understanding, questions answered and consent signed and witnessed.  The patient was placed under general anesthesia and prepped and draped in the usual sterile fashion.  A weighted speculum was placed in the patient's vagina and a large deaver was placed anteriorly.  The midline of the cuff again revealed dehiscence and the remaining tissue was friable.  The cuff was copiously irrigated with saline and clindamycin douche.  Minimal bleeding was noted but there was a slight ooze (the patient is on prophylactic lovenox secondary to h/o PE and received gentamicin and clindamycin for surgical prophylaxis).  There was no entry into abdomen.  The peritoneum was well healed.  Gelfoam was placed and cuff closed vertically with 0 vicryl interrupted stitches.  Good hemostasis was noted.  The patient tolerated the procedure well and was returned to the recovery room in good condition.

## 2016-01-10 ENCOUNTER — Telehealth: Payer: Self-pay | Admitting: Neurology

## 2016-01-10 ENCOUNTER — Encounter (HOSPITAL_COMMUNITY): Payer: Self-pay | Admitting: Obstetrics and Gynecology

## 2016-01-10 DIAGNOSIS — R569 Unspecified convulsions: Secondary | ICD-10-CM

## 2016-01-10 LAB — RPR: RPR Ser Ql: NONREACTIVE

## 2016-01-10 NOTE — Anesthesia Postprocedure Evaluation (Signed)
Anesthesia Post Note  Patient: Kelly Alvarado  Procedure(s) Performed: Procedure(s) (LRB): REPAIR VAGINAL CUFF (N/A)  Patient location during evaluation: PACU Anesthesia Type: General Level of consciousness: sedated Pain management: satisfactory to patient Vital Signs Assessment: post-procedure vital signs reviewed and stable Respiratory status: spontaneous breathing Cardiovascular status: stable Anesthetic complications: no     Last Vitals:  Vitals:   01/09/16 2045 01/09/16 2114  BP:  116/78  Pulse: 66 67  Resp: 17 18  Temp: 36.7 C 36.7 C    Last Pain:  Vitals:   01/09/16 2115  TempSrc:   PainSc: 5    Pain Goal:                 Riccardo Dubin

## 2016-01-10 NOTE — Telephone Encounter (Signed)
I have spoken with Kelly Alvarado and per RAS, advised that sz. with this med are not common--need an EEG to further investigate.  She is agreeable.  EEG ordered/fim

## 2016-01-10 NOTE — Telephone Encounter (Signed)
Pt called to advise she had a seizure Sunday 01/08/16. Sts she had a hysterectomy 2 weeks ago and was given a medication she has never taken before- tinidazole. OBGYN is not sure if this medication caused the seizure but it could be a possible side effect. OBGYN wanted Dr Felecia Shelling to be aware since pt has never had a seizure before. Can touch base with her if Rn feels it necessary per the pt

## 2016-01-10 NOTE — Telephone Encounter (Signed)
Please let her know that seizures are uncommon with that med so we can't be certain.  We need to check EEG

## 2016-01-17 ENCOUNTER — Other Ambulatory Visit: Payer: Self-pay | Admitting: Neurology

## 2016-02-22 ENCOUNTER — Other Ambulatory Visit: Payer: BC Managed Care – PPO

## 2016-02-27 ENCOUNTER — Ambulatory Visit (INDEPENDENT_AMBULATORY_CARE_PROVIDER_SITE_OTHER): Payer: BC Managed Care – PPO | Admitting: Neurology

## 2016-02-27 DIAGNOSIS — R569 Unspecified convulsions: Secondary | ICD-10-CM

## 2016-02-29 DIAGNOSIS — R569 Unspecified convulsions: Secondary | ICD-10-CM | POA: Insufficient documentation

## 2016-02-29 NOTE — Progress Notes (Signed)
        [facility]  EEG (ELECTROENCEPHALOGRAM) REPORT   STUDY DATE: 02/27/2016 PATIENT NAME: Kelly Alvarado DOB: [birth year] MRN: [id number]  ORDERING CLINICIAN: Edna Rede A. Felecia Shelling, MD. PhD  TECHNOLOGIST: Laretta Alstrom TECHNIQUE: Electroencephalogram was recorded utilizing standard 10-20 system of lead placement and reformatted into average and bipolar montages.  RECORDING TIME: 21 minutes  CLINICAL INFORMATION: 41 year old woman with multiple sclerosis with a seizure.   FINDINGS:   There was a 10 Hz posterior dominant rhythm that reacted to eye opening and closing. There was intermixed symmetrical activity more anteriorly. There were no sharp waves, spikes or other elileptiform activity. Driving response to photic stimulation. Hyperventilation and recovery did not change the underlying rhythms.   She did not enter definitive sleep.  IMPRESSION: This is a normal EEG for the patient was awake and drowsy.   INTERPRETING PHYSICIAN:   Jayci Ellefson A. Felecia Shelling, MD, PhD Certified in Neurology, [place] Neurophysiology, Sleep Medicine, Pain Medicine and Neuroimaging  [facility] [address], [place], [place] [address] [phone]

## 2016-03-01 ENCOUNTER — Telehealth: Payer: Self-pay | Admitting: *Deleted

## 2016-03-01 NOTE — Telephone Encounter (Signed)
LMOM (identified vm) that per RAS, EEG is normal.  She does  not need to return this call unless she has questions/fim

## 2016-03-01 NOTE — Telephone Encounter (Signed)
-----   Message from Britt Bottom, MD sent at 02/29/2016  6:33 PM EST ----- Please let her know that the EEG was normal.

## 2016-04-13 ENCOUNTER — Other Ambulatory Visit: Payer: Self-pay | Admitting: Neurology

## 2016-05-16 ENCOUNTER — Telehealth: Payer: Self-pay | Admitting: Neurology

## 2016-05-16 MED ORDER — METHYLPREDNISOLONE 4 MG PO TBPK
ORAL_TABLET | ORAL | 0 refills | Status: DC
Start: 2016-05-16 — End: 2017-06-21

## 2016-05-16 NOTE — Telephone Encounter (Signed)
Patient called office in reference to requesting a steriod pack to be called in due to low back pain been going on about 2 days.  Pharmacy- [facility]

## 2016-05-16 NOTE — Addendum Note (Signed)
Addended by: France Ravens I on: 05/16/2016 04:50 PM   Modules accepted: Orders

## 2016-05-16 NOTE — Telephone Encounter (Signed)
I have spoken with Kelly Alvarado.  She c/o lbp, nonradiating, onset 2 days ago,  Denies hematuria, dysuria, vag. d/c, other uti sx.  Sts. has been more active, at son's sporting events, believes pain is related to this.  Will check with RAS/fim

## 2016-05-16 NOTE — Telephone Encounter (Signed)
Per RAS, ok for Medrol dose pk.  Rx. escribed to [facility] per pt's request/fim

## 2016-07-09 ENCOUNTER — Ambulatory Visit: Payer: BC Managed Care – PPO | Admitting: Neurology

## 2016-07-12 ENCOUNTER — Other Ambulatory Visit: Payer: Self-pay | Admitting: Neurology

## 2016-08-15 ENCOUNTER — Other Ambulatory Visit: Payer: Self-pay | Admitting: Neurology

## 2016-12-09 ENCOUNTER — Other Ambulatory Visit: Payer: Self-pay | Admitting: Neurology

## 2017-02-19 ENCOUNTER — Ambulatory Visit: Payer: BC Managed Care – PPO | Admitting: Neurology

## 2017-02-19 ENCOUNTER — Other Ambulatory Visit: Payer: Self-pay

## 2017-02-19 ENCOUNTER — Encounter: Payer: Self-pay | Admitting: Neurology

## 2017-02-19 VITALS — BP 134/89 | HR 81 | Resp 16 | Ht 65.0 in | Wt 191.0 lb

## 2017-02-19 DIAGNOSIS — Z79899 Other long term (current) drug therapy: Secondary | ICD-10-CM | POA: Diagnosis not present

## 2017-02-19 DIAGNOSIS — R208 Other disturbances of skin sensation: Secondary | ICD-10-CM

## 2017-02-19 DIAGNOSIS — R569 Unspecified convulsions: Secondary | ICD-10-CM | POA: Diagnosis not present

## 2017-02-19 DIAGNOSIS — G4700 Insomnia, unspecified: Secondary | ICD-10-CM

## 2017-02-19 DIAGNOSIS — G35 Multiple sclerosis: Secondary | ICD-10-CM

## 2017-02-19 DIAGNOSIS — G43709 Chronic migraine without aura, not intractable, without status migrainosus: Secondary | ICD-10-CM

## 2017-02-19 DIAGNOSIS — IMO0002 Reserved for concepts with insufficient information to code with codable children: Secondary | ICD-10-CM

## 2017-02-19 MED ORDER — GABAPENTIN 300 MG PO CAPS: ORAL_CAPSULE | ORAL | 11 refills | Status: DC

## 2017-02-19 NOTE — Progress Notes (Signed)
[facility]  PATIENT: Kelly Alvarado DOB: 1975-06-20  REFERRING DOCTOR OR PCP:  [name]  SOURCE: patient and records  _________________________________   HISTORICAL  CHIEF COMPLAINT:  Chief Complaint  Patient presents with  . Multiple Sclerosis    Sts. she continues to tolerate Gilenya well.  Sts. she is having worsening burning pain starting in right foot radiating up leg, to right torso. Episodes are brief (minutes), and sometimes triggered by changing position.  No relief with ice/heat, compression socks. Sts. migraines are less frequent, same severity, and still lasting about 2 days each. Has not had any more episodes of sz. like activity/fim  . MIgraines    HISTORY OF PRESENT ILLNESS:  Kelly Alvarado is a 42 year old woman with MS and h/o a seizure in 2017.  Update 02/20/2016: She is on Gilenya and has not had any definite exacerbations.   He tolerates it well.   No new weakness r gait change.   However, she has had more painful burning dysesthesia in her leg up to the chest, mostly on her right side.    Initially, this only happened at night but now is happening day and night.   Sometimes she feels weaker afterwards and she may have mild aching pain in between the pain.   These episodes last a minute or two at a time and occur several t times a week but sometimes up to 5 times a day.   Compression stockings and ice have not helped.  In the past, she had been on gabapentin for headache but not for this symptoms.   It had not help ed her other symptom.  She thinks she tolerated it well.       Recent labwork shows lymphocyte count = 0.2.   LFTs were normal.  Migraines are occurring about once a week for 36-48 hours.   Triptans have not helped.    She usually takes several Tylenols but that usually does not help.    Naproxen sometimes helps more but upsets her stomach.     She notes urinary frequency but no incontinence.     She has not had any more seizures.      From 01/03/2016: MS:   She feels stable and denies any new symptoms since the last visit.   She is taking Gilenya daily and tolerates it well. She has not had any definite exacerbations while she has taken the medication.    Dysesthesia:   She is having electric shock like sensations all over but worse in right arm and legs.    She feels these dysesthesias are slightly better since starting Topamax and she tolerates it well.    She denies Lhermite's signs.  Shocks are random and frequent.     She had more a hysterectomy last week and had more dysesthesia afterwards, like the right leg was on fire.             Gait/strength: She feels her gait returned to normal after initial exacerbation. Strength  is normal in the limbs.  Vision: She has mild reduced color saturation out of the right eye and notes mild blurriness at times. No diplopia.    Bladder:   She has occasional urinary frequency or hesitancy. No incontinence.   No hesitancy  Fatigue/sleep: She notes fatigue that gets worse as the day goes on. This is both physical and cognitive. She has gained some weight even though she is trying to stay active. Insomnia is a little  worse (both sleep onset and maintenance).        Mood/cognition:   She denies any depression or anxiety. No major cognitive dysfunction but does have a little bit of issues at times. Mostly she finds that she has decreased focus and attention. In the past, she was diagnosed with ADD. Vyvanse was not well tolerated.    Migraines: She is having migraines about 7-8 days a month. Typically migraine lasts 2 days. There occur randomly or associated with her cycle.   When present, the pain is usually over the left eye and is constant, more than throbbing. When severe it is bilateral.   She will sometimes have a mild visual aura before the migraine. When present, she will have photophobia and phonophobia. She will get nausea and occasionally will get vomiting. Moving will make the  headaches worse.  Sometimes being touch in the head will trigger a migraine. Topamax at low dose did not help much.   A higher dose is being tried d much.    Triptan such as Imitrex and Maxalt have not really helped reduce the headache pain. Fioricet had not helped in the past. Midrin and Hydrocodone or indomethacin often help some.       MS History:   She was diagnosed with multiple sclerosis in 2010 after presenting with a left foot drop. MRI of the brain was consistent with multiple sclerosis.   She was  started on Rebif. The MS was well controlled but she felt poorly on that medication. She transferred to [facility] and began to see me in 2012. She was placed on Tysabri for about a year and felt much better while on it. Her MS did well on Tysabri as well.  Unfortunately, she was JCV positive and after about a year she stopped Tysabri and started Gilenya. She has done fairly well on Gilenya with no definite exacerbations.      REVIEW OF SYSTEMS: Constitutional: No fevers, chills, sweats, or change in appetite.  She notes fatigue and insomnia. Eyes: No visual changes, double vision, eye pain Ear, nose and throat: No hearing loss, ear pain, nasal congestion, sore throat Cardiovascular: No chest pain, palpitations Respiratory: No shortness of breath at rest or with exertion.   No wheezes GastrointestinaI: No nausea, vomiting, diarrhea, abdominal pain, fecal incontinence Genitourinary: No dysuria, urinary retention or frequency.  No nocturia. Musculoskeletal: No neck pain, back .  Notes myalgias at times Integumentary: No rash, pruritus, skin lesions Neurological: as above Psychiatric: No depression at this time.  No anxiety Endocrine: No palpitations, diaphoresis, change in appetite, change in weigh or increased thirst Hematologic/Lymphatic: No anemia, purpura, petechiae. Allergic/Immunologic: No itchy/runny eyes, nasal congestion, recent allergic reactions, rashes.  She has some  food allergies.  ALLERGIES: Allergies  Allergen Reactions  . Amoxicillin Hives    Has patient had a PCN reaction causing immediate rash, facial/tongue/throat swelling, SOB or lightheadedness with hypotension: Yes Has patient had a PCN reaction causing severe rash involving mucus membranes or skin necrosis: Yes Has patient had a PCN reaction that required hospitalization No Has patient had a PCN reaction occurring within the last 10 years: No If all of the above answers are "NO", then may proceed with Cephalosporin use.   . Milk-Related Compounds Diarrhea and Nausea And Vomiting    Stomach cramps  . Tinidazole Other (See Comments)    Causes seizures.    HOME MEDICATIONS:  Current Outpatient Medications:  Marland Kitchen  GILENYA 0.5 MG CAPS, TAKE ONE CAPSULE (0.5 MG)  BY MOUTH ONCE DAILY. MAY TAKE WITH OR WITHOUT FOOD. STORE AT ROOM TEMPERATURE., Disp: 90 capsule, Rfl: 3 .  levocetirizine (XYZAL) 5 MG tablet, Take 5 mg by mouth every evening., Disp: , Rfl:  .  topiramate (TOPAMAX) 100 MG tablet, TAKE 1 TABLET (100 MG TOTAL) BY MOUTH DAILY., Disp: 90 tablet, Rfl: 1 .  gabapentin (NEURONTIN) 300 MG capsule, Take one po qAM, one po qPM and 2 po qHS, Disp: 120 capsule, Rfl: 11 .  HYDROcodone-acetaminophen (NORCO) 7.5-325 MG tablet, Take 1 tablet by mouth every 6 (six) hours as needed for moderate pain. (Patient not taking: Reported on 02/19/2017), Disp: 30 tablet, Rfl: 0 .  methylPREDNISolone (MEDROL DOSEPAK) 4 MG TBPK tablet, Take 6 tablets on day 1, 5 on day 2, 4 on day 3, 3 on day 4, 2 on day 5, and 1 on day 6., Disp: 21 tablet, Rfl: 0 .  naproxen (NAPRELAN) 500 MG 24 hr tablet, Take 1 tablet by mouth 3 (three) times daily as needed (cramps). , Disp: , Rfl:   PAST MEDICAL HISTORY: Past Medical History:  Diagnosis Date  . Allergy   . Anemia    HEAVY PERIODS  . Headache(784.0)    MIGRAINES  . Multiple sclerosis ([place])   . Neuromuscular disorder ([place])    MS  . Pulmonary embolism ([place]) 2013    lt-?bcp  . Wears glasses     PAST SURGICAL HISTORY: Past Surgical History:  Procedure Laterality Date  . BILATERAL SALPINGECTOMY Bilateral 12/27/2015   Procedure: BILATERAL SALPINGECTOMY;  Surgeon: Everett Graff, MD;  Location: [facility];  Service: Gynecology;  Laterality: Bilateral;  Excision of paratubal cyst  . BREAST LUMPECTOMY WITH RADIOACTIVE SEED LOCALIZATION Left 10/06/2013   Procedure:  RADIOACTIVE SEED LOCALIZATION LEFT BREAST LUMPECTOMY;  Surgeon: Odis Hollingshead, MD;  Location: [facility];  Service: General;  Laterality: Left;  . BREAST REDUCTION SURGERY    . CYSTOSCOPY N/A 12/27/2015   Procedure: CYSTOSCOPY;  Surgeon: Everett Graff, MD;  Location: [facility];  Service: Gynecology;  Laterality: N/A;  . REPAIR VAGINAL CUFF N/A 01/09/2016   Procedure: REPAIR VAGINAL CUFF;  Surgeon: Everett Graff, MD;  Location: [facility];  Service: Gynecology;  Laterality: N/A;  . VAGINAL HYSTERECTOMY Bilateral 12/27/2015   Procedure: HYSTERECTOMY VAGINAL;  Surgeon: Everett Graff, MD;  Location: [facility];  Service: Gynecology;  Laterality: Bilateral;  . WISDOM TOOTH EXTRACTION      FAMILY HISTORY: Family History  Problem Relation Age of Onset  . Asthma Mother   . Hypertension Father   . Cancer Paternal Uncle   . Cancer Paternal Grandfather     SOCIAL HISTORY:  Social History   Socioeconomic History  . Marital status: Married    Spouse name: Not on file  . Number of children: Not on file  . Years of education: Not on file  . Highest education level: Not on file  Social Needs  . Financial resource strain: Not on file  . Food insecurity - worry: Not on file  . Food insecurity - inability: Not on file  . Transportation needs - medical: Not on file  . Transportation needs - non-medical: Not on file  Occupational History  . Not on file  Tobacco Use  . Smoking status: Never Smoker  . Smokeless tobacco: Never Used  Substance and Sexual Activity  . Alcohol use: Yes     Alcohol/week: 0.0 oz    Comment: occ  . Drug use: No  . Sexual activity: Yes  Birth control/protection: None    Comment: VASECTOMY  Other Topics Concern  . Not on file  Social History Narrative  . Not on file     PHYSICAL EXAM  Vitals:   02/19/17 1539  BP: 134/89  Pulse: 81  Resp: 16  Weight: 191 lb (86.6 kg)  Height: 5\' 5"  (1.651 m)    Body mass index is 31.78 kg/m.   General: The patient is well-developed and well-nourished and in no acute distress   Musculoskeletal:  Neck is non-tender.    Neurologic Exam  Mental status: The patient is alert and oriented x 3 at the time of the examination. The patient has apparent normal recent and remote memory, with an apparently normal attention span and concentration ability.   Speech is normal.  Cranial nerves: Extraocular movements are full.   There is a mild reduction in color vision out of the right eye. Facial strength and sensation is normal. Trapezius strength is normal. The tongue is midline, and the patient has symmetric elevation of the soft palate. No obvious hearing deficits are noted.  Motor:  Muscle bulk is normal.   Tone is normal. Strength is  5 / 5 in all 4 extremities.   Sensory: He has intact sensation to touch and vibration in the arms and legs.  Coordination: Cerebellar testing reveals good finger-nose-finger and heel-to-shin bilaterally.  Gait and station: Station is normal.   Gait is normal. The tandem gait is mildly wide. Romberg is negative.   Reflexes: Deep tendon reflexes are symmetric and normal bilaterally.        DIAGNOSTIC DATA (LABS, IMAGING, TESTING) - I reviewed patient records, labs, notes, testing and imaging myself where available.  Lab Results  Component Value Date   WBC 2.9 (L) 01/09/2016   HGB 14.1 01/09/2016   HCT 41.1 01/09/2016   MCV 88.4 01/09/2016   PLT 315 01/09/2016        ASSESSMENT AND PLAN  High risk medication use  Multiple sclerosis ([place]) - Plan: MR  BRAIN W WO CONTRAST, MR CERVICAL SPINE W WO CONTRAST  Chronic migraine  Dysesthesia  Convulsions, unspecified convulsion type ([place])  Insomnia, unspecified type   1.   Continue Gilenya. She had recent blood work at the hospital.. 2.   MRI of the brain and cervical spine to determine if there has been any subclinical progression while on Gilenya. Additionally this will allow [place] to evaluate her new dysesthesias.. 3.   Stay active and exercises.  4.   Adding gabapentin for the dysesthesias. 5.   She will return in 6 months or sooner if there are new or worsening neurologic symptoms.   Jasenia Weilbacher A. Felecia Shelling, MD, PhD 1/96/2229, 7:98 PM Certified in Neurology, Clinical Neurophysiology, Sleep Medicine, Pain Medicine and Neuroimaging  [facility] [address], [place], [place] [address] [phone]

## 2017-06-03 ENCOUNTER — Other Ambulatory Visit: Payer: Self-pay | Admitting: Neurology

## 2017-06-21 ENCOUNTER — Ambulatory Visit (HOSPITAL_COMMUNITY)
Admission: EM | Admit: 2017-06-21 | Discharge: 2017-06-21 | Disposition: A | Discharge status: Home / Self Care | Payer: BC Managed Care – PPO | Attending: Family Medicine | Admitting: Family Medicine

## 2017-06-21 ENCOUNTER — Other Ambulatory Visit: Payer: Self-pay

## 2017-06-21 ENCOUNTER — Encounter (HOSPITAL_COMMUNITY): Payer: Self-pay | Admitting: Emergency Medicine

## 2017-06-21 DIAGNOSIS — R1013 Epigastric pain: Secondary | ICD-10-CM | POA: Diagnosis not present

## 2017-06-21 DIAGNOSIS — Z1629 Resistance to other single specified antibiotic: Secondary | ICD-10-CM | POA: Insufficient documentation

## 2017-06-21 DIAGNOSIS — B9620 Unspecified Escherichia coli [E. coli] as the cause of diseases classified elsewhere: Secondary | ICD-10-CM | POA: Diagnosis not present

## 2017-06-21 DIAGNOSIS — M546 Pain in thoracic spine: Secondary | ICD-10-CM

## 2017-06-21 DIAGNOSIS — Z1611 Resistance to penicillins: Secondary | ICD-10-CM | POA: Insufficient documentation

## 2017-06-21 LAB — COMPREHENSIVE METABOLIC PANEL
ALBUMIN: 4.3 g/dL (ref 3.5–5.0)
ALT: 52 U/L (ref 14–54)
ANION GAP: 8 (ref 5–15)
AST: 31 U/L (ref 15–41)
Alkaline Phosphatase: 98 U/L (ref 38–126)
BILIRUBIN TOTAL: 0.8 mg/dL (ref 0.3–1.2)
BUN: 7 mg/dL (ref 6–20)
CALCIUM: 9.9 mg/dL (ref 8.9–10.3)
CO2: 23 mmol/L (ref 22–32)
CREATININE: 0.95 mg/dL (ref 0.44–1.00)
Chloride: 111 mmol/L (ref 101–111)
GFR calc non Af Amer: >60 mL/min (ref >60)
GLUCOSE: 85 mg/dL (ref 65–99)
Potassium: 3.9 mmol/L (ref 3.5–5.1)
Sodium: 142 mmol/L (ref 135–145)
TOTAL PROTEIN: 7.2 g/dL (ref 6.5–8.1)

## 2017-06-21 LAB — CBC
HEMATOCRIT: 38.2 % (ref 36.0–46.0)
HEMOGLOBIN: 13 g/dL (ref 12.0–15.0)
MCH: 31.6 pg (ref 26.0–34.0)
MCHC: 34 g/dL (ref 30.0–36.0)
MCV: 92.7 fL (ref 78.0–100.0)
Platelets: 266 10*3/uL (ref 150–400)
RBC: 4.12 MIL/uL (ref 3.87–5.11)
RDW: 12.8 % (ref 11.5–15.5)
WBC: 3.3 10*3/uL — ABNORMAL LOW (ref 4.0–10.5)

## 2017-06-21 LAB — POCT URINALYSIS DIP (DEVICE)
Bilirubin Urine: NEGATIVE
Glucose, UA: 100 mg/dL — AB
Ketones, ur: NEGATIVE mg/dL
Nitrite: POSITIVE — AB
PH: 5.5 (ref 5.0–8.0)
PROTEIN: NEGATIVE mg/dL
SPECIFIC GRAVITY, URINE: 1.025 (ref 1.005–1.030)
Urobilinogen, UA: 0.2 mg/dL (ref 0.0–1.0)

## 2017-06-21 LAB — LIPASE, BLOOD: Lipase: 31 U/L (ref 11–51)

## 2017-06-21 MED ORDER — NITROFURANTOIN MONOHYD MACRO 100 MG PO CAPS: 100.0000 mg | ORAL_CAPSULE | Freq: Two times a day (BID) | ORAL | 0 refills | Status: AC

## 2017-06-21 MED ORDER — RANITIDINE HCL 150 MG PO TABS: 150.0000 mg | ORAL_TABLET | Freq: Two times a day (BID) | ORAL | 0 refills | Status: DC

## 2017-06-21 MED ORDER — FLUCONAZOLE 150 MG PO TABS: 150.0000 mg | ORAL_TABLET | Freq: Once | ORAL | 1 refills | Status: AC

## 2017-06-21 NOTE — ED Triage Notes (Signed)
The patient presented to the [facility] with a complaint of abdominal and back pain x 3 days that she believed to possibly be her gall bladder.

## 2017-06-21 NOTE — ED Provider Notes (Signed)
[facility]    CSN: [id number] Arrival date & time: 06/21/17  1110     History   Chief Complaint Chief Complaint  Patient presents with  . Abdominal Pain    HPI Kelly Alvarado is a 42 y.o. female history of MS presenting today with abdominal pain back pain and chest pain.  Symptoms have been going on for approximately 3 days.  Pain will shift into where she is having the pain between her abdomen, back and chest.  She does not have any persistent pain.  She states that one year ago she was being evaluated for MS and was noted to have a "angry gallbladder".  She has never had issues since, but is wondering if her pain is related to her gallbladder.  States that her abdominal pain is mainly epigastric.  Denies any nausea or vomiting, has had some diarrhea, but nothing persistent.  Pain is preventing her from sleeping.  It is solid yesterday without worsening the pain, cannot correlate worsening with eating.  Denies dysuria, but does note increased frequency.  HPI  Past Medical History:  Diagnosis Date  . Allergy   . Anemia    HEAVY PERIODS  . Headache(784.0)    MIGRAINES  . Multiple sclerosis ([place])   . Neuromuscular disorder ([place])    MS  . Pulmonary embolism ([place]) 2013   lt-?bcp  . Wears glasses     Patient Active Problem List   Diagnosis Date Noted  . Convulsions ([place]) 02/29/2016  . Menorrhagia 12/27/2015  . Chronic migraine 09/02/2015  . Dysesthesia 09/02/2015  . Obesity 09/02/2015  . Common migraine without intractability 05/06/2014  . High risk medication use 05/06/2014  . Visual disturbance 05/06/2014  . Other fatigue 05/06/2014  . Insomnia 05/06/2014  . Myalgia and myositis 05/06/2014  . 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 Pulmonary embolism ([place]) 07/17/2011  . Multiple sclerosis ([place]) 07/17/2011    Past Surgical History:  Procedure Laterality Date  . BILATERAL SALPINGECTOMY Bilateral 12/27/2015   Procedure: BILATERAL SALPINGECTOMY;   Surgeon: Everett Graff, MD;  Location: [facility];  Service: Gynecology;  Laterality: Bilateral;  Excision of paratubal cyst  . BREAST LUMPECTOMY WITH RADIOACTIVE SEED LOCALIZATION Left 10/06/2013   Procedure:  RADIOACTIVE SEED LOCALIZATION LEFT BREAST LUMPECTOMY;  Surgeon: Odis Hollingshead, MD;  Location: [facility];  Service: General;  Laterality: Left;  . BREAST REDUCTION SURGERY    . CYSTOSCOPY N/A 12/27/2015   Procedure: CYSTOSCOPY;  Surgeon: Everett Graff, MD;  Location: [facility];  Service: Gynecology;  Laterality: N/A;  . REPAIR VAGINAL CUFF N/A 01/09/2016   Procedure: REPAIR VAGINAL CUFF;  Surgeon: Everett Graff, MD;  Location: [facility];  Service: Gynecology;  Laterality: N/A;  . VAGINAL HYSTERECTOMY Bilateral 12/27/2015   Procedure: HYSTERECTOMY VAGINAL;  Surgeon: Everett Graff, MD;  Location: [facility];  Service: Gynecology;  Laterality: Bilateral;  . WISDOM TOOTH EXTRACTION      OB History    Gravida  5   Para  3   Term      Preterm      AB      Living  3     SAB      TAB      Ectopic      Multiple      Live Births               Home Medications    Prior to Admission medications   Medication Sig Start Date End Date  Taking? Authorizing Provider  GILENYA 0.5 MG CAPS TAKE ONE CAPSULE (0.5 MG) BY MOUTH ONCE DAILY. MAY TAKE WITH OR WITHOUT FOOD. STORE AT ROOM TEMPERATURE. 07/12/16  Yes Sater, Nanine Means, MD  levocetirizine (XYZAL) 5 MG tablet Take 5 mg by mouth every evening.   Yes [provider]  topiramate (TOPAMAX) 100 MG tablet TAKE 1 TABLET BY MOUTH EVERY DAY 06/03/17  Yes Sater, Nanine Means, MD  fluconazole (DIFLUCAN) 150 MG tablet Take 1 tablet (150 mg total) by mouth once for 1 dose. 06/21/17 06/21/17  Wieters, Hallie C, PA-C  nitrofurantoin, macrocrystal-monohydrate, (MACROBID) 100 MG capsule Take 1 capsule (100 mg total) by mouth 2 (two) times daily for 5 days. 06/21/17 06/26/17  Wieters, Hallie C, PA-C  ranitidine (ZANTAC) 150 MG tablet Take  1 tablet (150 mg total) by mouth 2 (two) times daily for 14 days. 06/21/17 07/05/17  Wieters, Elesa Hacker, PA-C    Family History Family History  Problem Relation Age of Onset  . Asthma Mother   . Hypertension Father   . Cancer Paternal Uncle   . Cancer Paternal Grandfather     Social History Social History   Tobacco Use  . Smoking status: Never Smoker  . Smokeless tobacco: Never Used  Substance Use Topics  . Alcohol use: Yes    Alcohol/week: 0.0 oz    Comment: occ  . Drug use: No     Allergies   Amoxicillin; Milk-related compounds; and Tinidazole   Review of Systems Review of Systems  Constitutional: Negative for activity change, appetite change and fever.  HENT: Negative for trouble swallowing.   Eyes: Negative for pain and visual disturbance.  Respiratory: Negative for shortness of breath.   Cardiovascular: Positive for chest pain.  Gastrointestinal: Positive for abdominal pain and diarrhea. Negative for nausea and vomiting.  Genitourinary: Positive for frequency. Negative for dysuria, flank pain, genital sores, hematuria, menstrual problem, vaginal bleeding, vaginal discharge and vaginal pain.  Musculoskeletal: Positive for back pain. Negative for arthralgias, gait problem, myalgias, neck pain and neck stiffness.  Skin: Negative for color change, rash and wound.  Neurological: Negative for dizziness, seizures, syncope, weakness, light-headedness, numbness and headaches.     Physical Exam Triage Vital Signs ED Triage Vitals  Enc Vitals Group     BP 06/21/17 1219 131/90     Pulse Rate 06/21/17 1219 68     Resp 06/21/17 1219 18     Temp 06/21/17 1219 98.5 F (36.9 C)     Temp Source 06/21/17 1219 Oral     SpO2 06/21/17 1219 100 %     Weight --      Height --      Head Circumference --      Peak Flow --      Pain Score 06/21/17 1217 2     Pain Loc --      Pain Edu? --      Excl. in [place]? --    No data found.  Updated Vital Signs BP 131/90 (BP Location:  Left Arm)   Pulse 68   Temp 98.5 F (36.9 C) (Oral)   Resp 18   LMP 12/11/2015 (Approximate)   SpO2 100%   Visual Acuity Right Eye Distance:   Left Eye Distance:   Bilateral Distance:    Right Eye Near:   Left Eye Near:    Bilateral Near:     Physical Exam  Constitutional: She appears well-developed and well-nourished. No distress.  HENT:  Head: Normocephalic and atraumatic.  Eyes: Conjunctivae are normal.  Neck: Neck supple.  Cardiovascular: Normal rate and regular rhythm.  No murmur heard. Pulmonary/Chest: Effort normal and breath sounds normal. No respiratory distress.  Breathing comfortably at rest, CTABL, no wheezing, rales or other adventitious sounds auscultated  Chest without reproducible pain  Abdominal: Soft. There is no tenderness.  Mild tenderness to palpation of epigastric area, negative rebound, negative Rovsing, negative McBurney, negative Murphy's.  Musculoskeletal: She exhibits no edema.  Neurological: She is alert.  Skin: Skin is warm and dry.  Psychiatric: She has a normal mood and affect.  Nursing note and vitals reviewed.    UC Treatments / Results  Labs (all labs ordered are listed, but only abnormal results are displayed) Labs Reviewed  CBC - Abnormal; Notable for the following components:      Result Value   WBC 3.3 (*)    All other components within normal limits  POCT URINALYSIS DIP (DEVICE) - Abnormal; Notable for the following components:   Glucose, UA 100 (*)    Hgb urine dipstick TRACE (*)    Nitrite POSITIVE (*)    Leukocytes, UA SMALL (*)    All other components within normal limits  URINE CULTURE  COMPREHENSIVE METABOLIC PANEL  LIPASE, BLOOD    EKG None  Radiology No results found.  Procedures Procedures (including critical care time)  Medications Ordered in UC Medications - No data to display  Initial Impression / Assessment and Plan / UC Course  I have reviewed the triage vital signs and the nursing  notes.  Pertinent labs & imaging results that were available during my care of the patient were reviewed by me and considered in my medical decision making (see chart for details).     EKG normal sinus rhythm, nonspecific T wave abnormalities, comparable to EKG from 2017.  UA showing positive nitrites and small leuks, will go ahead and treat for UTI with Macrobid.  Epigastric discomfort does not seem to correlate with this, will obtain CBC/CMP/lipase.  We will do a trial of Zantac for possible reflux related pain.  Follow-up with PCP for outpatient ultrasound of gallbladder.  Discussed strict return precautions. Patient verbalized understanding and is agreeable with plan.  Final Clinical Impressions(s) / UC Diagnoses   Final diagnoses:  Epigastric pain  Acute bilateral thoracic back pain     Discharge Instructions     EKG looked normal, no signs of heart attack  Urine showed sings of infection- please begin macrobid twice daily for 5 days  Blood work will check for infection, liver/gallbladder, and pancreas  In the mean time begin zantac twice daily for 2 weeks as a trial.  Please make an appointment to follow up with your PCP for outpatient ultrasound  If abdominal pain worsening, developing vomiting, worsening chest pain, shortness of breath or fever please go to emergency room for further evaluation.   ED Prescriptions    Medication Sig Dispense Auth. Provider   ranitidine (ZANTAC) 150 MG tablet Take 1 tablet (150 mg total) by mouth 2 (two) times daily for 14 days. 28 tablet Wieters, Hallie C, PA-C   nitrofurantoin, macrocrystal-monohydrate, (MACROBID) 100 MG capsule Take 1 capsule (100 mg total) by mouth 2 (two) times daily for 5 days. 10 capsule Wieters, Hallie C, PA-C   fluconazole (DIFLUCAN) 150 MG tablet Take 1 tablet (150 mg total) by mouth once for 1 dose. 2 tablet Wieters, Hallie C, PA-C     Controlled Substance Prescriptions [place] Controlled Substance Registry  consulted? Not Applicable  Joneen Caraway Claiborne C, PA-C 06/21/17 2245

## 2017-06-21 NOTE — Discharge Instructions (Addendum)
EKG looked normal, no signs of heart attack  Urine showed sings of infection- please begin macrobid twice daily for 5 days  Blood work will check for infection, liver/gallbladder, and pancreas  In the mean time begin zantac twice daily for 2 weeks as a trial.  Please make an appointment to follow up with your PCP for outpatient ultrasound  If abdominal pain worsening, developing vomiting, worsening chest pain, shortness of breath or fever please go to emergency room for further evaluation.

## 2017-06-24 ENCOUNTER — Telehealth (HOSPITAL_COMMUNITY): Payer: Self-pay

## 2017-06-24 LAB — URINE CULTURE: Culture: >=100000 — AB

## 2017-06-24 NOTE — Telephone Encounter (Signed)
Urine culture positive for E. Coli, this was treated with Macrobid at urgent care visit. Patient also had slightly abnormal lab values, sent to ordering physician for further instructions for patient.

## 2017-07-04 ENCOUNTER — Other Ambulatory Visit: Payer: Self-pay | Admitting: Neurology

## 2017-08-14 ENCOUNTER — Telehealth: Payer: Self-pay | Admitting: *Deleted

## 2017-08-14 NOTE — Telephone Encounter (Signed)
[facility] (mobile #) She has an appt. with RAS on 08/23/17 and he will be out of the office that day due to a work related [place].  I need to r/s her appt.  I tried to reach her at her home # as well but received message (memory is full; enter the remote access code."/fim

## 2017-08-19 NOTE — Telephone Encounter (Signed)
Pt returned RN's call. Appt scheduled for 7/24.  FYI

## 2017-08-19 NOTE — Telephone Encounter (Signed)
Noted/fim 

## 2017-08-23 ENCOUNTER — Ambulatory Visit: Payer: BC Managed Care – PPO | Admitting: Neurology

## 2017-08-28 ENCOUNTER — Encounter: Payer: Self-pay | Admitting: Neurology

## 2017-08-28 ENCOUNTER — Ambulatory Visit: Payer: BC Managed Care – PPO | Admitting: Neurology

## 2017-08-28 VITALS — BP 126/86 | HR 71 | Resp 16 | Ht 65.0 in | Wt 176.5 lb

## 2017-08-28 DIAGNOSIS — Z79899 Other long term (current) drug therapy: Secondary | ICD-10-CM

## 2017-08-28 DIAGNOSIS — G43009 Migraine without aura, not intractable, without status migrainosus: Secondary | ICD-10-CM

## 2017-08-28 DIAGNOSIS — R5383 Other fatigue: Secondary | ICD-10-CM | POA: Diagnosis not present

## 2017-08-28 DIAGNOSIS — G35 Multiple sclerosis: Secondary | ICD-10-CM | POA: Diagnosis not present

## 2017-08-28 DIAGNOSIS — R208 Other disturbances of skin sensation: Secondary | ICD-10-CM

## 2017-08-28 DIAGNOSIS — G4700 Insomnia, unspecified: Secondary | ICD-10-CM | POA: Diagnosis not present

## 2017-08-28 MED ORDER — CYCLOBENZAPRINE HCL 5 MG PO TABS: 5.0000 mg | ORAL_TABLET | Freq: Three times a day (TID) | ORAL | 0 refills | Status: DC | PRN

## 2017-08-28 NOTE — Progress Notes (Signed)
[facility]  PATIENT: Kelly Alvarado DOB: 1975-03-15  REFERRING DOCTOR OR PCP:  [name]  SOURCE: patient and records  _________________________________   HISTORICAL  CHIEF COMPLAINT:  Chief Complaint  Patient presents with  . Multiple Sclerosis    Sts. she continues to tolerate Gilenya well.  Is having more trouble with fatigue, generalized weakness, neck stiffness.  Believes this is related to the heat.  Their air conditioning broke--they were without air for 2 wks--but it's been repaired now/fim    HISTORY OF PRESENT ILLNESS:  Kelly Alvarado is a 42 year old woman with MS and h/o a seizure in 2017.  Update 08/28/2017: She feels her MS is mostly stable.  She is on Gilenya and she tolerates it well.  Although she has no definite exacerbations, she is not feeling as good over the last 2 weeks (AC was broken and temps were elevated).    It is fixed now but she only feels slightly better.    She remains fatigued and feels achy .   The soreness is mostly in the limbs and neck and around her torso.   Her neck is stiff.       She has had more trouble with insomnia, both sleep onset and sleep maintenance.   She used to sleep much better.     She has achiness in both hands. She takes gabapentin 600 mg at night and was sleeping well until this recent episode.   It also helped her dysesthesias until the last week.     She denies change in bladder function with some frequency.   She notes reduced vision but has not seen ophthalmology recently.       No recent seizures.    She feels migraines are about the same about 2 days a week.     She is still on topiramate and takes naprosyn when she has one.    At the beginning of the visit, she had no headache but noted one after the examination.     She denies any fevers.     Update 02/20/2016: She is on Gilenya and has not had any definite exacerbations.   He tolerates it well.   No new weakness r gait change.   However, she has  had more painful burning dysesthesia in her leg up to the chest, mostly on her right side.    Initially, this only happened at night but now is happening day and night.   Sometimes she feels weaker afterwards and she may have mild aching pain in between the pain.   These episodes last a minute or two at a time and occur several t times a week but sometimes up to 5 times a day.   Compression stockings and ice have not helped.  In the past, she had been on gabapentin for headache but not for this symptoms.   It had not help ed her other symptom.  She thinks she tolerated it well.       Recent labwork shows lymphocyte count = 0.2.   LFTs were normal.  Migraines are occurring about once a week for 36-48 hours.   Triptans have not helped.    She usually takes several Tylenols but that usually does not help.    Naproxen sometimes helps more but upsets her stomach.     She notes urinary frequency but no incontinence.     She has not had any more seizures.     From 01/03/2016: MS:  She feels stable and denies any new symptoms since the last visit.   She is taking Gilenya daily and tolerates it well. She has not had any definite exacerbations while she has taken the medication.    Dysesthesia:   She is having electric shock like sensations all over but worse in right arm and legs.    She feels these dysesthesias are slightly better since starting Topamax and she tolerates it well.    She denies Lhermite's signs.  Shocks are random and frequent.     She had more a hysterectomy last week and had more dysesthesia afterwards, like the right leg was on fire.             Gait/strength: She feels her gait returned to normal after initial exacerbation. Strength  is normal in the limbs.  Vision: She has mild reduced color saturation out of the right eye and notes mild blurriness at times. No diplopia.    Bladder:   She has occasional urinary frequency or hesitancy. No incontinence.   No hesitancy  Fatigue/sleep:  She notes fatigue that gets worse as the day goes on. This is both physical and cognitive. She has gained some weight even though she is trying to stay active. Insomnia is a little worse (both sleep onset and maintenance).        Mood/cognition:   She denies any depression or anxiety. No major cognitive dysfunction but does have a little bit of issues at times. Mostly she finds that she has decreased focus and attention. In the past, she was diagnosed with ADD. Vyvanse was not well tolerated.    Migraines: She is having migraines about 7-8 days a month. Typically migraine lasts 2 days. There occur randomly or associated with her cycle.   When present, the pain is usually over the left eye and is constant, more than throbbing. When severe it is bilateral.   She will sometimes have a mild visual aura before the migraine. When present, she will have photophobia and phonophobia. She will get nausea and occasionally will get vomiting. Moving will make the headaches worse.  Sometimes being touch in the head will trigger a migraine. Topamax at low dose did not help much.   A higher dose is being tried d much.    Triptan such as Imitrex and Maxalt have not really helped reduce the headache pain. Fioricet had not helped in the past. Midrin and Hydrocodone or indomethacin often help some.       MS History:   She was diagnosed with multiple sclerosis in 2010 after presenting with a left foot drop. MRI of the brain was consistent with multiple sclerosis.   She was  started on Rebif. The MS was well controlled but she felt poorly on that medication. She transferred to [facility] and began to see me in 2012. She was placed on Tysabri for about a year and felt much better while on it. Her MS did well on Tysabri as well.  Unfortunately, she was JCV positive and after about a year she stopped Tysabri and started Gilenya. She has done fairly well on Gilenya with no definite exacerbations.      REVIEW OF  SYSTEMS: Constitutional: No fevers, chills, sweats, or change in appetite.  She notes fatigue and insomnia. Eyes: No visual changes, double vision, eye pain Ear, nose and throat: No hearing loss, ear pain, nasal congestion, sore throat Cardiovascular: No chest pain, palpitations Respiratory: No shortness of breath at rest or  with exertion.   No wheezes GastrointestinaI: No nausea, vomiting, diarrhea, abdominal pain, fecal incontinence Genitourinary: No dysuria, urinary retention or frequency.  No nocturia. Musculoskeletal: No neck pain, back .  Notes myalgias at times Integumentary: No rash, pruritus, skin lesions Neurological: as above Psychiatric: No depression at this time.  No anxiety Endocrine: No palpitations, diaphoresis, change in appetite, change in weigh or increased thirst Hematologic/Lymphatic: No anemia, purpura, petechiae. Allergic/Immunologic: No itchy/runny eyes, nasal congestion, recent allergic reactions, rashes.  She has some food allergies.  ALLERGIES: Allergies  Allergen Reactions  . Amoxicillin Hives    Has patient had a PCN reaction causing immediate rash, facial/tongue/throat swelling, SOB or lightheadedness with hypotension: Yes Has patient had a PCN reaction causing severe rash involving mucus membranes or skin necrosis: Yes Has patient had a PCN reaction that required hospitalization No Has patient had a PCN reaction occurring within the last 10 years: No If all of the above answers are "NO", then may proceed with Cephalosporin use.   . Milk-Related Compounds Diarrhea and Nausea And Vomiting    Stomach cramps  . Tinidazole Other (See Comments)    Causes seizures.    HOME MEDICATIONS:  Current Outpatient Medications:  .  cyclobenzaprine (FLEXERIL) 5 MG tablet, Take 1 tablet (5 mg total) by mouth 3 (three) times daily as needed for muscle spasms., Disp: 90 tablet, Rfl: 0 .  GILENYA 0.5 MG CAPS, TAKE ONE CAPSULE (0.5 MG) BY MOUTH ONCE DAILY. MAY TAKE  WITH OR WITHOUT FOOD. STORE AT ROOM TEMPERATURE., Disp: 90 capsule, Rfl: 3 .  levocetirizine (XYZAL) 5 MG tablet, Take 5 mg by mouth every evening., Disp: , Rfl:  .  ranitidine (ZANTAC) 150 MG tablet, Take 1 tablet (150 mg total) by mouth 2 (two) times daily for 14 days., Disp: 28 tablet, Rfl: 0 .  topiramate (TOPAMAX) 100 MG tablet, TAKE 1 TABLET BY MOUTH EVERY DAY, Disp: 90 tablet, Rfl: 1  PAST MEDICAL HISTORY: Past Medical History:  Diagnosis Date  . Allergy   . Anemia    HEAVY PERIODS  . Headache(784.0)    MIGRAINES  . Multiple sclerosis ([place])   . Neuromuscular disorder ([place])    MS  . Pulmonary embolism ([place]) 2013   lt-?bcp  . Wears glasses     PAST SURGICAL HISTORY: Past Surgical History:  Procedure Laterality Date  . BILATERAL SALPINGECTOMY Bilateral 12/27/2015   Procedure: BILATERAL SALPINGECTOMY;  Surgeon: Everett Graff, MD;  Location: [facility];  Service: Gynecology;  Laterality: Bilateral;  Excision of paratubal cyst  . BREAST LUMPECTOMY WITH RADIOACTIVE SEED LOCALIZATION Left 10/06/2013   Procedure:  RADIOACTIVE SEED LOCALIZATION LEFT BREAST LUMPECTOMY;  Surgeon: Odis Hollingshead, MD;  Location: [facility];  Service: General;  Laterality: Left;  . BREAST REDUCTION SURGERY    . CYSTOSCOPY N/A 12/27/2015   Procedure: CYSTOSCOPY;  Surgeon: Everett Graff, MD;  Location: [facility];  Service: Gynecology;  Laterality: N/A;  . REPAIR VAGINAL CUFF N/A 01/09/2016   Procedure: REPAIR VAGINAL CUFF;  Surgeon: Everett Graff, MD;  Location: [facility];  Service: Gynecology;  Laterality: N/A;  . VAGINAL HYSTERECTOMY Bilateral 12/27/2015   Procedure: HYSTERECTOMY VAGINAL;  Surgeon: Everett Graff, MD;  Location: [facility];  Service: Gynecology;  Laterality: Bilateral;  . WISDOM TOOTH EXTRACTION      FAMILY HISTORY: Family History  Problem Relation Age of Onset  . Asthma Mother   . Hypertension Father   . Cancer Paternal Uncle   . Cancer Paternal Grandfather  SOCIAL  HISTORY:  Social History   Socioeconomic History  . Marital status: Married    Spouse name: Not on file  . Number of children: Not on file  . Years of education: Not on file  . Highest education level: Not on file  Occupational History  . Not on file  Social Needs  . Financial resource strain: Not on file  . Food insecurity:    Worry: Not on file    Inability: Not on file  . Transportation needs:    Medical: Not on file    Non-medical: Not on file  Tobacco Use  . Smoking status: Never Smoker  . Smokeless tobacco: Never Used  Substance and Sexual Activity  . Alcohol use: Yes    Alcohol/week: 0.0 oz    Comment: occ  . Drug use: No  . Sexual activity: Yes    Birth control/protection: None    Comment: VASECTOMY  Lifestyle  . Physical activity:    Days per week: Not on file    Minutes per session: Not on file  . Stress: Not on file  Relationships  . Social connections:    Talks on phone: Not on file    Gets together: Not on file    Attends religious service: Not on file    Active member of club or organization: Not on file    Attends meetings of clubs or organizations: Not on file    Relationship status: Not on file  . Intimate partner violence:    Fear of current or ex partner: Not on file    Emotionally abused: Not on file    Physically abused: Not on file    Forced sexual activity: Not on file  Other Topics Concern  . Not on file  Social History Narrative  . Not on file     PHYSICAL EXAM  Vitals:   08/28/17 1602  BP: 126/86  Pulse: 71  Resp: 16  Weight: 176 lb 8 oz (80.1 kg)  Height: 5\' 5"  (1.651 m)    Body mass index is 29.37 kg/m.   General: The patient is well-developed and well-nourished and in no acute distress   Musculoskeletal:  Neck has good ROM and is nontender  Eyes:   Optic nerves and retinal vessels are nornal.     Neurologic Exam  Mental status: The patient is alert and oriented x 3 at the time of the examination. The patient  has apparent normal recent and remote memory, with an apparently normal attention span and concentration ability.   Speech is normal.  Cranial nerves: Extraocular movements are full.  Facial strength and sensation are normal.   trapezius strength is normal. . The tongue is midline, and the patient has symmetric elevation of the soft palate. No obvious hearing deficits are noted.  Motor:  Muscle bulk is normal.   Tone is normal. Strength is  5 / 5 in all 4 extremities.   Sensory: He has intact sensation to touch and vibration in the arms and legs.  Coordination: Cerebellar testing reveals good finger-nose-finger and heel-to-shin bilaterally.  Gait and station: Station is normal.   Gait is normal. The tandem gait is mildly wide. Romberg is negative.   Reflexes: Deep tendon reflexes are symmetric and normal bilaterally.        DIAGNOSTIC DATA (LABS, IMAGING, TESTING) - I reviewed patient records, labs, notes, testing and imaging myself where available.  Lab Results  Component Value Date   WBC 3.3 (L) 06/21/2017  HGB 13.0 06/21/2017   HCT 38.2 06/21/2017   MCV 92.7 06/21/2017   PLT 266 06/21/2017        ASSESSMENT AND PLAN  Multiple sclerosis ([place]) - Plan: CBC with Differential/Platelet  Common migraine without intractability  Insomnia, unspecified type  Other fatigue  Dysesthesia  High risk medication use   1.   Continue Gilenya. Check Cbc with diff 2.   She would prefer to hold off on MRI of the brain and cervical spine as her copay was high when we wanted to do earlier this year at the last visit. 3.   Neck pain is improving so likely musculoskeletal.  Flexeril prn.     Toradol 60 mg IM for HA she has right now.   She needs to call us if neck pain worsens or fevers.       4.   Continue gabapentin for the dysesthesias. 5.   She will return in 6 months or sooner if there are new or worsening neurologic symptoms.   Calea Hribar A. Felecia Shelling, MD, PhD 4/66/5993, 5:70  PM Certified in Neurology, Clinical Neurophysiology, Sleep Medicine, Pain Medicine and Neuroimaging  [facility] [address], [place], [place] [address] [phone]

## 2017-08-28 NOTE — Patient Instructions (Signed)
If headaches or neck pain worsens, call us to discuss

## 2017-08-29 LAB — CBC WITH DIFFERENTIAL/PLATELET
BASOS ABS: 0 10*3/uL (ref 0.0–0.2)
Basos: 1 %
EOS (ABSOLUTE): 0 10*3/uL (ref 0.0–0.4)
Eos: 1 %
Hematocrit: 35 % (ref 34.0–46.6)
Hemoglobin: 11.7 g/dL (ref 11.1–15.9)
IMMATURE GRANS (ABS): 0 10*3/uL (ref 0.0–0.1)
Immature Granulocytes: 0 %
LYMPHS: 7 %
Lymphocytes Absolute: 0.2 10*3/uL — ABNORMAL LOW (ref 0.7–3.1)
MCH: 31.5 pg (ref 26.6–33.0)
MCHC: 33.4 g/dL (ref 31.5–35.7)
MCV: 94 fL (ref 79–97)
Monocytes Absolute: 0.3 10*3/uL (ref 0.1–0.9)
Monocytes: 11 %
NEUTROS ABS: 2.5 10*3/uL (ref 1.4–7.0)
NEUTROS PCT: 80 %
PLATELETS: 292 10*3/uL (ref 150–450)
RBC: 3.72 x10E6/uL — ABNORMAL LOW (ref 3.77–5.28)
RDW: 12.9 % (ref 12.3–15.4)
WBC: 3.2 10*3/uL — ABNORMAL LOW (ref 3.4–10.8)

## 2017-09-02 ENCOUNTER — Telehealth: Payer: Self-pay | Admitting: Neurology

## 2017-09-02 MED ORDER — SULFAMETHOXAZOLE-TRIMETHOPRIM 800-160 MG PO TABS: 1.0000 | ORAL_TABLET | Freq: Two times a day (BID) | ORAL | 0 refills | Status: DC

## 2017-09-02 NOTE — Telephone Encounter (Signed)
I called and left a detailed message on patient's cell, ok per DPR. Rx sent to pharmacy.

## 2017-09-02 NOTE — Telephone Encounter (Signed)
I spoke with Dr. Felecia Shelling and got VO for Bactrim DS 1 tablet BID x 7 days, and if no better patient to call and we will need to get a urine culture.

## 2017-09-02 NOTE — Telephone Encounter (Signed)
Patient calling stating she saw Dr. Felecia Shelling last week and discussed that she may have a urinary tract infection.She thinks she does have an infection. Can medication be called to [facility] on [address]. Please call and advise.

## 2017-09-02 NOTE — Addendum Note (Signed)
Addended by: Belinda Block A on: 09/02/2017 09:20 AM   Modules accepted: Orders

## 2017-10-17 ENCOUNTER — Telehealth: Payer: Self-pay | Admitting: *Deleted

## 2017-10-17 NOTE — Telephone Encounter (Signed)
PA for Gilenya 0.5mg  capsules completed and faxed to [facility], fax# [phone].  Dx: RRMS (G35).  Tried and failed meds: Rebif (intolerance), Tysabri (high JCV ab titer)/fim

## 2017-10-21 NOTE — Telephone Encounter (Signed)
Fax received from [facility], phone# [phone]. Gilenya PA approved for dates 10/17/17-10/18/19.  PA# [place] [phone] A KJ/fim

## 2017-11-22 ENCOUNTER — Other Ambulatory Visit: Payer: Self-pay | Admitting: Neurology

## 2017-11-29 ENCOUNTER — Encounter: Payer: Self-pay | Admitting: Nurse Practitioner

## 2017-11-29 ENCOUNTER — Ambulatory Visit: Payer: BC Managed Care – PPO | Admitting: Nurse Practitioner

## 2017-11-29 VITALS — BP 118/80 | HR 88 | Temp 97.9°F | Ht 63.0 in | Wt 165.2 lb

## 2017-11-29 DIAGNOSIS — J0100 Acute maxillary sinusitis, unspecified: Secondary | ICD-10-CM | POA: Diagnosis not present

## 2017-11-29 DIAGNOSIS — R296 Repeated falls: Secondary | ICD-10-CM

## 2017-11-29 DIAGNOSIS — J029 Acute pharyngitis, unspecified: Secondary | ICD-10-CM

## 2017-11-29 MED ORDER — AZITHROMYCIN 250 MG PO TABS: ORAL_TABLET | ORAL | 0 refills | Status: DC

## 2017-11-29 MED ORDER — FLUCONAZOLE 150 MG PO TABS: ORAL_TABLET | ORAL | 0 refills | Status: DC

## 2017-11-29 NOTE — Progress Notes (Signed)
Subjective:     Patient ID: Kelly Alvarado , female    DOB: 1976/01/24 , 42 y.o.   MRN: [id number]   Chief Complaint  Patient presents with  . Sore Throat    pt states it feels like she is swallowing razors and she states her teeth hurt    HPI  Sore Throat   This is a new problem. The current episode started in the past 7 days. Sore throat worse side: she is having pain to all of her teeth.  There has been no fever. Pertinent negatives include no coughing, ear discharge, ear pain, headaches, shortness of breath or trouble swallowing. She has had no exposure to strep or mono. She has tried NSAIDs for the symptoms. The treatment provided no relief.     Past Medical History:  Diagnosis Date  . Allergy   . Anemia    HEAVY PERIODS  . Headache(784.0)    MIGRAINES  . Multiple sclerosis ([place])   . Neuromuscular disorder ([place])    MS  . Pulmonary embolism ([place]) 2013   lt-?bcp  . Wears glasses       Current Outpatient Medications:  .  cyclobenzaprine (FLEXERIL) 5 MG tablet, Take 1 tablet (5 mg total) by mouth 3 (three) times daily as needed for muscle spasms., Disp: 90 tablet, Rfl: 0 .  GILENYA 0.5 MG CAPS, TAKE ONE CAPSULE (0.5 MG) BY MOUTH ONCE DAILY. MAY TAKE WITH OR WITHOUT FOOD. STORE AT ROOM TEMPERATURE., Disp: 90 capsule, Rfl: 3 .  levocetirizine (XYZAL) 5 MG tablet, Take 5 mg by mouth every evening., Disp: , Rfl:  .  topiramate (TOPAMAX) 100 MG tablet, TAKE 1 TABLET BY MOUTH EVERY DAY, Disp: 90 tablet, Rfl: 1 .  ranitidine (ZANTAC) 150 MG tablet, Take 1 tablet (150 mg total) by mouth 2 (two) times daily for 14 days., Disp: 28 tablet, Rfl: 0   Allergies  Allergen Reactions  . Amoxicillin Hives    Has patient had a PCN reaction causing immediate rash, facial/tongue/throat swelling, SOB or lightheadedness with hypotension: Yes Has patient had a PCN reaction causing severe rash involving mucus membranes or skin necrosis: Yes Has patient had a PCN reaction that required  hospitalization No Has patient had a PCN reaction occurring within the last 10 years: No If all of the above answers are "NO", then may proceed with Cephalosporin use.   . Milk-Related Compounds Diarrhea and Nausea And Vomiting    Stomach cramps  . Tinidazole Other (See Comments)    Causes seizures.     Review of Systems  Constitutional: Negative.   HENT: Positive for sinus pressure and sore throat. Negative for ear discharge, ear pain, postnasal drip, rhinorrhea, tinnitus and trouble swallowing.   Eyes: Negative.   Respiratory: Negative.  Negative for cough and shortness of breath.   Cardiovascular: Negative.   Gastrointestinal: Negative.   Neurological: Negative.  Negative for headaches.       She has had 3 falls in the last week.     Today's Vitals   11/29/17 0955  BP: 118/80  Pulse: 88  Temp: 97.9 F (36.6 C)  TempSrc: Oral  SpO2: 94%  Weight: 165 lb 3.2 oz (74.9 kg)  Height: 5\' 3"  (1.6 m)  PainSc: 6   PainLoc: Throat   Body mass index is 29.26 kg/m.   Objective:  Physical Exam  Constitutional: She is oriented to person, place, and time. She appears well-developed and well-nourished.  HENT:  Head: Normocephalic.  Mouth/Throat: Uvula is  midline, oropharynx is clear and moist and mucous membranes are normal. Tonsils are 0 on the right. Tonsils are 0 on the left.  Neck: Trachea normal and full passive range of motion without pain.  Cardiovascular: Regular rhythm.  Pulmonary/Chest: Effort normal and breath sounds normal.  Neurological: She is alert and oriented to person, place, and time.  Left lower extremity strenghth 4/5 compared to right lower extremity 5/5  Skin: Skin is warm and dry.        Assessment And Plan:     1. Acute non-recurrent maxillary sinusitis  Maxillary sinus pressure bilaterally.    Will treat with azithromycin due to currently taking doxycycline for acne and allergy to PCN - azithromycin (ZITHROMAX) 250 MG tablet; Take 2 tabs today  then take 1 tablet once daily for 4 days.  Dispense: 6 each; Refill: 0 - fluconazole (DIFLUCAN) 150 MG tablet; Take one tablet by mouth at onset of symptoms and repeat in 5 days.  Dispense: 2 tablet; Refill: 0  2. Sore throat  Negative strep test.   Encouraged to do warm salt water gargles. - POCT rapid strep A   [name], FNP

## 2017-12-04 LAB — POCT RAPID STREP A (OFFICE): RAPID STREP A SCREEN: NEGATIVE

## 2017-12-05 ENCOUNTER — Encounter: Payer: Self-pay | Admitting: Neurology

## 2017-12-05 ENCOUNTER — Telehealth: Payer: Self-pay | Admitting: *Deleted

## 2017-12-05 ENCOUNTER — Ambulatory Visit: Payer: BC Managed Care – PPO | Admitting: Neurology

## 2017-12-05 ENCOUNTER — Telehealth: Payer: Self-pay | Admitting: Neurology

## 2017-12-05 VITALS — BP 118/77 | HR 70 | Ht 63.0 in | Wt 164.5 lb

## 2017-12-05 DIAGNOSIS — R208 Other disturbances of skin sensation: Secondary | ICD-10-CM

## 2017-12-05 DIAGNOSIS — Z79899 Other long term (current) drug therapy: Secondary | ICD-10-CM | POA: Diagnosis not present

## 2017-12-05 DIAGNOSIS — G35 Multiple sclerosis: Secondary | ICD-10-CM | POA: Diagnosis not present

## 2017-12-05 DIAGNOSIS — R5383 Other fatigue: Secondary | ICD-10-CM

## 2017-12-05 NOTE — Telephone Encounter (Signed)
Called and spoke with pt. Scheduled work in visit today at 1130am, check in 11:00am. Dr. Felecia Shelling would like her to come in to be seen. She verbalized understanding and appreciation.

## 2017-12-05 NOTE — Telephone Encounter (Signed)
Patient called and stated that she is having some falls and symptoms such as coldness on her right side. She is also having tingling sensations in her right side. She normally experiences symptoms in her left side. She would like to discuss this with the nurse and she would like to know if she should come in sooner than Jan. Please call and advise.

## 2017-12-05 NOTE — Progress Notes (Signed)
[facility]  PATIENT: Kelly Alvarado DOB: February 04, 1976  REFERRING DOCTOR OR PCP:  [name]  SOURCE: patient and records  _________________________________   HISTORICAL  CHIEF COMPLAINT:  Chief Complaint  Patient presents with  . Follow-up    RM 12. Last seen 08/28/17. Here as a work in. She has fallen 3x in last two weeks. Right arm ting/cold. This started within the last week. Having right arm weakness. She has a black eye from fall. She has never fallen like this before. This is new. She saw PCP last Friday d/t falls. They thought her left side was more week at that time. Gave her zpack d/t fluid in her ears. They thought this may have been affecting her balance. She is finished with this.  . Multiple Sclerosis    Takes Gilenya. Tolerating well, denies missing any doses.   Marland Kitchen dysesthesias    HISTORY OF PRESENT ILLNESS:  Kelly Alvarado is a 42 year old woman with MS and h/o a seizure in 2017.  Update 12/05/2017: She is on Gilenya and has tolerated it well.    She has had 3 falls over the past 2 weeks, 2 at work and one at home. She also notes numbness with a cold sensation in her right arm.   She notes she has dropped some items.   Her legs are not feeling weaker.   Her falls have been trips, twice not clearing steps.   She is not sure which foot did not clear the steps.    She had a rotational vertigo episode a week ago.    She  Had been on Tysabri and did great but was JCV Ab positive.   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    Update 08/28/2017: She feels her MS is mostly stable.  She is on Gilenya and she tolerates it well.  Although she has no definite exacerbations, she is not feeling as good over the last 2 weeks (AC was broken and temps were elevated).    It is fixed now but she only feels slightly better.    She remains fatigued and feels achy .   The soreness is mostly in the limbs and neck  and around her torso.   Her neck is stiff.       She has had more trouble with insomnia, both sleep onset and sleep maintenance.   She used to sleep much better.     She has achiness in both hands. She takes gabapentin 600 mg at night and was sleeping well until this recent episode.   It also helped her dysesthesias until the last week.     She denies change in bladder function with some frequency.   She notes reduced vision but has not seen ophthalmology recently.       No recent seizures.    She feels migraines are about the same about 2 days a week.     She is still on topiramate and takes naprosyn when she has one.    At the beginning of the visit, she had no headache but noted one after the examination.     She denies any fevers.     Update 02/20/2016: She is on Gilenya and has not had any definite exacerbations.   He tolerates it well.   No new weakness r gait change.   However, she has had more painful burning dysesthesia in her leg up  to the chest, mostly on her right side.    Initially, this only happened at night but now is happening day and night.   Sometimes she feels weaker afterwards and she may have mild aching pain in between the pain.   These episodes last a minute or two at a time and occur several t times a week but sometimes up to 5 times a day.   Compression stockings and ice have not helped.  In the past, she had been on gabapentin for headache but not for this symptoms.   It had not help ed her other symptom.  She thinks she tolerated it well.       Recent labwork shows lymphocyte count = 0.2.   LFTs were normal.  Migraines are occurring about once a week for 36-48 hours.   Triptans have not helped.    She usually takes several Tylenols but that usually does not help.    Naproxen sometimes helps more but upsets her stomach.     She notes urinary frequency but no incontinence.     She has not had any more seizures.     From 01/03/2016: MS:   She feels stable and denies any new  symptoms since the last visit.   She is taking Gilenya daily and tolerates it well. She has not had any definite exacerbations while she has taken the medication.    Dysesthesia:   She is having electric shock like sensations all over but worse in right arm and legs.    She feels these dysesthesias are slightly better since starting Topamax and she tolerates it well.    She denies Lhermite's signs.  Shocks are random and frequent.     She had more a hysterectomy last week and had more dysesthesia afterwards, like the right leg was on fire.             Gait/strength: She feels her gait returned to normal after initial exacerbation. Strength  is normal in the limbs.  Vision: She has mild reduced color saturation out of the right eye and notes mild blurriness at times. No diplopia.    Bladder:   She has occasional urinary frequency or hesitancy. No incontinence.   No hesitancy  Fatigue/sleep: She notes fatigue that gets worse as the day goes on. This is both physical and cognitive. She has gained some weight even though she is trying to stay active. Insomnia is a little worse (both sleep onset and maintenance).        Mood/cognition:   She denies any depression or anxiety. No major cognitive dysfunction but does have a little bit of issues at times. Mostly she finds that she has decreased focus and attention. In the past, she was diagnosed with ADD. Vyvanse was not well tolerated.    Migraines: She is having migraines about 7-8 days a month. Typically migraine lasts 2 days. There occur randomly or associated with her cycle.   When present, the pain is usually over the left eye and is constant, more than throbbing. When severe it is bilateral.   She will sometimes have a mild visual aura before the migraine. When present, she will have photophobia and phonophobia. She will get nausea and occasionally will get vomiting. Moving will make the headaches worse.  Sometimes being touch in the head will trigger a  migraine. Topamax at low dose did not help much.   A higher dose is being tried d much.    Triptan such  as Imitrex and Maxalt have not really helped reduce the headache pain. Fioricet had not helped in the past. Midrin and Hydrocodone or indomethacin often help some.       MS History:   She was diagnosed with multiple sclerosis in 2010 after presenting with a left foot drop. MRI of the brain was consistent with multiple sclerosis.   She was  started on Rebif. The MS was well controlled but she felt poorly on that medication. She transferred to [facility] and began to see me in 2012. She was placed on Tysabri for about a year and felt much better while on it. Her MS did well on Tysabri as well.  Unfortunately, she was JCV positive and after about a year she stopped Tysabri and started Gilenya. She has done fairly well on Gilenya with no definite exacerbations.      REVIEW OF SYSTEMS: Constitutional: No fevers, chills, sweats, or change in appetite.  She notes fatigue and insomnia. Eyes: No visual changes, double vision, eye pain Ear, nose and throat: No hearing loss, ear pain, nasal congestion, sore throat Cardiovascular: No chest pain, palpitations Respiratory: No shortness of breath at rest or with exertion.   No wheezes GastrointestinaI: No nausea, vomiting, diarrhea, abdominal pain, fecal incontinence Genitourinary: No dysuria, urinary retention or frequency.  No nocturia. Musculoskeletal: No neck pain, back .  Notes myalgias at times Integumentary: No rash, pruritus, skin lesions Neurological: as above Psychiatric: No depression at this time.  No anxiety Endocrine: No palpitations, diaphoresis, change in appetite, change in weigh or increased thirst Hematologic/Lymphatic: No anemia, purpura, petechiae. Allergic/Immunologic: No itchy/runny eyes, nasal congestion, recent allergic reactions, rashes.  She has some food allergies.  ALLERGIES: Allergies  Allergen Reactions  .  Amoxicillin Hives    Has patient had a PCN reaction causing immediate rash, facial/tongue/throat swelling, SOB or lightheadedness with hypotension: Yes Has patient had a PCN reaction causing severe rash involving mucus membranes or skin necrosis: Yes Has patient had a PCN reaction that required hospitalization No Has patient had a PCN reaction occurring within the last 10 years: No If all of the above answers are "NO", then may proceed with Cephalosporin use.   . Milk-Related Compounds Diarrhea and Nausea And Vomiting    Stomach cramps    HOME MEDICATIONS:  Current Outpatient Medications:  .  azithromycin (ZITHROMAX) 250 MG tablet, Take 2 tabs today then take 1 tablet once daily for 4 days., Disp: 6 each, Rfl: 0 .  cyclobenzaprine (FLEXERIL) 5 MG tablet, Take 1 tablet (5 mg total) by mouth 3 (three) times daily as needed for muscle spasms., Disp: 90 tablet, Rfl: 0 .  fluconazole (DIFLUCAN) 150 MG tablet, Take one tablet by mouth at onset of symptoms and repeat in 5 days., Disp: 2 tablet, Rfl: 0 .  gabapentin (NEURONTIN) 300 MG capsule, Take 600 mg by mouth at bedtime. , Disp: , Rfl: 11 .  GILENYA 0.5 MG CAPS, TAKE ONE CAPSULE (0.5 MG) BY MOUTH ONCE DAILY. MAY TAKE WITH OR WITHOUT FOOD. STORE AT ROOM TEMPERATURE., Disp: 90 capsule, Rfl: 3 .  levocetirizine (XYZAL) 5 MG tablet, Take 5 mg by mouth every evening., Disp: , Rfl:  .  topiramate (TOPAMAX) 100 MG tablet, TAKE 1 TABLET BY MOUTH EVERY DAY, Disp: 90 tablet, Rfl: 1  PAST MEDICAL HISTORY: Past Medical History:  Diagnosis Date  . Allergy   . Anemia    HEAVY PERIODS  . Headache(784.0)    MIGRAINES  . Multiple sclerosis ([place])   .  Neuromuscular disorder ([place])    MS  . Pulmonary embolism ([place]) 2013   lt-?bcp  . Wears glasses     PAST SURGICAL HISTORY: Past Surgical History:  Procedure Laterality Date  . BILATERAL SALPINGECTOMY Bilateral 12/27/2015   Procedure: BILATERAL SALPINGECTOMY;  Surgeon: Everett Graff, MD;  Location:  [facility];  Service: Gynecology;  Laterality: Bilateral;  Excision of paratubal cyst  . BREAST LUMPECTOMY WITH RADIOACTIVE SEED LOCALIZATION Left 10/06/2013   Procedure:  RADIOACTIVE SEED LOCALIZATION LEFT BREAST LUMPECTOMY;  Surgeon: Odis Hollingshead, MD;  Location: [facility];  Service: General;  Laterality: Left;  . BREAST REDUCTION SURGERY    . CYSTOSCOPY N/A 12/27/2015   Procedure: CYSTOSCOPY;  Surgeon: Everett Graff, MD;  Location: [facility];  Service: Gynecology;  Laterality: N/A;  . REPAIR VAGINAL CUFF N/A 01/09/2016   Procedure: REPAIR VAGINAL CUFF;  Surgeon: Everett Graff, MD;  Location: [facility];  Service: Gynecology;  Laterality: N/A;  . VAGINAL HYSTERECTOMY Bilateral 12/27/2015   Procedure: HYSTERECTOMY VAGINAL;  Surgeon: Everett Graff, MD;  Location: [facility];  Service: Gynecology;  Laterality: Bilateral;  . WISDOM TOOTH EXTRACTION      FAMILY HISTORY: Family History  Problem Relation Age of Onset  . Asthma Mother   . Hypertension Father   . Cancer Paternal Uncle   . Cancer Paternal Grandfather     SOCIAL HISTORY:  Social History   Socioeconomic History  . Marital status: Married    Spouse name: Not on file  . Number of children: Not on file  . Years of education: Not on file  . Highest education level: Not on file  Occupational History  . Not on file  Social Needs  . Financial resource strain: Not on file  . Food insecurity:    Worry: Not on file    Inability: Not on file  . Transportation needs:    Medical: Not on file    Non-medical: Not on file  Tobacco Use  . Smoking status: Never Smoker  . Smokeless tobacco: Never Used  Substance and Sexual Activity  . Alcohol use: Yes    Alcohol/week: 0.0 standard drinks    Comment: occ  . Drug use: No  . Sexual activity: Yes    Birth control/protection: None    Comment: VASECTOMY  Lifestyle  . Physical activity:    Days per week: Not on file    Minutes per session: Not on file  . Stress: Not on file   Relationships  . Social connections:    Talks on phone: Not on file    Gets together: Not on file    Attends religious service: Not on file    Active member of club or organization: Not on file    Attends meetings of clubs or organizations: Not on file    Relationship status: Not on file  . Intimate partner violence:    Fear of current or ex partner: Not on file    Emotionally abused: Not on file    Physically abused: Not on file    Forced sexual activity: Not on file  Other Topics Concern  . Not on file  Social History Narrative   Right handed   Caffeine use: sometimes     PHYSICAL EXAM  Vitals:   12/05/17 1120  BP: 118/77  Pulse: 70  Weight: 164 lb 8 oz (74.6 kg)  Height: 5\' 3"  (1.6 m)    Body mass index is 29.14 kg/m.   General: The patient is well-developed  and well-nourished and in no acute distress   Musculoskeletal:  Neck has good ROM and is nontender.  She is tender over the right subacromial bursa.     Neurologic Exam  Mental status: The patient is alert and oriented x 3 at the time of the examination. The patient has apparent normal recent and remote memory, with an apparently normal attention span and concentration ability.   Speech is normal.  Cranial nerves: Extraocular movements are full.  Facial strength and sensation was normal.  Trapezius strength was normal.. No obvious hearing deficits are noted.  Motor:  Muscle bulk is normal.   Tone is normal. Strength is  5 / 5 in all 4 extremities except 4++ in triceps and finger extensors on the right and 4+ in toe extensors on the left.   Sensory: She has intact sensation to touch and vibration in the arms and legs.  Coordination: Cerebellar testing reveals good finger-nose-finger and heel-to-shin bilaterally.  Gait and station: Station is normal.   Gait is normal. The tandem gait is mildly wide. Romberg is negative.   Reflexes: Deep tendon reflexes are symmetric and normal bilaterally.         DIAGNOSTIC DATA (LABS, IMAGING, TESTING) - I reviewed patient records, labs, notes, testing and imaging myself where available.  Lab Results  Component Value Date   WBC 3.2 (L) 08/28/2017   HGB 11.7 08/28/2017   HCT 35.0 08/28/2017   MCV 94 08/28/2017   PLT 292 08/28/2017        ASSESSMENT AND PLAN  Multiple sclerosis ([place]) - Plan: MR BRAIN W WO CONTRAST, MR CERVICAL SPINE W WO CONTRAST, CBC with Differential/Platelet, Stratify JCV Antibody Test ([facility]), Comprehensive metabolic panel  Other fatigue  Dysesthesia  High risk medication use   1.   She  Is likely having a small exacerbation.   Since greater than a week will hold off on steroids.   Check MRI of hte brain and cerv spine.    2.   Continue Gilenya for now but we discussed options if MRI has had significant changes or any enhancing lesions or if she has another exacerbation. Check Cbc with diff, CMP and JCV antibody (she had been positive in the past but that was before titers were available so we do not know if she was high on low. 3   Continue gabapentin for the dysesthesias. 4.   She will return in 6 months or sooner if there are new or worsening neurologic symptoms.   Syaire Saber A. Felecia Shelling, MD, PhD 38/75/6433, 2:95 PM Certified in Neurology, Clinical Neurophysiology, Sleep Medicine, Pain Medicine and Neuroimaging  [facility] [address] [place], [place] [address] [phone]

## 2017-12-05 NOTE — Telephone Encounter (Signed)
Placed JCV lab in [facility] lock box for routine lab pick up.  

## 2017-12-05 NOTE — Telephone Encounter (Signed)
I called patient back. She has fallen 3x in last two weeks. Right arm ting/cold. This started within the last week. Having right arm weakness. She has a black eye from fall. She has never fallen like this before. This is new.   She saw PCP last Friday d/t increased falls. NP/PA that saw her, thought her left side was more week at that time. They provided her with a zpack. also because they saw fluid in her ears. They thought this may have been affecting her balance. She has finished this.   Advised I will speak with Dr. Felecia Shelling and call her back. She verbalized understanding.

## 2017-12-06 ENCOUNTER — Telehealth: Payer: Self-pay | Admitting: Neurology

## 2017-12-06 LAB — CBC WITH DIFFERENTIAL/PLATELET
BASOS ABS: 0 10*3/uL (ref 0.0–0.2)
BASOS: 0 %
EOS (ABSOLUTE): 0 10*3/uL (ref 0.0–0.4)
Eos: 1 %
HEMOGLOBIN: 11.4 g/dL (ref 11.1–15.9)
Hematocrit: 33.7 % — ABNORMAL LOW (ref 34.0–46.6)
IMMATURE GRANS (ABS): 0 10*3/uL (ref 0.0–0.1)
Immature Granulocytes: 0 %
LYMPHS: 7 %
Lymphocytes Absolute: 0.2 10*3/uL — ABNORMAL LOW (ref 0.7–3.1)
MCH: 31.8 pg (ref 26.6–33.0)
MCHC: 33.8 g/dL (ref 31.5–35.7)
MCV: 94 fL (ref 79–97)
Monocytes Absolute: 0.3 10*3/uL (ref 0.1–0.9)
Monocytes: 11 %
NEUTROS ABS: 2.4 10*3/uL (ref 1.4–7.0)
Neutrophils: 81 %
PLATELETS: 285 10*3/uL (ref 150–450)
RBC: 3.59 x10E6/uL — ABNORMAL LOW (ref 3.77–5.28)
RDW: 13.1 % (ref 12.3–15.4)
WBC: 3 10*3/uL — ABNORMAL LOW (ref 3.4–10.8)

## 2017-12-06 LAB — COMPREHENSIVE METABOLIC PANEL
ALT: 25 IU/L (ref 0–32)
AST: 18 IU/L (ref 0–40)
Albumin/Globulin Ratio: 1.8 (ref 1.2–2.2)
Albumin: 4.4 g/dL (ref 3.5–5.5)
Alkaline Phosphatase: 84 IU/L (ref 39–117)
BUN/Creatinine Ratio: 9 (ref 9–23)
BUN: 8 mg/dL (ref 6–24)
Bilirubin Total: 0.5 mg/dL (ref 0.0–1.2)
CALCIUM: 9.3 mg/dL (ref 8.7–10.2)
CO2: 18 mmol/L — ABNORMAL LOW (ref 20–29)
Chloride: 109 mmol/L — ABNORMAL HIGH (ref 96–106)
Creatinine, Ser: 0.94 mg/dL (ref 0.57–1.00)
GFR, EST AFRICAN AMERICAN: 85 mL/min/{1.73_m2} (ref >59)
GFR, EST NON AFRICAN AMERICAN: 74 mL/min/{1.73_m2} (ref >59)
GLUCOSE: 76 mg/dL (ref 65–99)
Globulin, Total: 2.4 g/dL (ref 1.5–4.5)
Potassium: 4.1 mmol/L (ref 3.5–5.2)
Sodium: 141 mmol/L (ref 134–144)
TOTAL PROTEIN: 6.8 g/dL (ref 6.0–8.5)

## 2017-12-06 NOTE — Telephone Encounter (Signed)
went over benefits she wants to see what she can do with the MS societ for the MRI and then get back to me it has not been approved yet

## 2017-12-13 ENCOUNTER — Encounter: Payer: BC Managed Care – PPO | Admitting: Nurse Practitioner

## 2017-12-13 NOTE — Telephone Encounter (Signed)
Called [facility] and requested results be faxed to our office at [phone].

## 2017-12-13 NOTE — Telephone Encounter (Signed)
JCV positive at 3.36. Gave to Dr. Felecia Shelling to review.

## 2018-01-05 ENCOUNTER — Other Ambulatory Visit: Payer: Self-pay | Admitting: Nurse Practitioner

## 2018-01-21 ENCOUNTER — Telehealth: Payer: Self-pay | Admitting: Nurse Practitioner

## 2018-01-21 NOTE — Telephone Encounter (Signed)
PT CAME IN STATING THAT SHE THINKS SHE HAS UTI,  PT JUST WANTED MED TO BE SENT INTO PHARMACY ADV PT THAT SHE WILL NEED APPT, DID NOT SCHEDULE APPT

## 2018-02-09 ENCOUNTER — Other Ambulatory Visit: Payer: Self-pay | Admitting: Internal Medicine

## 2018-03-06 ENCOUNTER — Ambulatory Visit: Payer: BC Managed Care – PPO | Admitting: Neurology

## 2018-06-01 ENCOUNTER — Other Ambulatory Visit: Payer: Self-pay | Admitting: Neurology

## 2018-06-25 ENCOUNTER — Telehealth: Payer: Self-pay | Admitting: Neurology

## 2018-06-25 NOTE — Telephone Encounter (Signed)
no to the covid-19 questions MR Brain w/wo contrast & MR Cervical spine w/wo contrast Dr. Cheree Ditto Auth: [id number] (exp. 06/25/18 to 12/21/18). Patient is scheduled at [facility] for 07/01/18

## 2018-07-01 ENCOUNTER — Other Ambulatory Visit: Payer: Self-pay

## 2018-07-01 ENCOUNTER — Ambulatory Visit: Payer: BC Managed Care – PPO

## 2018-07-01 DIAGNOSIS — G35 Multiple sclerosis: Secondary | ICD-10-CM

## 2018-07-01 MED ORDER — GADOBENATE DIMEGLUMINE 529 MG/ML IV SOLN
14.0000 mL | Freq: Once | INTRAVENOUS | Status: AC | PRN
  Administered 2018-07-01: 14 mL via INTRAVENOUS

## 2018-07-03 ENCOUNTER — Telehealth: Payer: Self-pay | Admitting: Neurology

## 2018-07-03 ENCOUNTER — Other Ambulatory Visit: Payer: Self-pay | Admitting: Neurology

## 2018-07-03 MED ORDER — ZONISAMIDE 100 MG PO CAPS: 100.0000 mg | ORAL_CAPSULE | Freq: Every day | ORAL | 5 refills | Status: DC

## 2018-07-03 MED ORDER — AMPHETAMINE-DEXTROAMPHET ER 20 MG PO CP24: 20.0000 mg | ORAL_CAPSULE | Freq: Every day | ORAL | 0 refills | Status: DC

## 2018-07-03 NOTE — Telephone Encounter (Signed)
Pt called to confirm receipt of phone call regarding MRI results.

## 2018-07-03 NOTE — Telephone Encounter (Signed)
Dr. Felecia Shelling- did you call her with results? Also she was last seen 11/2017. Would you like me to schedule her an appt?

## 2018-07-07 ENCOUNTER — Telehealth: Payer: Self-pay | Admitting: *Deleted

## 2018-07-07 NOTE — Telephone Encounter (Signed)
Initiated PA adderall ER 20mg  tab on CMM. [facility] - Rx #: [id number]. Waiting on clinical questions from [facility] to answer.

## 2018-07-08 NOTE — Telephone Encounter (Signed)
Submitted PA on CMM. Waiting on determination.

## 2018-07-08 NOTE — Telephone Encounter (Signed)
Received fax notification from [facility] that [place] approved 07/08/18-07/07/21. PA# [employer] state health plan [phone] non-grandfathered [phone].   Faxed notice of approval to [facility] at [phone]. Received fax confirmation.

## 2018-07-09 ENCOUNTER — Telehealth: Payer: Self-pay | Admitting: Neurology

## 2018-07-09 NOTE — Telephone Encounter (Signed)
Pt called wanting to know about her lab work that she is to get since she is switching from [place] 0.5 MG CAPS to [place]. Please advise.

## 2018-07-09 NOTE — Telephone Encounter (Signed)
I spoke to [place].  She is interested in starting Ocrevus.  We have 2 options.  We could have her come in next week to check the lab work.  Another option is that we are going to be a site for the chimes study which is an Ocrevus open label study in minority patients.  There will be several MRIs, lab work, exams and patient reported outcomes over the 2 years of the study with visits occurring on the same day as the infusions (except for the screen)  She is interested in doing the study.  I will let our research office know.

## 2018-07-17 ENCOUNTER — Other Ambulatory Visit: Payer: Self-pay | Admitting: Neurology

## 2018-08-05 ENCOUNTER — Other Ambulatory Visit: Payer: Self-pay | Admitting: Neurology

## 2018-08-05 DIAGNOSIS — G35 Multiple sclerosis: Secondary | ICD-10-CM

## 2018-08-05 DIAGNOSIS — Z79899 Other long term (current) drug therapy: Secondary | ICD-10-CM

## 2018-08-07 NOTE — Telephone Encounter (Signed)
We discussed that she does not qualify for CHIMES as she has been on more than one MS DMT in the past.     We will get her started on commercial Ocrevus and she will come in Monday or Thursday to get labs and sign the SRF.  She will stop [place] today

## 2018-08-11 ENCOUNTER — Other Ambulatory Visit: Payer: Self-pay

## 2018-08-11 ENCOUNTER — Telehealth: Payer: Self-pay | Admitting: *Deleted

## 2018-08-11 ENCOUNTER — Other Ambulatory Visit (INDEPENDENT_AMBULATORY_CARE_PROVIDER_SITE_OTHER): Payer: Self-pay

## 2018-08-11 DIAGNOSIS — G35 Multiple sclerosis: Secondary | ICD-10-CM

## 2018-08-11 DIAGNOSIS — Z79899 Other long term (current) drug therapy: Secondary | ICD-10-CM

## 2018-08-11 DIAGNOSIS — Z0289 Encounter for other administrative examinations: Secondary | ICD-10-CM

## 2018-08-11 NOTE — Telephone Encounter (Signed)
Gave completed/signed Ocrevus start form intrafusion. Included signed order sheet as well.

## 2018-08-11 NOTE — Telephone Encounter (Signed)
Pt here today to get labs completed. She signed Ocrevus start form. Also provided completed/signed DMV parking placard form to pt per request. Ok per Dr. Felecia Shelling.   Pt temp: 98.4 Weight: 145lb

## 2018-08-12 ENCOUNTER — Telehealth: Payer: Self-pay | Admitting: Neurology

## 2018-08-12 DIAGNOSIS — G35 Multiple sclerosis: Secondary | ICD-10-CM

## 2018-08-12 NOTE — Telephone Encounter (Signed)
WBC was fairly lower than expected at 2.5 and lymphocytes low at 0.3 (typical for Gilenya)  She has stopped Gilenya.   I would like to recheck in 4 weeks 8/3 or 8/4 to make sure in right direction before starting Ocrevus

## 2018-08-14 LAB — CBC WITH DIFFERENTIAL/PLATELET
Basophils Absolute: 0 10*3/uL (ref 0.0–0.2)
Basos: 0 %
EOS (ABSOLUTE): 0 10*3/uL (ref 0.0–0.4)
Eos: 1 %
Hematocrit: 37.5 % (ref 34.0–46.6)
Hemoglobin: 13.1 g/dL (ref 11.1–15.9)
Immature Grans (Abs): 0 10*3/uL (ref 0.0–0.1)
Immature Granulocytes: 0 %
Lymphocytes Absolute: 0.3 10*3/uL — ABNORMAL LOW (ref 0.7–3.1)
Lymphs: 11 %
MCH: 32.8 pg (ref 26.6–33.0)
MCHC: 34.9 g/dL (ref 31.5–35.7)
MCV: 94 fL (ref 79–97)
Monocytes Absolute: 0.2 10*3/uL (ref 0.1–0.9)
Monocytes: 9 %
Neutrophils Absolute: 1.8 10*3/uL (ref 1.4–7.0)
Neutrophils: 79 %
Platelets: 329 10*3/uL (ref 150–450)
RBC: 4 x10E6/uL (ref 3.77–5.28)
RDW: 12.8 % (ref 11.7–15.4)
WBC: 2.3 10*3/uL — CL (ref 3.4–10.8)

## 2018-08-14 LAB — IGG, IGA, IGM
IgA/Immunoglobulin A, Serum: 133 mg/dL (ref 87–352)
IgG (Immunoglobin G), Serum: 1124 mg/dL (ref 586–1602)
IgM (Immunoglobulin M), Srm: 73 mg/dL (ref 26–217)

## 2018-08-14 LAB — QUANTIFERON-TB GOLD PLUS
QuantiFERON Mitogen Value: 4.79 IU/mL
QuantiFERON Nil Value: 0.01 IU/mL
QuantiFERON TB1 Ag Value: 0.02 IU/mL
QuantiFERON TB2 Ag Value: 0.01 IU/mL
QuantiFERON-TB Gold Plus: NEGATIVE

## 2018-08-14 LAB — COMPREHENSIVE METABOLIC PANEL
ALT: 20 IU/L (ref 0–32)
AST: 23 IU/L (ref 0–40)
Albumin/Globulin Ratio: 2 (ref 1.2–2.2)
Albumin: 4.9 g/dL — ABNORMAL HIGH (ref 3.8–4.8)
Alkaline Phosphatase: 81 IU/L (ref 39–117)
BUN/Creatinine Ratio: 12 (ref 9–23)
BUN: 12 mg/dL (ref 6–24)
Bilirubin Total: 0.5 mg/dL (ref 0.0–1.2)
CO2: 19 mmol/L — ABNORMAL LOW (ref 20–29)
Calcium: 10.2 mg/dL (ref 8.7–10.2)
Chloride: 104 mmol/L (ref 96–106)
Creatinine, Ser: 1.04 mg/dL — ABNORMAL HIGH (ref 0.57–1.00)
GFR calc Af Amer: 75 mL/min/{1.73_m2} (ref >59)
GFR calc non Af Amer: 65 mL/min/{1.73_m2} (ref >59)
Globulin, Total: 2.5 g/dL (ref 1.5–4.5)
Glucose: 74 mg/dL (ref 65–99)
Potassium: 4.6 mmol/L (ref 3.5–5.2)
Sodium: 139 mmol/L (ref 134–144)
Total Protein: 7.4 g/dL (ref 6.0–8.5)

## 2018-08-14 LAB — HEPATITIS B CORE ANTIBODY, TOTAL: Hep B Core Total Ab: NEGATIVE

## 2018-08-14 LAB — HEPATITIS C ANTIBODY: Hep C Virus Ab: <0.1 s/co ratio (ref 0.0–0.9)

## 2018-08-14 LAB — HIV ANTIBODY (ROUTINE TESTING W REFLEX): HIV Screen 4th Generation wRfx: NONREACTIVE

## 2018-08-14 LAB — HEPATITIS B SURFACE ANTIBODY,QUALITATIVE: Hep B Surface Ab, Qual: NONREACTIVE

## 2018-08-14 LAB — HEPATITIS B SURFACE ANTIGEN: Hepatitis B Surface Ag: NEGATIVE

## 2018-08-21 ENCOUNTER — Other Ambulatory Visit: Payer: Self-pay | Admitting: Internal Medicine

## 2018-08-27 ENCOUNTER — Encounter: Payer: Self-pay | Admitting: Nurse Practitioner

## 2018-08-27 ENCOUNTER — Ambulatory Visit: Payer: BC Managed Care – PPO | Admitting: Nurse Practitioner

## 2018-08-27 ENCOUNTER — Other Ambulatory Visit: Payer: Self-pay

## 2018-08-27 ENCOUNTER — Telehealth: Payer: Self-pay | Admitting: *Deleted

## 2018-08-27 VITALS — BP 110/60 | HR 79 | Temp 98.4°F | Ht 63.4 in | Wt 144.0 lb

## 2018-08-27 DIAGNOSIS — R8290 Unspecified abnormal findings in urine: Secondary | ICD-10-CM

## 2018-08-27 DIAGNOSIS — Z23 Encounter for immunization: Secondary | ICD-10-CM | POA: Diagnosis not present

## 2018-08-27 DIAGNOSIS — Z6825 Body mass index (BMI) 25.0-25.9, adult: Secondary | ICD-10-CM | POA: Diagnosis not present

## 2018-08-27 DIAGNOSIS — E663 Overweight: Secondary | ICD-10-CM | POA: Diagnosis not present

## 2018-08-27 LAB — POCT URINALYSIS DIPSTICK
Bilirubin, UA: NEGATIVE
Blood, UA: NEGATIVE
Glucose, UA: NEGATIVE
Leukocytes, UA: NEGATIVE
Nitrite, UA: NEGATIVE
Protein, UA: NEGATIVE
Spec Grav, UA: 1.02 (ref 1.010–1.025)
Urobilinogen, UA: 0.2 E.U./dL
pH, UA: 6 (ref 5.0–8.0)

## 2018-08-27 MED ORDER — SAXENDA 18 MG/3ML ~~LOC~~ SOPN: 3.0000 mg | PEN_INJECTOR | Freq: Every day | SUBCUTANEOUS | 1 refills | Status: DC

## 2018-08-27 MED ORDER — TETANUS-DIPHTH-ACELL PERTUSSIS 5-2.5-18.5 LF-MCG/0.5 IM SUSP
0.5000 mL | Freq: Once | INTRAMUSCULAR | Status: AC
  Administered 2018-08-27: 0.5 mL via INTRAMUSCULAR

## 2018-08-27 NOTE — Telephone Encounter (Signed)
Called and spoke with pt. Advised Dr. Felecia Shelling needing to have updated f/u with her to continue Ocrevus infusions. Last f/u 11/2017. Scheduled f/u for 09/02/18 at 330pm. Advised her to wear a mask to appt and temp will be checked prior to coming back.

## 2018-08-27 NOTE — Progress Notes (Signed)
Subjective:     Patient ID: Kelly Alvarado , female    DOB: September 18, 1975 , 42 y.o.   MRN: [id number]   Chief Complaint  Patient presents with  . Weight Check  . Dysuria    patient states her urine has a odor.    HPI  She is taking a b complex.  Gynecology recommended her taking the b-complex  She is on a wash out period of Gilenya, WBC extremely low right now.  She is working from home.    Wt Readings from Last 3 Encounters: 08/27/18 : 144 lb (65.3 kg) 12/05/17 : 164 lb 8 oz (74.6 kg) 11/29/17 : 165 lb 3.2 oz (74.9 kg)  June 2019 - 178 lbs , her initial weight was 226#, tolerating Saxenda well.   Dysuria  This is a new problem. The current episode started more than 1 month ago. She is not sexually active. There is no history of pyelonephritis. Pertinent negatives include no chills or discharge. She has tried nothing for the symptoms. There is no history of catheterization.     Past Medical History:  Diagnosis Date  . Allergy   . Anemia    HEAVY PERIODS  . Headache(784.0)    MIGRAINES  . Multiple sclerosis ([place])   . Neuromuscular disorder ([place])    MS  . Pulmonary embolism ([place]) 2013   lt-?bcp  . Wears glasses      Family History  Problem Relation Age of Onset  . Asthma Mother   . Hypertension Father   . Cancer Paternal Uncle   . Cancer Paternal Grandfather      Current Outpatient Medications:  .  amphetamine-dextroamphetamine (ADDERALL XR) 20 MG 24 hr capsule, Take 1 capsule (20 mg total) by mouth daily., Disp: 30 capsule, Rfl: 0 .  gabapentin (NEURONTIN) 300 MG capsule, TAKE 1 CAPSULE IN AM, AND 1 CAP IN EVENING, AND 2 AT BEDTIME, Disp: 360 capsule, Rfl: 3 .  levocetirizine (XYZAL) 5 MG tablet, TAKE 1 TABLET BY MOUTH EVERY DAY EVERY EVENING, Disp: 90 tablet, Rfl: 1 .  zonisamide (ZONEGRAN) 100 MG capsule, Take 1 capsule (100 mg total) by mouth daily., Disp: 30 capsule, Rfl: 5   Allergies  Allergen Reactions  . Amoxicillin Hives    Has patient had a PCN  reaction causing immediate rash, facial/tongue/throat swelling, SOB or lightheadedness with hypotension: Yes Has patient had a PCN reaction causing severe rash involving mucus membranes or skin necrosis: Yes Has patient had a PCN reaction that required hospitalization No Has patient had a PCN reaction occurring within the last 10 years: No If all of the above answers are "NO", then may proceed with Cephalosporin use.   . Milk-Related Compounds Diarrhea and Nausea And Vomiting    Stomach cramps     Review of Systems  Constitutional: Negative for chills.  Respiratory: Negative.   Cardiovascular: Negative.  Negative for chest pain, palpitations and leg swelling.  Endocrine: Negative for polydipsia, polyphagia and polyuria.  Genitourinary: Positive for dysuria.  Musculoskeletal: Negative for myalgias.  Skin: Negative.   Neurological: Negative for dizziness and headaches.     Today's Vitals   08/27/18 1112  BP: 110/60  Pulse: 79  Temp: 98.4 F (36.9 C)  TempSrc: Oral  Weight: 144 lb (65.3 kg)  Height: 5' 3.4" (1.61 m)  PainSc: 0-No pain   Body mass index is 25.19 kg/m.   Objective:  Physical Exam Vitals signs reviewed.  Constitutional:      Appearance: Normal appearance.  Cardiovascular:     Rate and Rhythm: Normal rate and regular rhythm.     Pulses: Normal pulses.     Heart sounds: Normal heart sounds. No murmur.  Pulmonary:     Effort: Pulmonary effort is normal. No respiratory distress.     Breath sounds: Normal breath sounds.  Neurological:     General: No focal deficit present.     Mental Status: She is alert and oriented to person, place, and time.         Assessment And Plan:     1. Overweight (BMI 25.0-29.9)  She has been doing [place] daily which has done well for her to curb her appetite  She would like to continue, will send rx to pharmacy no samples available  I have also discussed with her to follow up in 2 months  - Liraglutide -Weight  Management (SAXENDA) 18 MG/3ML SOPN; Inject 3 mg into the skin daily.  Dispense: 5 pen; Refill: 1  2. Abnormal urine odor  Negative for nitrates however will send for urine culture  Increase water intake - POCT Urinalysis Dipstick (81002) - Culture, Urine  3. Encounter for immunization  Will give tetanus vaccine today while in office. Refer to order management. TDAP will be administered to adults 25-76 years old every 10 years. - Tdap (BOOSTRIX) injection 0.5 mL   [name], FNP    THE PATIENT IS ENCOURAGED TO PRACTICE SOCIAL DISTANCING DUE TO THE COVID-19 PANDEMIC.

## 2018-09-02 ENCOUNTER — Ambulatory Visit: Payer: BC Managed Care – PPO | Admitting: Neurology

## 2018-09-02 ENCOUNTER — Encounter: Payer: Self-pay | Admitting: Neurology

## 2018-09-02 ENCOUNTER — Other Ambulatory Visit: Payer: Self-pay

## 2018-09-02 VITALS — BP 105/66 | HR 74 | Temp 98.2°F | Ht 63.4 in | Wt 144.0 lb

## 2018-09-02 DIAGNOSIS — G35 Multiple sclerosis: Secondary | ICD-10-CM

## 2018-09-02 DIAGNOSIS — G43709 Chronic migraine without aura, not intractable, without status migrainosus: Secondary | ICD-10-CM | POA: Diagnosis not present

## 2018-09-02 DIAGNOSIS — R208 Other disturbances of skin sensation: Secondary | ICD-10-CM

## 2018-09-02 DIAGNOSIS — Z79899 Other long term (current) drug therapy: Secondary | ICD-10-CM | POA: Diagnosis not present

## 2018-09-02 DIAGNOSIS — G35D Multiple sclerosis, unspecified: Secondary | ICD-10-CM

## 2018-09-02 DIAGNOSIS — R569 Unspecified convulsions: Secondary | ICD-10-CM | POA: Diagnosis not present

## 2018-09-02 DIAGNOSIS — G4700 Insomnia, unspecified: Secondary | ICD-10-CM

## 2018-09-02 DIAGNOSIS — IMO0002 Reserved for concepts with insufficient information to code with codable children: Secondary | ICD-10-CM

## 2018-09-02 MED ORDER — AMPHETAMINE-DEXTROAMPHET ER 25 MG PO CP24: 25.0000 mg | ORAL_CAPSULE | Freq: Every day | ORAL | 0 refills | Status: DC

## 2018-09-02 NOTE — Progress Notes (Signed)
[facility]  PATIENT: Kelly Alvarado DOB: 08-Jan-1976  REFERRING DOCTOR OR PCP:  [name]  SOURCE: patient and records  _________________________________   HISTORICAL  CHIEF COMPLAINT:  Chief Complaint  Patient presents with   Follow-up    RM 12, alone. Last seen 11/2017   Multiple Sclerosis    Scheduled for first Ocrevus infusion 10/07/2018 and 10/21/2018    HISTORY OF PRESENT ILLNESS:  Kelly Alvarado is a 43 year old woman with MS diagnosed in 2010 and h/o a seizure in 2017.  Update 09/02/2018: Repeat MRI 07/01/2018 showed classic MS lesions in the hemispheres and also in the brainstem.  The MRI of the cervical spine showed 3 plaques in the upper cervical spine and one plaque in the upper thoracic spine.  None of these were present on MRI from 2010.  Several of the foci in the brain had not been present on her previous brain MRI.  Last year she had an exacerbation with decreased sensation in the legs and a year before she may have had another exacerbation.  She has been compliant on Gilenya during this time.  We had discussed options over the phone.  Ocrevus may be a little more efficacious than Gilenya.  We discussed there could be additional respiratory infection risks during the COVID-19 pandemic.  We also discussed the risk of breast cancer from the clinical studies  She has numbness in the toes since the exacerbation last year.  We had started gabapentin and increase the dose further last month.  She feels she is doing better on the higher dose..   Gabapentin tid has reduced the dysesthesias.  She has noticed more migraines on zonisamide that she had on topiramate.  However, she tolerates the zonisamide much better and prefers to stay on it.  She is noting more trouble with insomnia.  This is both sleep maintenance and sleep onset.  Adderall helps her attention/focus though does not help her fatigue much.  Update 12/05/2017: She is on Gilenya and has  tolerated it well.    She has had 3 falls over the past 2 weeks, 2 at work and one at home. She also notes numbness with a cold sensation in her right arm.   She notes she has dropped some items.   Her legs are not feeling weaker.   Her falls have been trips, twice not clearing steps.   She is not sure which foot did not clear the steps.    She had a rotational vertigo episode a week ago.    She  Had been on Tysabri and did great but was JCV Ab positive.   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    Update 08/28/2017: She feels her MS is mostly stable.  She is on Gilenya and she tolerates it well.  Although she has no definite exacerbations, she is not feeling as good over the last 2 weeks (AC was broken and temps were elevated).    It is fixed now but she only feels slightly better.    She remains fatigued and feels achy .   The soreness is mostly in the limbs and neck and around her torso.   Her neck is stiff.       She has had more trouble with insomnia, both sleep onset and sleep maintenance.   She used to sleep much better.     She has achiness in both hands. She  takes gabapentin 600 mg at night and was sleeping well until this recent episode.   It also helped her dysesthesias until the last week.     She denies change in bladder function with some frequency.   She notes reduced vision but has not seen ophthalmology recently.       No recent seizures.    She feels migraines are about the same about 2 days a week.     She is still on topiramate and takes naprosyn when she has one.    At the beginning of the visit, she had no headache but noted one after the examination.     She denies any fevers.     Update 02/20/2016: She is on Gilenya and has not had any definite exacerbations.   He tolerates it well.   No new weakness r gait change.   However, she has had more painful burning dysesthesia in her leg up to the chest, mostly on her right  side.    Initially, this only happened at night but now is happening day and night.   Sometimes she feels weaker afterwards and she may have mild aching pain in between the pain.   These episodes last a minute or two at a time and occur several t times a week but sometimes up to 5 times a day.   Compression stockings and ice have not helped.  In the past, she had been on gabapentin for headache but not for this symptoms.   It had not help ed her other symptom.  She thinks she tolerated it well.       Recent labwork shows lymphocyte count = 0.2.   LFTs were normal.  Migraines are occurring about once a week for 36-48 hours.   Triptans have not helped.    She usually takes several Tylenols but that usually does not help.    Naproxen sometimes helps more but upsets her stomach.     She notes urinary frequency but no incontinence.     She has not had any more seizures.     From 01/03/2016: MS:   She feels stable and denies any new symptoms since the last visit.   She is taking Gilenya daily and tolerates it well. She has not had any definite exacerbations while she has taken the medication.    Dysesthesia:   She is having electric shock like sensations all over but worse in right arm and legs.    She feels these dysesthesias are slightly better since starting Topamax and she tolerates it well.    She denies Lhermite's signs.  Shocks are random and frequent.     She had more a hysterectomy last week and had more dysesthesia afterwards, like the right leg was on fire.             Gait/strength: She feels her gait returned to normal after initial exacerbation. Strength  is normal in the limbs.  Vision: She has mild reduced color saturation out of the right eye and notes mild blurriness at times. No diplopia.    Bladder:   She has occasional urinary frequency or hesitancy. No incontinence.   No hesitancy  Fatigue/sleep: She notes fatigue that gets worse as the day goes on. This is both physical and  cognitive. She has gained some weight even though she is trying to stay active. Insomnia is a little worse (both sleep onset and maintenance).        Mood/cognition:  She denies any depression or anxiety. No major cognitive dysfunction but does have a little bit of issues at times. Mostly she finds that she has decreased focus and attention. In the past, she was diagnosed with ADD. Vyvanse was not well tolerated.    Migraines: She is having migraines about 7-8 days a month. Typically migraine lasts 2 days. There occur randomly or associated with her cycle.   When present, the pain is usually over the left eye and is constant, more than throbbing. When severe it is bilateral.   She will sometimes have a mild visual aura before the migraine. When present, she will have photophobia and phonophobia. She will get nausea and occasionally will get vomiting. Moving will make the headaches worse.  Sometimes being touch in the head will trigger a migraine. Topamax at low dose did not help much.   A higher dose is being tried d much.    Triptan such as Imitrex and Maxalt have not really helped reduce the headache pain. Fioricet had not helped in the past. Midrin and Hydrocodone or indomethacin often help some.       MS History:   She was diagnosed with multiple sclerosis in 2010 after presenting with a left foot drop. MRI of the brain was consistent with multiple sclerosis.   She was  started on Rebif. The MS was well controlled but she felt poorly on that medication. She transferred to [facility] and began to see me in 2012. She was placed on Tysabri for about a year and felt much better while on it. Her MS did well on Tysabri as well.  Unfortunately, she was JCV positive and after about a year she stopped Tysabri and started Gilenya. She has done fairly well on Gilenya with no definite exacerbations.      REVIEW OF SYSTEMS: Constitutional: No fevers, chills, sweats, or change in appetite.  She notes  fatigue and insomnia. Eyes: No visual changes, double vision, eye pain Ear, nose and throat: No hearing loss, ear pain, nasal congestion, sore throat Cardiovascular: No chest pain, palpitations Respiratory: No shortness of breath at rest or with exertion.   No wheezes GastrointestinaI: No nausea, vomiting, diarrhea, abdominal pain, fecal incontinence Genitourinary: No dysuria, urinary retention or frequency.  No nocturia. Musculoskeletal: No neck pain, back .  Notes myalgias at times Integumentary: No rash, pruritus, skin lesions Neurological: as above Psychiatric: No depression at this time.  No anxiety Endocrine: No palpitations, diaphoresis, change in appetite, change in weigh or increased thirst Hematologic/Lymphatic: No anemia, purpura, petechiae. Allergic/Immunologic: No itchy/runny eyes, nasal congestion, recent allergic reactions, rashes.  She has some food allergies.  ALLERGIES: Allergies  Allergen Reactions   Amoxicillin Hives    Has patient had a PCN reaction causing immediate rash, facial/tongue/throat swelling, SOB or lightheadedness with hypotension: Yes Has patient had a PCN reaction causing severe rash involving mucus membranes or skin necrosis: Yes Has patient had a PCN reaction that required hospitalization No Has patient had a PCN reaction occurring within the last 10 years: No If all of the above answers are "NO", then may proceed with Cephalosporin use.    Milk-Related Compounds Diarrhea and Nausea And Vomiting    Stomach cramps    HOME MEDICATIONS:  Current Outpatient Medications:    amphetamine-dextroamphetamine (ADDERALL XR) 20 MG 24 hr capsule, Take 1 capsule (20 mg total) by mouth daily., Disp: 30 capsule, Rfl: 0   gabapentin (NEURONTIN) 300 MG capsule, TAKE 1 CAPSULE IN AM, AND 1 CAP  IN EVENING, AND 2 AT BEDTIME, Disp: 360 capsule, Rfl: 3   levocetirizine (XYZAL) 5 MG tablet, TAKE 1 TABLET BY MOUTH EVERY DAY EVERY EVENING, Disp: 90 tablet, Rfl:  1   Liraglutide -Weight Management (SAXENDA) 18 MG/3ML SOPN, Inject 3 mg into the skin daily., Disp: 5 pen, Rfl: 1   zonisamide (ZONEGRAN) 100 MG capsule, Take 1 capsule (100 mg total) by mouth daily., Disp: 30 capsule, Rfl: 5  PAST MEDICAL HISTORY: Past Medical History:  Diagnosis Date   Allergy    Anemia    HEAVY PERIODS   Headache(784.0)    MIGRAINES   Multiple sclerosis ([place])    Neuromuscular disorder ([place])    MS   Pulmonary embolism ([place]) 2013   lt-?bcp   Wears glasses     PAST SURGICAL HISTORY: Past Surgical History:  Procedure Laterality Date   BILATERAL SALPINGECTOMY Bilateral 12/27/2015   Procedure: BILATERAL SALPINGECTOMY;  Surgeon: Everett Graff, MD;  Location: [facility];  Service: Gynecology;  Laterality: Bilateral;  Excision of paratubal cyst   BREAST LUMPECTOMY WITH RADIOACTIVE SEED LOCALIZATION Left 10/06/2013   Procedure:  RADIOACTIVE SEED LOCALIZATION LEFT BREAST LUMPECTOMY;  Surgeon: Odis Hollingshead, MD;  Location: [facility];  Service: General;  Laterality: Left;   BREAST REDUCTION SURGERY     CYSTOSCOPY N/A 12/27/2015   Procedure: CYSTOSCOPY;  Surgeon: Everett Graff, MD;  Location: [facility];  Service: Gynecology;  Laterality: N/A;   REPAIR VAGINAL CUFF N/A 01/09/2016   Procedure: REPAIR VAGINAL CUFF;  Surgeon: Everett Graff, MD;  Location: [facility];  Service: Gynecology;  Laterality: N/A;   VAGINAL HYSTERECTOMY Bilateral 12/27/2015   Procedure: HYSTERECTOMY VAGINAL;  Surgeon: Everett Graff, MD;  Location: [facility];  Service: Gynecology;  Laterality: Bilateral;   WISDOM TOOTH EXTRACTION      FAMILY HISTORY: Family History  Problem Relation Age of Onset   Asthma Mother    Hypertension Father    Cancer Paternal Uncle    Cancer Paternal Grandfather     SOCIAL HISTORY:  Social History   Socioeconomic History   Marital status: Married    Spouse name: Not on file   Number of children: Not on file   Years of education:  Not on file   Highest education level: Not on file  Occupational History   Not on file  Social Needs   Financial resource strain: Not on file   Food insecurity    Worry: Not on file    Inability: Not on file   Transportation needs    Medical: Not on file    Non-medical: Not on file  Tobacco Use   Smoking status: Never Smoker   Smokeless tobacco: Never Used  Substance and Sexual Activity   Alcohol use: Yes    Alcohol/week: 0.0 standard drinks    Comment: occ   Drug use: No   Sexual activity: Yes    Birth control/protection: None    Comment: VASECTOMY  Lifestyle   Physical activity    Days per week: Not on file    Minutes per session: Not on file   Stress: Not on file  Relationships   Social connections    Talks on phone: Not on file    Gets together: Not on file    Attends religious service: Not on file    Active member of club or organization: Not on file    Attends meetings of clubs or organizations: Not on file    Relationship status: Not on file  Intimate partner violence    Fear of current or ex partner: Not on file    Emotionally abused: Not on file    Physically abused: Not on file    Forced sexual activity: Not on file  Other Topics Concern   Not on file  Social History Narrative   Right handed   Caffeine use: sometimes     PHYSICAL EXAM  Vitals:   09/02/18 1519  BP: 105/66  Pulse: 74  Temp: 98.2 F (36.8 C)  Weight: 144 lb (65.3 kg)  Height: 5' 3.4" (1.61 m)    Body mass index is 25.19 kg/m.   General: The patient is well-developed and well-nourished and in no acute distress   Musculoskeletal:  Neck has good ROM.    Neurologic Exam  Mental status: The patient is alert and oriented x 3 at the time of the examination. The patient has apparent normal recent and remote memory, with an apparently normal attention span and concentration ability.   Speech is normal.  Cranial nerves: Extraocular movements are full.  Facial  strength is normal.  Trapezius strength was normal.. No obvious hearing deficits are noted.  Motor:  Muscle bulk is normal.   Tone is normal. Strength is  5 / 5 in all 4 extremities except 4+ in the left toe extensors  Sensory: She has intact sensation to touch and vibration in the arms and legs.  Coordination: Cerebellar testing reveals good finger-nose-finger and heel-to-shin bilaterally.  Gait and station: Station is normal.   Gait is normal. The tandem gait is mildly wide. Romberg is negative.   Reflexes: Deep tendon reflexes are symmetric and normal bilaterally.        DIAGNOSTIC DATA (LABS, IMAGING, TESTING) - I reviewed patient records, labs, notes, testing and imaging myself where available.  Lab Results  Component Value Date   WBC 2.3 (LL) 08/11/2018   HGB 13.1 08/11/2018   HCT 37.5 08/11/2018   MCV 94 08/11/2018   PLT 329 08/11/2018        ASSESSMENT AND PLAN    1. Multiple sclerosis ([place])   2. High risk medication use   3. Chronic migraine   4. Convulsions, unspecified convulsion type ([place])   5. Dysesthesia   6. Insomnia, unspecified type      1.   We discussed switching to Ocrevus as she has had exacerbations on Gilenya   2.   Recheck CBC 3.   If insomnia does not improve, add temazepam. 4.  Continue gabapentin for the dysesthesias. 5.   She will return in 6 months or sooner if there are new or worsening neurologic symptoms.   Kasheem Toner A. Felecia Shelling, MD, PhD 5/57/3220, 2:54 PM Certified in Neurology, Clinical Neurophysiology, Sleep Medicine, Pain Medicine and Neuroimaging  [facility] [address], [place],  [address] [phone]

## 2018-09-03 ENCOUNTER — Telehealth: Payer: Self-pay | Admitting: *Deleted

## 2018-09-03 LAB — CBC WITH DIFFERENTIAL/PLATELET
Basophils Absolute: 0 10*3/uL (ref 0.0–0.2)
Basos: 1 %
EOS (ABSOLUTE): 0.1 10*3/uL (ref 0.0–0.4)
Eos: 2 %
Hematocrit: 35.4 % (ref 34.0–46.6)
Hemoglobin: 12 g/dL (ref 11.1–15.9)
Immature Grans (Abs): 0 10*3/uL (ref 0.0–0.1)
Immature Granulocytes: 0 %
Lymphocytes Absolute: 0.5 10*3/uL — ABNORMAL LOW (ref 0.7–3.1)
Lymphs: 16 %
MCH: 32 pg (ref 26.6–33.0)
MCHC: 33.9 g/dL (ref 31.5–35.7)
MCV: 94 fL (ref 79–97)
Monocytes Absolute: 0.3 10*3/uL (ref 0.1–0.9)
Monocytes: 8 %
Neutrophils Absolute: 2.5 10*3/uL (ref 1.4–7.0)
Neutrophils: 73 %
Platelets: 281 10*3/uL (ref 150–450)
RBC: 3.75 x10E6/uL — ABNORMAL LOW (ref 3.77–5.28)
RDW: 12.7 % (ref 11.7–15.4)
WBC: 3.4 10*3/uL (ref 3.4–10.8)

## 2018-09-03 NOTE — Telephone Encounter (Signed)
Received fax notification from [facility] that [employer] approved 09/03/18-09/02/21. PA# [place] [phone] Non-grandfathered [id number]

## 2018-09-03 NOTE — Telephone Encounter (Signed)
Initiated PA adderall on CMM. Key: [id number]. Waiting on next steps from [facility].

## 2018-09-03 NOTE — Telephone Encounter (Signed)
Submitted PA. Waiting on determination. °

## 2018-09-08 ENCOUNTER — Telehealth: Payer: Self-pay | Admitting: *Deleted

## 2018-09-08 NOTE — Telephone Encounter (Signed)
Called, pt already spoke with pt. She is scheduled for Ocrevus 09/23/18 and 10/08/18

## 2018-09-08 NOTE — Telephone Encounter (Signed)
-----   Message from Britt Bottom, MD sent at 09/08/2018  4:50 PM EDT ----- Please let the patient know that the white blood cell count looks better... I am going to see if we can move her Ocrevus infusions up by 2 weeks ([facility] will call her to schedule)

## 2018-10-01 LAB — HM MAMMOGRAPHY: HM Mammogram: NORMAL (ref 0–4)

## 2018-10-07 ENCOUNTER — Encounter: Payer: Self-pay | Admitting: Nurse Practitioner

## 2018-10-08 ENCOUNTER — Telehealth: Payer: Self-pay | Admitting: Neurology

## 2018-10-08 NOTE — Telephone Encounter (Signed)
Kelly Alvarado had the first half of the split dose of Ocrevus 2 weeks ago.  During the infusion she felt a stopped up sensation in her ears.  These sensations have persisted.  She was here 10/07/2018 for the second half of the infusion and I discussed this with her and also check her hearing (symmetric) and ear canals (no significant cerumen, tympanic membranes intact).  I do not have a good explanation for her symptoms.  Perhaps she has eustachian tube dysfunction.  If symptoms persist we will evaluate with imaging or referral to ENT.

## 2018-10-13 ENCOUNTER — Emergency Department (HOSPITAL_COMMUNITY)
Admission: EM | Admit: 2018-10-13 | Discharge: 2018-10-13 | Disposition: A | Discharge status: Home / Self Care | Payer: BC Managed Care – PPO | Attending: Emergency Medicine | Admitting: Emergency Medicine

## 2018-10-13 ENCOUNTER — Other Ambulatory Visit: Payer: Self-pay

## 2018-10-13 DIAGNOSIS — R55 Syncope and collapse: Secondary | ICD-10-CM | POA: Insufficient documentation

## 2018-10-13 DIAGNOSIS — R569 Unspecified convulsions: Secondary | ICD-10-CM | POA: Diagnosis not present

## 2018-10-13 DIAGNOSIS — G35 Multiple sclerosis: Secondary | ICD-10-CM | POA: Insufficient documentation

## 2018-10-13 DIAGNOSIS — Z79899 Other long term (current) drug therapy: Secondary | ICD-10-CM | POA: Diagnosis not present

## 2018-10-13 LAB — BASIC METABOLIC PANEL
Anion gap: 9 (ref 5–15)
BUN: 11 mg/dL (ref 6–20)
CO2: 23 mmol/L (ref 22–32)
Calcium: 9.3 mg/dL (ref 8.9–10.3)
Chloride: 106 mmol/L (ref 98–111)
Creatinine, Ser: 1.08 mg/dL — ABNORMAL HIGH (ref 0.44–1.00)
GFR calc Af Amer: >60 mL/min (ref >60)
GFR calc non Af Amer: >60 mL/min (ref >60)
Glucose, Bld: 80 mg/dL (ref 70–99)
Potassium: 3.9 mmol/L (ref 3.5–5.1)
Sodium: 138 mmol/L (ref 135–145)

## 2018-10-13 LAB — URINALYSIS, ROUTINE W REFLEX MICROSCOPIC
Bilirubin Urine: NEGATIVE
Glucose, UA: NEGATIVE mg/dL
Hgb urine dipstick: NEGATIVE
Ketones, ur: NEGATIVE mg/dL
Leukocytes,Ua: NEGATIVE
Nitrite: NEGATIVE
Protein, ur: NEGATIVE mg/dL
Specific Gravity, Urine: 1.011 (ref 1.005–1.030)
pH: 6 (ref 5.0–8.0)

## 2018-10-13 LAB — CBC
HCT: 34.6 % — ABNORMAL LOW (ref 36.0–46.0)
Hemoglobin: 11.6 g/dL — ABNORMAL LOW (ref 12.0–15.0)
MCH: 32.3 pg (ref 26.0–34.0)
MCHC: 33.5 g/dL (ref 30.0–36.0)
MCV: 96.4 fL (ref 80.0–100.0)
Platelets: 317 10*3/uL (ref 150–400)
RBC: 3.59 MIL/uL — ABNORMAL LOW (ref 3.87–5.11)
RDW: 12.4 % (ref 11.5–15.5)
WBC: 5 10*3/uL (ref 4.0–10.5)
nRBC: 0 % (ref 0.0–0.2)

## 2018-10-13 MED ORDER — SODIUM CHLORIDE 0.9% FLUSH: 3.0000 mL | Freq: Once | INTRAVENOUS | Status: DC

## 2018-10-13 NOTE — ED Provider Notes (Signed)
[facility] DE[facility] Provider Note   CSN: [id number] Arrival date & time: 10/13/18  1750     History   Chief Complaint Chief Complaint  Patient presents with  . Loss of Consciousness    HPI CASHAY BINGMAN is a 43 y.o. female with a past medical history of MS who presents to the emergency department with a syncopal episode.     The history is provided by the patient and the spouse.  Loss of Consciousness Episode history:  Single Most recent episode:  Today Duration:  2 minutes Progression:  Resolved Context comment:  Poor PO intake, walking outside in heat Witnessed: yes   Relieved by:  Lying down Worsened by:  Nothing Ineffective treatments:  None tried Associated symptoms: no chest pain, no confusion, no diaphoresis, no difficulty breathing, no fever, no focal weakness, no headaches, no nausea, no palpitations, no recent fall, no seizures, no shortness of breath, no visual change, no vomiting and no weakness   Associated symptoms comment:  Abdominal pain, feeling need to use restroom Risk factors: no coronary artery disease and no seizures     Past Medical History:  Diagnosis Date  . Allergy   . Anemia    HEAVY PERIODS  . Headache(784.0)    MIGRAINES  . Multiple sclerosis ([place])   . Neuromuscular disorder ([place])    MS  . Pulmonary embolism ([place]) 2013   lt-?bcp  . Wears glasses     Patient Active Problem List   Diagnosis Date Noted  . Convulsions ([place]) 02/29/2016  . Menorrhagia 12/27/2015  . Chronic migraine 09/02/2015  . Dysesthesia 09/02/2015  . Obesity 09/02/2015  . Common migraine without intractability 05/06/2014  . High risk medication use 05/06/2014  . Visual disturbance 05/06/2014  . Other fatigue 05/06/2014  . Insomnia 05/06/2014  . Myalgia and myositis 05/06/2014  . 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 Pulmonary embolism ([place]) 07/17/2011  . Multiple sclerosis ([place]) 07/17/2011    Past Surgical  History:  Procedure Laterality Date  . BILATERAL SALPINGECTOMY Bilateral 12/27/2015   Procedure: BILATERAL SALPINGECTOMY;  Surgeon: Everett Graff, MD;  Location: [facility];  Service: Gynecology;  Laterality: Bilateral;  Excision of paratubal cyst  . BREAST LUMPECTOMY WITH RADIOACTIVE SEED LOCALIZATION Left 10/06/2013   Procedure:  RADIOACTIVE SEED LOCALIZATION LEFT BREAST LUMPECTOMY;  Surgeon: Odis Hollingshead, MD;  Location: [facility];  Service: General;  Laterality: Left;  . BREAST REDUCTION SURGERY    . CYSTOSCOPY N/A 12/27/2015   Procedure: CYSTOSCOPY;  Surgeon: Everett Graff, MD;  Location: [facility];  Service: Gynecology;  Laterality: N/A;  . REPAIR VAGINAL CUFF N/A 01/09/2016   Procedure: REPAIR VAGINAL CUFF;  Surgeon: Everett Graff, MD;  Location: [facility];  Service: Gynecology;  Laterality: N/A;  . VAGINAL HYSTERECTOMY Bilateral 12/27/2015   Procedure: HYSTERECTOMY VAGINAL;  Surgeon: Everett Graff, MD;  Location: [facility];  Service: Gynecology;  Laterality: Bilateral;  . WISDOM TOOTH EXTRACTION       OB History    Gravida  5   Para  3   Term      Preterm      AB      Living  3     SAB      TAB      Ectopic      Multiple      Live Births               Home Medications    Prior to  Admission medications   Medication Sig Start Date End Date Taking? Authorizing Provider  amphetamine-dextroamphetamine (ADDERALL XR) 25 MG 24 hr capsule Take 1 capsule by mouth daily. 09/02/18   Sater, Nanine Means, MD  gabapentin (NEURONTIN) 300 MG capsule TAKE 1 CAPSULE IN AM, AND 1 CAP IN EVENING, AND 2 AT BEDTIME 06/04/18   Sater, Nanine Means, MD  levocetirizine (XYZAL) 5 MG tablet TAKE 1 TABLET BY MOUTH EVERY DAY EVERY EVENING 02/10/18   [name], MD  Liraglutide -Weight Management (SAXENDA) 18 MG/3ML SOPN Inject 3 mg into the skin daily. 08/27/18   [name], FNP  zonisamide (ZONEGRAN) 100 MG capsule Take 1 capsule (100 mg total) by mouth daily. 07/03/18   Sater,  Nanine Means, MD    Family History Family History  Problem Relation Age of Onset  . Asthma Mother   . Hypertension Father   . Cancer Paternal Uncle   . Cancer Paternal Grandfather     Social History Social History   Tobacco Use  . Smoking status: Never Smoker  . Smokeless tobacco: Never Used  Substance Use Topics  . Alcohol use: Yes    Alcohol/week: 0.0 standard drinks    Comment: occ  . Drug use: No     Allergies   Amoxicillin and Milk-related compounds   Review of Systems Review of Systems  Constitutional: Negative for diaphoresis and fever.  HENT: Positive for ear pain. Negative for congestion and trouble swallowing.   Eyes: Negative for visual disturbance.  Respiratory: Negative for shortness of breath.   Cardiovascular: Positive for syncope. Negative for chest pain and palpitations.  Gastrointestinal: Positive for abdominal pain and constipation. Negative for blood in stool, diarrhea, nausea and vomiting.  Genitourinary: Negative for dysuria.  Musculoskeletal: Negative for gait problem.  Skin: Negative for rash.  Neurological: Positive for syncope and light-headedness. Negative for focal weakness, seizures, facial asymmetry, speech difficulty, weakness and headaches.  Psychiatric/Behavioral: Negative for confusion.     Physical Exam Updated Vital Signs BP 122/76   Pulse 66   Temp 99 F (37.2 C) (Oral)   Resp 16   LMP 12/11/2015 (Approximate)   SpO2 100%   Physical Exam Constitutional:      General: She is not in acute distress.    Appearance: She is not diaphoretic.  HENT:     Head: Normocephalic and atraumatic.     Right Ear: Tympanic membrane and external ear normal.     Left Ear: Tympanic membrane and external ear normal.     Nose: Nose normal.     Mouth/Throat:     Mouth: Mucous membranes are moist.     Pharynx: Oropharynx is clear.  Eyes:     Pupils: Pupils are equal, round, and reactive to light.  Neck:     Musculoskeletal: Neck supple.   Cardiovascular:     Rate and Rhythm: Normal rate and regular rhythm.     Pulses: Normal pulses.  Pulmonary:     Effort: Pulmonary effort is normal. No respiratory distress.     Breath sounds: Normal breath sounds. No wheezing, rhonchi or rales.  Chest:     Chest wall: No tenderness.  Abdominal:     Palpations: Abdomen is soft.     Tenderness: There is no abdominal tenderness. There is no guarding or rebound.  Musculoskeletal:     Right lower leg: No edema.     Left lower leg: No edema.  Skin:    General: Skin is warm and dry.  Neurological:  General: No focal deficit present.     Mental Status: She is alert and oriented to person, place, and time. Mental status is at baseline.     Cranial Nerves: No cranial nerve deficit.     Sensory: No sensory deficit.     Motor: No weakness.     Coordination: Coordination normal.      ED Treatments / Results  Labs (all labs ordered are listed, but only abnormal results are displayed) Labs Reviewed  BASIC METABOLIC PANEL - Abnormal; Notable for the following components:      Result Value   Creatinine, Ser 1.08 (*)    All other components within normal limits  CBC - Abnormal; Notable for the following components:   RBC 3.59 (*)    Hemoglobin 11.6 (*)    HCT 34.6 (*)    All other components within normal limits  URINALYSIS, ROUTINE W REFLEX MICROSCOPIC - Abnormal; Notable for the following components:   APPearance HAZY (*)    All other components within normal limits    EKG EKG Interpretation  Date/Time:  Monday October 13 2018 20:50:08 EDT Ventricular Rate:  63 PR Interval:    QRS Duration: 98 QT Interval:  395 QTC Calculation: 405 R Axis:   37 Text Interpretation:  Sinus rhythm No significant change since last tracing Confirmed by Gareth Morgan [phone]) on 10/13/2018 8:54:13 PM   Radiology No results found.  Procedures Procedures (including critical care time)  Medications Ordered in ED Medications - No data to  display   Initial Impression / Assessment and Plan / ED Course  I have reviewed the triage vital signs and the nursing notes.  Pertinent labs & imaging results that were available during my care of the patient were reviewed by me and considered in my medical decision making (see chart for details).        Concern for syncopal episode. Patient does not have cardiac risk factors, had proceeding lightheadedness with no chest pain or shortness of breath. EKG unrevealing. Episode sounds vagal with abdominal pain and feeling need to use restroom immediately after, abdominal pain quickly self resolved, not currently having pain. Hx of hysterectomy, ectopic pregnancy not current concern. Low suspicion for seizure, event was witnessed by husband, no shaking or post-ictal state noted. Labs un-revealing. All questions answered and strict return precautions given. Patient and family comfortable with plan to discharge home and continue supportive care.  Patient seen and plan discussed with Dr. Billy Fischer.  Final Clinical Impressions(s) / ED Diagnoses   Final diagnoses:  Syncope, unspecified syncope type    ED Discharge Orders    None       Betsey Amen, MD 10/14/18 [id number]    Gareth Morgan, MD 10/15/18 [id number]

## 2018-10-13 NOTE — ED Triage Notes (Signed)
Pt sts she was outside, felt dizzy, and had a syncopal episode. Only injury was scrape on ankle. Family sts for 2-3 mins pt was unresponsive. Pt has hx of MS and called her neurologist who told her to come here for further testing. Pt recently started new infusion for MS. A/O x 4 in triage.

## 2018-10-14 ENCOUNTER — Other Ambulatory Visit: Payer: Self-pay | Admitting: Internal Medicine

## 2018-10-27 ENCOUNTER — Encounter: Payer: Self-pay | Admitting: Nurse Practitioner

## 2018-10-27 ENCOUNTER — Ambulatory Visit: Payer: BC Managed Care – PPO | Admitting: Nurse Practitioner

## 2018-10-27 ENCOUNTER — Other Ambulatory Visit: Payer: Self-pay

## 2018-10-27 VITALS — BP 110/80 | HR 64 | Temp 97.8°F | Ht 64.2 in | Wt 145.4 lb

## 2018-10-27 DIAGNOSIS — R55 Syncope and collapse: Secondary | ICD-10-CM

## 2018-10-27 DIAGNOSIS — E559 Vitamin D deficiency, unspecified: Secondary | ICD-10-CM

## 2018-10-27 DIAGNOSIS — G35 Multiple sclerosis: Secondary | ICD-10-CM

## 2018-10-27 DIAGNOSIS — E663 Overweight: Secondary | ICD-10-CM | POA: Diagnosis not present

## 2018-10-27 DIAGNOSIS — L819 Disorder of pigmentation, unspecified: Secondary | ICD-10-CM | POA: Diagnosis not present

## 2018-10-27 MED ORDER — VITAMIN D (ERGOCALCIFEROL) 1.25 MG (50000 UNIT) PO CAPS: 50000.0000 [IU] | ORAL_CAPSULE | ORAL | 0 refills | Status: DC

## 2018-10-27 MED ORDER — SAXENDA 18 MG/3ML ~~LOC~~ SOPN: 3.0000 mg | PEN_INJECTOR | Freq: Every day | SUBCUTANEOUS | 1 refills | Status: DC

## 2018-10-27 NOTE — Progress Notes (Signed)
Subjective:     Patient ID: Kelly Alvarado , female    DOB: 08-08-1975 , 43 y.o.   MRN: [id number]   Chief Complaint  Patient presents with  . Weight Check    HPI  She is here today for weight check however she was in the ER on Labor Day due to blacking out.  She told her family she felt dizzy and she remembers her family being in front of her.  She called the Neurologist and told if not better in 30 minutes to go to the ER.  She does not recall doing anything specifically while outside.  She felt dizzy and fell to the ground. She had eaten.  She reports being well hydrated. She does recall feeling "weird earlier that day".  She had started a new medication for MS called Ocrevis.     Wt Readings from Last 3 Encounters: 10/27/18 : 145 lb 6.4 oz (66 kg) 09/02/18 : 144 lb (65.3 kg) 08/27/18 : 144 lb (65.3 kg)     Past Medical History:  Diagnosis Date  . Allergy   . Anemia    HEAVY PERIODS  . Headache(784.0)    MIGRAINES  . Multiple sclerosis ([place])   . Neuromuscular disorder ([place])    MS  . Pulmonary embolism ([place]) 2013   lt-?bcp  . Wears glasses      Family History  Problem Relation Age of Onset  . Asthma Mother   . Hypertension Father   . Cancer Paternal Uncle   . Cancer Paternal Grandfather      Current Outpatient Medications:  .  amphetamine-dextroamphetamine (ADDERALL XR) 25 MG 24 hr capsule, Take 1 capsule by mouth daily., Disp: 30 capsule, Rfl: 0 .  gabapentin (NEURONTIN) 300 MG capsule, TAKE 1 CAPSULE IN AM, AND 1 CAP IN EVENING, AND 2 AT BEDTIME, Disp: 360 capsule, Rfl: 3 .  levocetirizine (XYZAL) 5 MG tablet, TAKE 1 TABLET BY MOUTH EVERY DAY EVERY EVENING, Disp: 90 tablet, Rfl: 1 .  Liraglutide -Weight Management (SAXENDA) 18 MG/3ML SOPN, Inject 3 mg into the skin daily., Disp: 5 pen, Rfl: 1 .  zonisamide (ZONEGRAN) 100 MG capsule, Take 1 capsule (100 mg total) by mouth daily., Disp: 30 capsule, Rfl: 5   Allergies  Allergen Reactions  . Amoxicillin Hives   Has patient had a PCN reaction causing immediate rash, facial/tongue/throat swelling, SOB or lightheadedness with hypotension: Yes Has patient had a PCN reaction causing severe rash involving mucus membranes or skin necrosis: Yes Has patient had a PCN reaction that required hospitalization No Has patient had a PCN reaction occurring within the last 10 years: No If all of the above answers are "NO", then may proceed with Cephalosporin use.   . Milk-Related Compounds Diarrhea and Nausea And Vomiting    Stomach cramps     Review of Systems  Constitutional: Negative.   Respiratory: Negative.   Cardiovascular: Negative.  Negative for chest pain, palpitations and leg swelling.  Endocrine: Negative for polydipsia, polyphagia and polyuria.  Neurological: Negative for dizziness and headaches.     Today's Vitals   10/27/18 0857  BP: 110/80  Pulse: 64  Temp: 97.8 F (36.6 C)  TempSrc: Oral  Weight: 145 lb 6.4 oz (66 kg)  Height: 5' 4.2" (1.631 m)  PainSc: 0-No pain   Body mass index is 24.8 kg/m.   Objective:  Physical Exam Constitutional:      Appearance: Normal appearance.  Cardiovascular:     Rate and Rhythm: Normal rate  and regular rhythm.     Pulses: Normal pulses.     Heart sounds: Normal heart sounds.  Pulmonary:     Effort: Pulmonary effort is normal.     Breath sounds: Normal breath sounds.  Skin:    Capillary Refill: Capillary refill takes less than 2 seconds.  Neurological:     General: No focal deficit present.     Mental Status: She is alert and oriented to person, place, and time.  Psychiatric:        Mood and Affect: Mood normal.        Behavior: Behavior normal.        Thought Content: Thought content normal.        Judgment: Judgment normal.         Assessment And Plan:      1. Vitamin D deficiency  She has not been on Vitamin d in quite some time but has a history of MS  Will check levels and sent a Rx for vitamin d 50,000 units once per week if  normal will make adjustments - Vitamin D (25 hydroxy) - Vitamin D, Ergocalciferol, (DRISDOL) 1.25 MG (50000 UT) CAPS capsule; Take 1 capsule (50,000 Units total) by mouth every 7 (seven) days.  Dispense: 12 capsule; Refill: 0  2. Overweight (BMI 25.0-29.9)  Chronic  Stable on Saxenda continue at 3 mg per day  Continue with regular exercise.  - Liraglutide -Weight Management (SAXENDA) 18 MG/3ML SOPN; Inject 3 mg into the skin daily.  Dispense: 5 pen; Refill: 1  3. Syncope, unspecified syncope type  Episode of syncope on labor day thought to be vasovagal  I have advised her to contact her neurologist as she did have a new MS medication a week prior to this event  She is advised if occurs again will likely need to do additional testing her EKG in ER was normal and labs were normal  4. Discoloration of skin of toe  Slightly darkened toenail to right 2nd toenail appears to be growing out.   May be related to walking more and irritation to nailbed.   If worsens return call to office, feet have good color and normal temperature.   [name], FNP    THE PATIENT IS ENCOURAGED TO PRACTICE SOCIAL DISTANCING DUE TO THE COVID-19 PANDEMIC.

## 2018-10-28 ENCOUNTER — Other Ambulatory Visit: Payer: Self-pay | Admitting: Nurse Practitioner

## 2018-10-28 ENCOUNTER — Telehealth: Payer: Self-pay

## 2018-10-28 ENCOUNTER — Ambulatory Visit: Payer: BC Managed Care – PPO | Admitting: Nurse Practitioner

## 2018-10-28 DIAGNOSIS — E559 Vitamin D deficiency, unspecified: Secondary | ICD-10-CM

## 2018-10-28 LAB — VITAMIN D 25 HYDROXY (VIT D DEFICIENCY, FRACTURES): Vit D, 25-Hydroxy: 18.1 ng/mL — ABNORMAL LOW (ref 30.0–100.0)

## 2018-10-28 MED ORDER — VITAMIN D (ERGOCALCIFEROL) 1.25 MG (50000 UNIT) PO CAPS: 50000.0000 [IU] | ORAL_CAPSULE | ORAL | 1 refills | Status: DC

## 2018-10-28 NOTE — Telephone Encounter (Signed)
Patient's PA for saxenda has been completed on covermymeds we are just waiting to see if it was approved or denied. YRL,RMA

## 2018-10-29 ENCOUNTER — Telehealth: Payer: Self-pay

## 2018-10-29 NOTE — Telephone Encounter (Signed)
pharmacy has been notified that her PA for saxenda has been approved through 10/28/2018-10/28/2019. I called patient and left her a v/m. YRL,RMA

## 2018-10-30 ENCOUNTER — Other Ambulatory Visit: Payer: Self-pay | Admitting: Nurse Practitioner

## 2018-12-30 ENCOUNTER — Other Ambulatory Visit: Payer: Self-pay

## 2018-12-30 ENCOUNTER — Ambulatory Visit: Payer: BC Managed Care – PPO | Admitting: Nurse Practitioner

## 2018-12-30 ENCOUNTER — Encounter: Payer: Self-pay | Admitting: Nurse Practitioner

## 2018-12-30 VITALS — BP 110/70 | HR 77 | Temp 97.7°F | Ht 64.4 in | Wt 154.0 lb

## 2018-12-30 DIAGNOSIS — N390 Urinary tract infection, site not specified: Secondary | ICD-10-CM

## 2018-12-30 DIAGNOSIS — E663 Overweight: Secondary | ICD-10-CM

## 2018-12-30 DIAGNOSIS — R238 Other skin changes: Secondary | ICD-10-CM

## 2018-12-30 LAB — POCT URINALYSIS DIPSTICK
Bilirubin, UA: NEGATIVE
Blood, UA: NEGATIVE
Glucose, UA: NEGATIVE
Ketones, UA: NEGATIVE
Nitrite, UA: POSITIVE
Protein, UA: NEGATIVE
Spec Grav, UA: 1.015 (ref 1.010–1.025)
Urobilinogen, UA: 0.2 E.U./dL
pH, UA: 6.5 (ref 5.0–8.0)

## 2018-12-30 MED ORDER — CIPROFLOXACIN HCL 500 MG PO TABS: 500.0000 mg | ORAL_TABLET | Freq: Two times a day (BID) | ORAL | 0 refills | Status: AC

## 2018-12-30 MED ORDER — EUCRISA 2 % EX OINT: 1.0000 | TOPICAL_OINTMENT | Freq: Two times a day (BID) | CUTANEOUS | 3 refills | Status: DC

## 2018-12-30 NOTE — Patient Instructions (Addendum)
   Take Magnesium 200-250 mg daily with evening meal  Voltaren gel for knee and back pain.

## 2018-12-30 NOTE — Progress Notes (Signed)
Subjective:     Patient ID: Kelly Alvarado , female    DOB: [birth year] , 43 y.o.   MRN: [id number]   Chief Complaint  Patient presents with  . Weight Check  . Dysuria    patient has been urinating more, back pain and her urine has an odor to it.    HPI  She has ran out of Saxenda at least 2 weeks ago.   Wt Readings from Last 3 Encounters: 12/30/18 : 154 lb (69.9 kg) 10/27/18 : 145 lb 6.4 oz (66 kg) 09/02/18 : 144 lb (65.3 kg)   Dysuria  This is a new problem. The current episode started 1 to 4 weeks ago (2 weeks ago). The quality of the pain is described as aching. The pain is at a severity of 5/10. The pain is moderate. There has been no fever. Pertinent negatives include no chills, flank pain, hesitancy or urgency. She has tried nothing for the symptoms. There is no history of catheterization.     Past Medical History:  Diagnosis Date  . Allergy   . Anemia    HEAVY PERIODS  . Headache(784.0)    MIGRAINES  . Multiple sclerosis ([place])   . Neuromuscular disorder ([place])    MS  . Pulmonary embolism ([place]) 2013   lt-?bcp  . Wears glasses      Family History  Problem Relation Age of Onset  . Asthma Mother   . Hypertension Father   . Cancer Paternal Uncle   . Cancer Paternal Grandfather      Current Outpatient Medications:  .  amphetamine-dextroamphetamine (ADDERALL XR) 25 MG 24 hr capsule, Take 1 capsule by mouth daily., Disp: 30 capsule, Rfl: 0 .  BD PEN NEEDLE NANO U/F 32G X 4 MM MISC, USE AS DIRECTED, Disp: 30 each, Rfl: 5 .  gabapentin (NEURONTIN) 300 MG capsule, TAKE 1 CAPSULE IN AM, AND 1 CAP IN EVENING, AND 2 AT BEDTIME, Disp: 360 capsule, Rfl: 3 .  levocetirizine (XYZAL) 5 MG tablet, TAKE 1 TABLET BY MOUTH EVERY DAY EVERY EVENING, Disp: 90 tablet, Rfl: 1 .  Liraglutide -Weight Management (SAXENDA) 18 MG/3ML SOPN, Inject 3 mg into the skin daily., Disp: 5 pen, Rfl: 1 .  Vitamin D, Ergocalciferol, (DRISDOL) 1.25 MG (50000 UT) CAPS capsule, Take 1 capsule (50,000  Units total) by mouth 2 (two) times a week., Disp: 24 capsule, Rfl: 1 .  zonisamide (ZONEGRAN) 100 MG capsule, Take 1 capsule (100 mg total) by mouth daily., Disp: 30 capsule, Rfl: 5   Allergies  Allergen Reactions  . Amoxicillin Hives    Has patient had a PCN reaction causing immediate rash, facial/tongue/throat swelling, SOB or lightheadedness with hypotension: Yes Has patient had a PCN reaction causing severe rash involving mucus membranes or skin necrosis: Yes Has patient had a PCN reaction that required hospitalization No Has patient had a PCN reaction occurring within the last 10 years: No If all of the above answers are "NO", then may proceed with Cephalosporin use.   . Milk-Related Compounds Diarrhea and Nausea And Vomiting    Stomach cramps     Review of Systems  Constitutional: Negative.  Negative for chills.  Respiratory: Negative.   Cardiovascular: Negative.  Negative for chest pain, palpitations and leg swelling.  Endocrine: Negative for polydipsia, polyphagia and polyuria.  Genitourinary: Positive for dysuria. Negative for flank pain, hesitancy and urgency.  Musculoskeletal:       She has been having back pain and pain down her left side.  She is on a new medication for MS does not think is working  Neurological: Negative for dizziness and headaches.  Psychiatric/Behavioral: Negative.      Today's Vitals   12/30/18 0902  BP: 110/70  Pulse: 77  Temp: 97.7 F (36.5 C)  TempSrc: Oral  Weight: 154 lb (69.9 kg)  Height: 5' 4.4" (1.636 m)  PainSc: 4   PainLoc: Head   Body mass index is 26.11 kg/m.   Objective:  Physical Exam Constitutional:      General: She is not in acute distress.    Appearance: Normal appearance.  Cardiovascular:     Rate and Rhythm: Normal rate and regular rhythm.     Pulses: Normal pulses.     Heart sounds: Normal heart sounds.  Pulmonary:     Effort: Pulmonary effort is normal. No respiratory distress.     Breath sounds: Normal  breath sounds.  Skin:    Capillary Refill: Capillary refill takes less than 2 seconds.  Neurological:     General: No focal deficit present.     Mental Status: She is alert and oriented to person, place, and time.  Psychiatric:        Mood and Affect: Mood normal.        Behavior: Behavior normal.        Thought Content: Thought content normal.        Judgment: Judgment normal.         Assessment And Plan:      1. Skin irritation  Reported dryness to creases of mouth, will try eucrisa, there is not a rash present.  This may be related to wearing of the mask during the Pandemic  - Crisaborole (EUCRISA) 2 % OINT; Apply 1 each topically 2 (two) times daily.  Dispense: 60 g; Refill: 3 - Culture, Urine  2. Urinary tract infection without hematuria, site unspecified  Nitrates in urine  Will treat with cipro  Urine culture sent pending results will make changes to antibiotic as necessary.  - ciprofloxacin (CIPRO) 500 MG tablet; Take 1 tablet (500 mg total) by mouth 2 (two) times daily for 5 days.  Dispense: 10 tablet; Refill: 0  3. Overweight (BMI 25.0-29.9)  She has been without Saxenda for 3 weeks, unfortunately she has also gained approximately 9 lbs since her last office visit.  Refill sent to pharmacy   When she is feeling better to also increase physical activity        [name], FNP    THE PATIENT IS ENCOURAGED TO PRACTICE SOCIAL DISTANCING DUE TO THE COVID-19 PANDEMIC.

## 2019-01-01 LAB — URINE CULTURE

## 2019-01-06 ENCOUNTER — Encounter: Payer: Self-pay | Admitting: Nurse Practitioner

## 2019-01-06 ENCOUNTER — Other Ambulatory Visit: Payer: Self-pay | Admitting: Nurse Practitioner

## 2019-01-06 DIAGNOSIS — N390 Urinary tract infection, site not specified: Secondary | ICD-10-CM

## 2019-01-06 MED ORDER — SULFAMETHOXAZOLE-TRIMETHOPRIM 800-160 MG PO TABS: 1.0000 | ORAL_TABLET | Freq: Two times a day (BID) | ORAL | 0 refills | Status: DC

## 2019-01-14 ENCOUNTER — Encounter: Payer: Self-pay | Admitting: Nurse Practitioner

## 2019-01-14 ENCOUNTER — Other Ambulatory Visit: Payer: Self-pay

## 2019-01-14 DIAGNOSIS — R238 Other skin changes: Secondary | ICD-10-CM

## 2019-01-14 MED ORDER — EUCRISA 2 % EX OINT: 1.0000 | TOPICAL_OINTMENT | Freq: Two times a day (BID) | CUTANEOUS | 3 refills | Status: DC

## 2019-01-15 ENCOUNTER — Other Ambulatory Visit: Payer: Self-pay

## 2019-01-15 DIAGNOSIS — R238 Other skin changes: Secondary | ICD-10-CM

## 2019-01-15 MED ORDER — EUCRISA 2 % EX OINT: 1.0000 | TOPICAL_OINTMENT | Freq: Two times a day (BID) | CUTANEOUS | 3 refills | Status: DC

## 2019-01-20 ENCOUNTER — Other Ambulatory Visit: Payer: Self-pay

## 2019-01-20 MED ORDER — TRIAMCINOLONE ACETONIDE 0.1 % EX CREA: TOPICAL_CREAM | CUTANEOUS | 0 refills | Status: DC

## 2019-02-14 ENCOUNTER — Encounter: Payer: Self-pay | Admitting: Nurse Practitioner

## 2019-02-16 ENCOUNTER — Other Ambulatory Visit (INDEPENDENT_AMBULATORY_CARE_PROVIDER_SITE_OTHER): Payer: BC Managed Care – PPO

## 2019-02-16 ENCOUNTER — Other Ambulatory Visit: Payer: Self-pay | Admitting: Nurse Practitioner

## 2019-02-16 ENCOUNTER — Other Ambulatory Visit (HOSPITAL_COMMUNITY)
Admission: RE | Admit: 2019-02-16 | Discharge: 2019-02-16 | Disposition: A | Discharge status: Home / Self Care | Payer: BC Managed Care – PPO | Source: Ambulatory Visit | Attending: Nurse Practitioner | Admitting: Nurse Practitioner

## 2019-02-16 DIAGNOSIS — N390 Urinary tract infection, site not specified: Secondary | ICD-10-CM

## 2019-02-16 DIAGNOSIS — Z113 Encounter for screening for infections with a predominantly sexual mode of transmission: Secondary | ICD-10-CM | POA: Insufficient documentation

## 2019-02-16 LAB — POCT URINALYSIS DIPSTICK
Bilirubin, UA: NEGATIVE
Glucose, UA: NEGATIVE
Ketones, UA: NEGATIVE
Leukocytes, UA: NEGATIVE
Nitrite, UA: POSITIVE
Protein, UA: NEGATIVE
Spec Grav, UA: >=1.03 — AB (ref 1.010–1.025)
Urobilinogen, UA: 0.2 E.U./dL
pH, UA: 5.5 (ref 5.0–8.0)

## 2019-02-18 LAB — URINE CULTURE

## 2019-02-19 ENCOUNTER — Ambulatory Visit (INDEPENDENT_AMBULATORY_CARE_PROVIDER_SITE_OTHER): Payer: BC Managed Care – PPO

## 2019-02-19 VITALS — BP 110/64 | HR 67 | Ht 63.8 in | Wt 150.8 lb

## 2019-02-19 DIAGNOSIS — R319 Hematuria, unspecified: Secondary | ICD-10-CM | POA: Diagnosis not present

## 2019-02-19 DIAGNOSIS — N390 Urinary tract infection, site not specified: Secondary | ICD-10-CM

## 2019-02-19 LAB — URINE CYTOLOGY ANCILLARY ONLY
Bacterial Vaginitis-Urine: NEGATIVE
Candida Urine: NEGATIVE
Chlamydia: NEGATIVE
Comment: NEGATIVE
Comment: NEGATIVE
Comment: NORMAL
Neisseria Gonorrhea: NEGATIVE
Trichomonas: NEGATIVE

## 2019-02-19 MED ORDER — CEFTRIAXONE SODIUM 1 G IJ SOLR
1.0000 g | Freq: Once | INTRAMUSCULAR | Status: AC
  Administered 2019-02-19: 1 g via INTRAMUSCULAR

## 2019-02-19 NOTE — Progress Notes (Signed)
Pt came in today for a rocephin inject for uti. All allergies and side effects discussed with pt. Pt is understanding. Pt advised to go to er if she experiences any life threatening side effects.

## 2019-03-04 ENCOUNTER — Ambulatory Visit: Payer: BC Managed Care – PPO | Admitting: Nurse Practitioner

## 2019-03-04 ENCOUNTER — Encounter: Payer: Self-pay | Admitting: Neurology

## 2019-03-04 ENCOUNTER — Other Ambulatory Visit: Payer: Self-pay

## 2019-03-04 ENCOUNTER — Ambulatory Visit: Payer: BC Managed Care – PPO | Admitting: Neurology

## 2019-03-04 VITALS — BP 116/79 | HR 68 | Temp 96.8°F | Ht 63.8 in | Wt 151.6 lb

## 2019-03-04 DIAGNOSIS — G35 Multiple sclerosis: Secondary | ICD-10-CM | POA: Diagnosis not present

## 2019-03-04 DIAGNOSIS — R5383 Other fatigue: Secondary | ICD-10-CM | POA: Diagnosis not present

## 2019-03-04 DIAGNOSIS — G43709 Chronic migraine without aura, not intractable, without status migrainosus: Secondary | ICD-10-CM

## 2019-03-04 DIAGNOSIS — G4700 Insomnia, unspecified: Secondary | ICD-10-CM

## 2019-03-04 DIAGNOSIS — IMO0002 Reserved for concepts with insufficient information to code with codable children: Secondary | ICD-10-CM

## 2019-03-04 DIAGNOSIS — Z79899 Other long term (current) drug therapy: Secondary | ICD-10-CM

## 2019-03-04 MED ORDER — EMGALITY 120 MG/ML ~~LOC~~ SOAJ: 1.0000 "pen " | SUBCUTANEOUS | 4 refills | Status: DC

## 2019-03-04 NOTE — Progress Notes (Addendum)
[facility]  PATIENT: Kelly Alvarado DOB: 09-28-1975  REFERRING DOCTOR OR PCP:  [name]  SOURCE: patient and records  _________________________________   HISTORICAL  CHIEF COMPLAINT:  Chief Complaint  Patient presents with  . Follow-up    RM 12, alone. Last seen 09/02/2018. She feels Ocrevus is not working for her. She is also wanting letter to get her out of work longer. 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 She was written out until today.   . Multiple Sclerosis    On Ocrevus. Last infusion 10/2018. Next one scheduled for 04/06/2019 w/ intrafusion here at [facility].    HISTORY OF PRESENT ILLNESS:  Kelly Alvarado is a 44 year old woman with relapsing remitting MS diagnosed in 2010 and h/o a seizure in 2017.  Update 03/04/2019: After the last visit, she switched to [place] as MRI showed multiple lesions not present on previus MRI and she had an exacerbation in 2019.   Her next infusion is scheduled for 04/06/2019.  On labor day weekend (a week after an infusion), she passed out.   She has continued to have dizziness, vertigo, especially when riding in a car.    .      She has a brain fog.   This was much better when she was on Tysabri.   Unfortunately, due to a very high JCV Ab titer (3.36) she needed to stop Tysabri  She would like to stop the Ocrevus and switch to a different disease modifying therapy.  Unfortunately, she had exacerbation on Gilenya and new MRI foci.  Therefore I would like her to stay on a highly effective medicine.  We discussed options.  She could stay on Ocrevus.  Tysabri is not a good choice as she is high positive.  [place] Falling is similar in strength and we discussed this in much more detail going over the hypertension and vascular side effects she is potentially interested and I gave her more information to read.  Fortunately we already have all with the lab work necessary for chronic infections.  We did discuss that I would need to wait for her to get to 6  months after her last Ocrevus.  Additionally, I would like her to be vaccinated for the COVID-19 virus and wait at least 10 days after her second shot before starting.  She also has had more headaches headaches.   Zonisamide did not help the headaches so she stopped.  She now is having headaches with migrainous features (nausea, photophobia and increasing with movement) that occur on an almost daily basis (25+/30 days a month for greater than 4 hours a day).   She has pain in her lower back and knees.  She has more stiffness  Update 09/02/2018: Repeat MRI 07/01/2018 showed classic MS lesions in the hemispheres and also in the brainstem.  The MRI of the cervical spine showed 3 plaques in the upper cervical spine and one plaque in the upper thoracic spine.  None of these were present on MRI from 2010.  Several of the foci in the brain had not been present on her previous brain MRI.  Last year she had an exacerbation with decreased sensation in the legs and a year before she may have had another exacerbation.  She has been compliant on Gilenya during this time.  We had discussed options over the phone.  Ocrevus may be a little more efficacious than Gilenya.  We discussed there could be additional respiratory infection risks during the COVID-19 pandemic.  We also discussed the  risk of breast cancer from the clinical studies  She has numbness in the toes since the exacerbation last year.  We had started gabapentin and increase the dose further last month.  She feels she is doing better on the higher dose..   Gabapentin tid has reduced the dysesthesias.  She has noticed more migraines on zonisamide that she had on topiramate.  However, she tolerates the zonisamide much better and prefers to stay on it.  She is noting more trouble with insomnia.  This is both sleep maintenance and sleep onset.  Adderall helps her attention/focus though does not help her fatigue much.  Update 12/05/2017: She is on Gilenya and has  tolerated it well.    She has had 3 falls over the past 2 weeks, 2 at work and one at home. She also notes numbness with a cold sensation in her right arm.   She notes she has dropped some items.   Her legs are not feeling weaker.   Her falls have been trips, twice not clearing steps.   She is not sure which foot did not clear the steps.    She had a rotational vertigo episode a week ago.    She  Had been on Tysabri and did great but was JCV Ab positive.   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    Update 08/28/2017: She feels her MS is mostly stable.  She is on Gilenya and she tolerates it well.  Although she has no definite exacerbations, she is not feeling as good over the last 2 weeks (AC was broken and temps were elevated).    It is fixed now but she only feels slightly better.    She remains fatigued and feels achy .   The soreness is mostly in the limbs and neck and around her torso.   Her neck is stiff.       She has had more trouble with insomnia, both sleep onset and sleep maintenance.   She used to sleep much better.     She has achiness in both hands. She takes gabapentin 600 mg at night and was sleeping well until this recent episode.   It also helped her dysesthesias until the last week.     She denies change in bladder function with some frequency.   She notes reduced vision but has not seen ophthalmology recently.       No recent seizures.    She feels migraines are about the same about 2 days a week.     She is still on topiramate and takes naprosyn when she has one.    At the beginning of the visit, she had no headache but noted one after the examination.     She denies any fevers.     Update 02/20/2016: She is on Gilenya and has not had any definite exacerbations.   He tolerates it well.   No new weakness r gait change.   However, she has had more painful burning dysesthesia in her leg up to the chest, mostly on her right  side.    Initially, this only happened at night but now is happening day and night.   Sometimes she feels weaker afterwards and she may have mild aching pain in between the pain.   These episodes last a minute or two at a time and occur several t times a week but sometimes up to 5  times a day.   Compression stockings and ice have not helped.  In the past, she had been on gabapentin for headache but not for this symptoms.   It had not help ed her other symptom.  She thinks she tolerated it well.       Recent labwork shows lymphocyte count = 0.2.   LFTs were normal.  Migraines are occurring about once a week for 36-48 hours.   Triptans have not helped.    She usually takes several Tylenols but that usually does not help.    Naproxen sometimes helps more but upsets her stomach.     She notes urinary frequency but no incontinence.     She has not had any more seizures.     From 01/03/2016: MS:   She feels stable and denies any new symptoms since the last visit.   She is taking Gilenya daily and tolerates it well. She has not had any definite exacerbations while she has taken the medication.    Dysesthesia:   She is having electric shock like sensations all over but worse in right arm and legs.    She feels these dysesthesias are slightly better since starting Topamax and she tolerates it well.    She denies Lhermite's signs.  Shocks are random and frequent.     She had more a hysterectomy last week and had more dysesthesia afterwards, like the right leg was on fire.             Gait/strength: She feels her gait returned to normal after initial exacerbation. Strength  is normal in the limbs.  Vision: She has mild reduced color saturation out of the right eye and notes mild blurriness at times. No diplopia.    Bladder:   She has occasional urinary frequency or hesitancy. No incontinence.   No hesitancy  Fatigue/sleep: She notes fatigue that gets worse as the day goes on. This is both physical and  cognitive. She has gained some weight even though she is trying to stay active. Insomnia is a little worse (both sleep onset and maintenance).        Mood/cognition:   She denies any depression or anxiety. No major cognitive dysfunction but does have a little bit of issues at times. Mostly she finds that she has decreased focus and attention. In the past, she was diagnosed with ADD. Vyvanse was not well tolerated.    Migraines: She is having migraines about 7-8 days a month. Typically migraine lasts 2 days. There occur randomly or associated with her cycle.   When present, the pain is usually over the left eye and is constant, more than throbbing. When severe it is bilateral.   She will sometimes have a mild visual aura before the migraine. When present, she will have photophobia and phonophobia. She will get nausea and occasionally will get vomiting. Moving will make the headaches worse.  Sometimes being touch in the head will trigger a migraine. Topamax at low dose did not help much.   A higher dose is being tried d much.    Triptan such as Imitrex and Maxalt have not really helped reduce the headache pain. Fioricet had not helped in the past. Midrin and Hydrocodone or indomethacin often help some.       MS History:   She was diagnosed with multiple sclerosis in 2010 after presenting with a left foot drop. MRI of the brain was consistent with multiple sclerosis.   She was  started  on Rebif. The MS was well controlled but she felt poorly on that medication. She transferred to [facility] and began to see me in 2012. She was placed on Tysabri for about a year and felt much better while on it. Her MS did well on Tysabri as well.  Unfortunately, she was JCV positive and after about a year she stopped Tysabri and started Gilenya. She has done fairly well on Gilenya with no definite exacerbations.      REVIEW OF SYSTEMS: Constitutional: No fevers, chills, sweats, or change in appetite.  She notes  fatigue and insomnia. Eyes: No visual changes, double vision, eye pain Ear, nose and throat: No hearing loss, ear pain, nasal congestion, sore throat Cardiovascular: No chest pain, palpitations Respiratory: No shortness of breath at rest or with exertion.   No wheezes GastrointestinaI: No nausea, vomiting, diarrhea, abdominal pain, fecal incontinence Genitourinary: No dysuria, urinary retention or frequency.  No nocturia. Musculoskeletal: No neck pain, back .  Notes myalgias at times Integumentary: No rash, pruritus, skin lesions Neurological: as above Psychiatric: No depression at this time.  No anxiety Endocrine: No palpitations, diaphoresis, change in appetite, change in weigh or increased thirst Hematologic/Lymphatic: No anemia, purpura, petechiae. Allergic/Immunologic: No itchy/runny eyes, nasal congestion, recent allergic reactions, rashes.  She has some food allergies.  ALLERGIES: Allergies  Allergen Reactions  . Amoxicillin Hives    Has patient had a PCN reaction causing immediate rash, facial/tongue/throat swelling, SOB or lightheadedness with hypotension: Yes Has patient had a PCN reaction causing severe rash involving mucus membranes or skin necrosis: Yes Has patient had a PCN reaction that required hospitalization No Has patient had a PCN reaction occurring within the last 10 years: No If all of the above answers are "NO", then may proceed with Cephalosporin use.   . Milk-Related Compounds Diarrhea and Nausea And Vomiting    Stomach cramps    HOME MEDICATIONS:  Current Outpatient Medications:  .  amphetamine-dextroamphetamine (ADDERALL XR) 25 MG 24 hr capsule, Take 1 capsule by mouth daily., Disp: 30 capsule, Rfl: 0 .  BD PEN NEEDLE NANO U/F 32G X 4 MM MISC, USE AS DIRECTED, Disp: 30 each, Rfl: 5 .  gabapentin (NEURONTIN) 300 MG capsule, TAKE 1 CAPSULE IN AM, AND 1 CAP IN EVENING, AND 2 AT BEDTIME, Disp: 360 capsule, Rfl: 3 .  levocetirizine (XYZAL) 5 MG tablet,  TAKE 1 TABLET BY MOUTH EVERY DAY EVERY EVENING, Disp: 90 tablet, Rfl: 1 .  Liraglutide -Weight Management (SAXENDA) 18 MG/3ML SOPN, Inject 3 mg into the skin daily., Disp: 5 pen, Rfl: 1 .  triamcinolone cream (KENALOG) 0.1 %, Apply sparingly to the face daily, Disp: 45 g, Rfl: 0 .  Vitamin D, Ergocalciferol, (DRISDOL) 1.25 MG (50000 UT) CAPS capsule, Take 1 capsule (50,000 Units total) by mouth 2 (two) times a week., Disp: 24 capsule, Rfl: 1 .  Galcanezumab-gnlm (EMGALITY) 120 MG/ML SOAJ, Inject 1 pen into the skin every 28 (twenty-eight) days., Disp: 3 pen, Rfl: 4  PAST MEDICAL HISTORY: Past Medical History:  Diagnosis Date  . Allergy   . Anemia    HEAVY PERIODS  . Headache(784.0)    MIGRAINES  . Multiple sclerosis ([place])   . Neuromuscular disorder ([place])    MS  . Pulmonary embolism ([place]) 2013   lt-?bcp  . Wears glasses     PAST SURGICAL HISTORY: Past Surgical History:  Procedure Laterality Date  . BILATERAL SALPINGECTOMY Bilateral 12/27/2015   Procedure: BILATERAL SALPINGECTOMY;  Surgeon: Everett Graff, MD;  Location: [facility];  Service: Gynecology;  Laterality: Bilateral;  Excision of paratubal cyst  . BREAST LUMPECTOMY WITH RADIOACTIVE SEED LOCALIZATION Left 10/06/2013   Procedure:  RADIOACTIVE SEED LOCALIZATION LEFT BREAST LUMPECTOMY;  Surgeon: Odis Hollingshead, MD;  Location: [facility];  Service: General;  Laterality: Left;  . BREAST REDUCTION SURGERY    . CYSTOSCOPY N/A 12/27/2015   Procedure: CYSTOSCOPY;  Surgeon: Everett Graff, MD;  Location: [facility];  Service: Gynecology;  Laterality: N/A;  . REPAIR VAGINAL CUFF N/A 01/09/2016   Procedure: REPAIR VAGINAL CUFF;  Surgeon: Everett Graff, MD;  Location: [facility];  Service: Gynecology;  Laterality: N/A;  . VAGINAL HYSTERECTOMY Bilateral 12/27/2015   Procedure: HYSTERECTOMY VAGINAL;  Surgeon: Everett Graff, MD;  Location: [facility];  Service: Gynecology;  Laterality: Bilateral;  . WISDOM TOOTH EXTRACTION      FAMILY  HISTORY: Family History  Problem Relation Age of Onset  . Asthma Mother   . Hypertension Father   . Cancer Paternal Uncle   . Cancer Paternal Grandfather     SOCIAL HISTORY:  Social History   Socioeconomic History  . Marital status: Married    Spouse name: Not on file  . Number of children: Not on file  . Years of education: Not on file  . Highest education level: Not on file  Occupational History  . Not on file  Tobacco Use  . Smoking status: Never Smoker  . Smokeless tobacco: Never Used  Substance and Sexual Activity  . Alcohol use: Yes    Alcohol/week: 0.0 standard drinks    Comment: occ  . Drug use: No  . Sexual activity: Yes    Birth control/protection: None    Comment: VASECTOMY  Other Topics Concern  . Not on file  Social History Narrative   Right handed   Caffeine use: sometimes   Social Determinants of Health   Financial Resource Strain:   . Difficulty of Paying Living Expenses: Not on file  Food Insecurity:   . Worried About Charity fundraiser in the Last Year: Not on file  . Ran Out of Food in the Last Year: Not on file  Transportation Needs:   . Lack of Transportation (Medical): Not on file  . Lack of Transportation (Non-Medical): Not on file  Physical Activity:   . Days of Exercise per Week: Not on file  . Minutes of Exercise per Session: Not on file  Stress:   . Feeling of Stress : Not on file  Social Connections:   . Frequency of Communication with Friends and Family: Not on file  . Frequency of Social Gatherings with Friends and Family: Not on file  . Attends Religious Services: Not on file  . Active Member of Clubs or Organizations: Not on file  . Attends Archivist Meetings: Not on file  . Marital Status: Not on file  Intimate Partner Violence:   . Fear of Current or Ex-Partner: Not on file  . Emotionally Abused: Not on file  . Physically Abused: Not on file  . Sexually Abused: Not on file     PHYSICAL EXAM  Vitals:    03/04/19 0820  BP: 116/79  Pulse: 68  Temp: (!) 96.8 F (36 C)  Weight: 151 lb 9.6 oz (68.8 kg)  Height: 5' 3.8" (1.621 m)    Body mass index is 26.19 kg/m.   General: The patient is well-developed and well-nourished and in no acute distress   Musculoskeletal:  Neck has good  ROM.    Neurologic Exam  Mental status: The patient is alert and oriented x 3 at the time of the examination. The patient has apparent normal recent and remote memory, with an apparently normal attention span and concentration ability.   Speech is normal.  Cranial nerves: Extraocular movements are full.  Facial strength is normal.  Trapezius strength was normal.. No obvious hearing deficits are noted.  Motor:  Muscle bulk is normal.   Tone is normal. Strength is  5 / 5 in all 4 extremities except 4+ in the left toe extensors  Sensory: She has intact sensation to touch and vibration in the arms and legs.  Coordination: Cerebellar testing reveals good finger-nose-finger and heel-to-shin bilaterally.  Gait and station: Station is normal.   Gait is normal but the tandem gait is mildly wide.. Romberg is negative.   Reflexes: Deep tendon reflexes are symmetric and normal bilaterally.        DIAGNOSTIC DATA (LABS, IMAGING, TESTING) - I reviewed patient records, labs, notes, testing and imaging myself where available.  Lab Results  Component Value Date   WBC 5.0 10/13/2018   HGB 11.6 (L) 10/13/2018   HCT 34.6 (L) 10/13/2018   MCV 96.4 10/13/2018   PLT 317 10/13/2018        ASSESSMENT AND PLAN    1. Multiple sclerosis ([place])   2. High risk medication use   3. Other fatigue   4. Insomnia, unspecified type   5. Chronic migraine      1.   she is having issues tolerating Ocrevus, she is interested in switching to a different medication.  We discussed Lemtrada and the most detail and she would like to switch.  I would need to wait at least 6 months after her last Ocrevus infusion and I would also  want her to wait till after her second vaccination (2 weeks later would be okay) 2.   Recheck CBC and IgG/IgM 3.   continue gabapentin for the dysesthesias. 4.   Continue Adderall for ADD 5.   Emgality for chronic migraine she will return in 6 months or sooner if there are new or worsening neurologic symptoms.  45-minute visit including 35 minutes face-to-face interaction and 10 minutes documenting, laboratory review, imaging review  Westyn Keatley A. Felecia Shelling, MD, PhD [id number], [id number] AM Certified in Neurology, Clinical Neurophysiology, Sleep Medicine, Pain Medicine and Neuroimaging  [facility] [address], [place], [place] [address] [phone]

## 2019-03-06 LAB — COMPREHENSIVE METABOLIC PANEL
ALT: 11 IU/L (ref 0–32)
AST: 20 IU/L (ref 0–40)
Albumin/Globulin Ratio: 1.9 (ref 1.2–2.2)
Albumin: 4.7 g/dL (ref 3.8–4.8)
Alkaline Phosphatase: 67 IU/L (ref 39–117)
BUN/Creatinine Ratio: 9 (ref 9–23)
BUN: 8 mg/dL (ref 6–24)
Bilirubin Total: 0.4 mg/dL (ref 0.0–1.2)
CO2: 20 mmol/L (ref 20–29)
Calcium: 9.9 mg/dL (ref 8.7–10.2)
Chloride: 106 mmol/L (ref 96–106)
Creatinine, Ser: 0.86 mg/dL (ref 0.57–1.00)
GFR calc Af Amer: 94 mL/min/{1.73_m2} (ref >59)
GFR calc non Af Amer: 82 mL/min/{1.73_m2} (ref >59)
Globulin, Total: 2.5 g/dL (ref 1.5–4.5)
Glucose: 71 mg/dL (ref 65–99)
Potassium: 4.3 mmol/L (ref 3.5–5.2)
Sodium: 142 mmol/L (ref 134–144)
Total Protein: 7.2 g/dL (ref 6.0–8.5)

## 2019-03-06 LAB — CBC WITH DIFFERENTIAL/PLATELET
Basophils Absolute: 0 10*3/uL (ref 0.0–0.2)
Basos: 1 %
EOS (ABSOLUTE): 0 10*3/uL (ref 0.0–0.4)
Eos: 1 %
Hematocrit: 36.9 % (ref 34.0–46.6)
Hemoglobin: 12.8 g/dL (ref 11.1–15.9)
Immature Grans (Abs): 0 10*3/uL (ref 0.0–0.1)
Immature Granulocytes: 0 %
Lymphocytes Absolute: 0.6 10*3/uL — ABNORMAL LOW (ref 0.7–3.1)
Lymphs: 17 %
MCH: 33.1 pg — ABNORMAL HIGH (ref 26.6–33.0)
MCHC: 34.7 g/dL (ref 31.5–35.7)
MCV: 95 fL (ref 79–97)
Monocytes Absolute: 0.3 10*3/uL (ref 0.1–0.9)
Monocytes: 8 %
Neutrophils Absolute: 2.7 10*3/uL (ref 1.4–7.0)
Neutrophils: 73 %
Platelets: 272 10*3/uL (ref 150–450)
RBC: 3.87 x10E6/uL (ref 3.77–5.28)
RDW: 12.4 % (ref 11.7–15.4)
WBC: 3.7 10*3/uL (ref 3.4–10.8)

## 2019-03-06 LAB — IGG, IGA, IGM
IgA/Immunoglobulin A, Serum: 129 mg/dL (ref 87–352)
IgG (Immunoglobin G), Serum: 1194 mg/dL (ref 586–1602)
IgM (Immunoglobulin M), Srm: 59 mg/dL (ref 26–217)

## 2019-03-10 ENCOUNTER — Encounter: Payer: Self-pay | Admitting: *Deleted

## 2019-04-01 ENCOUNTER — Other Ambulatory Visit: Payer: Self-pay | Admitting: Nurse Practitioner

## 2019-04-01 DIAGNOSIS — E663 Overweight: Secondary | ICD-10-CM

## 2019-04-15 ENCOUNTER — Telehealth: Payer: Self-pay | Admitting: Neurology

## 2019-04-15 NOTE — Telephone Encounter (Signed)
Pt called wanting to touch base on what her return to work plan is and her infusions. Requesting a CB from MD

## 2019-04-15 NOTE — Telephone Encounter (Signed)
I spoke with Ms. Kelly Alvarado

## 2019-04-15 NOTE — Telephone Encounter (Signed)
Called and spoke with pt. Relayed per Dr. Felecia Shelling that he wants her to get labs done at the end of this month and we are going to try and her get her started on Lemtrada the second week of April.  Confirmed she is getting second dose of covid-19 vaccine 04/23/2019. 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 She fell this past Sunday, she is ok. Pt wanted to speak with Dr. Felecia Shelling directly. I parked call and Dr. Felecia Shelling picked up call and spoke with pt. He wants the following labs completed: CBC w/ diff, CMP, TSH, UA

## 2019-04-16 ENCOUNTER — Other Ambulatory Visit: Payer: Self-pay | Admitting: *Deleted

## 2019-04-16 DIAGNOSIS — G35 Multiple sclerosis: Secondary | ICD-10-CM

## 2019-04-16 DIAGNOSIS — Z79899 Other long term (current) drug therapy: Secondary | ICD-10-CM

## 2019-04-16 NOTE — Telephone Encounter (Signed)
Per Dr. Felecia Shelling, pt agreeable to continue with getting initiated on Lemtrada. Start form faxed to [facility] program at [phone] 04/15/2019. Received fax confirmation. Gave copy to intrafusion to start processing for pt. Placed future lab orders.

## 2019-04-21 ENCOUNTER — Other Ambulatory Visit (INDEPENDENT_AMBULATORY_CARE_PROVIDER_SITE_OTHER): Payer: Self-pay

## 2019-04-21 DIAGNOSIS — G35 Multiple sclerosis: Secondary | ICD-10-CM

## 2019-04-21 DIAGNOSIS — Z0289 Encounter for other administrative examinations: Secondary | ICD-10-CM

## 2019-04-21 DIAGNOSIS — Z79899 Other long term (current) drug therapy: Secondary | ICD-10-CM

## 2019-04-22 LAB — COMPREHENSIVE METABOLIC PANEL
ALT: 9 IU/L (ref 0–32)
AST: 11 IU/L (ref 0–40)
Albumin/Globulin Ratio: 2 (ref 1.2–2.2)
Albumin: 4.2 g/dL (ref 3.8–4.8)
Alkaline Phosphatase: 66 IU/L (ref 39–117)
BUN/Creatinine Ratio: 10 (ref 9–23)
BUN: 8 mg/dL (ref 6–24)
Bilirubin Total: 0.4 mg/dL (ref 0.0–1.2)
CO2: 22 mmol/L (ref 20–29)
Calcium: 9.4 mg/dL (ref 8.7–10.2)
Chloride: 104 mmol/L (ref 96–106)
Creatinine, Ser: 0.8 mg/dL (ref 0.57–1.00)
GFR calc Af Amer: 103 mL/min/{1.73_m2} (ref >59)
GFR calc non Af Amer: 89 mL/min/{1.73_m2} (ref >59)
Globulin, Total: 2.1 g/dL (ref 1.5–4.5)
Glucose: 82 mg/dL (ref 65–99)
Potassium: 4 mmol/L (ref 3.5–5.2)
Sodium: 139 mmol/L (ref 134–144)
Total Protein: 6.3 g/dL (ref 6.0–8.5)

## 2019-04-22 LAB — CBC WITH DIFFERENTIAL/PLATELET
Basophils Absolute: 0 10*3/uL (ref 0.0–0.2)
Basos: 1 %
EOS (ABSOLUTE): 0 10*3/uL (ref 0.0–0.4)
Eos: 1 %
Hematocrit: 33.8 % — ABNORMAL LOW (ref 34.0–46.6)
Hemoglobin: 11.7 g/dL (ref 11.1–15.9)
Immature Grans (Abs): 0 10*3/uL (ref 0.0–0.1)
Immature Granulocytes: 0 %
Lymphocytes Absolute: 0.8 10*3/uL (ref 0.7–3.1)
Lymphs: 19 %
MCH: 32.6 pg (ref 26.6–33.0)
MCHC: 34.6 g/dL (ref 31.5–35.7)
MCV: 94 fL (ref 79–97)
Monocytes Absolute: 0.3 10*3/uL (ref 0.1–0.9)
Monocytes: 8 %
Neutrophils Absolute: 3 10*3/uL (ref 1.4–7.0)
Neutrophils: 71 %
Platelets: 279 10*3/uL (ref 150–450)
RBC: 3.59 x10E6/uL — ABNORMAL LOW (ref 3.77–5.28)
RDW: 12.2 % (ref 11.7–15.4)
WBC: 4.2 10*3/uL (ref 3.4–10.8)

## 2019-04-22 LAB — URINALYSIS, ROUTINE W REFLEX MICROSCOPIC
Bilirubin, UA: NEGATIVE
Glucose, UA: NEGATIVE
Ketones, UA: NEGATIVE
Leukocytes,UA: NEGATIVE
Nitrite, UA: NEGATIVE
Protein,UA: NEGATIVE
RBC, UA: NEGATIVE
Specific Gravity, UA: 1.013 (ref 1.005–1.030)
Urobilinogen, Ur: 0.2 mg/dL (ref 0.2–1.0)
pH, UA: 7 (ref 5.0–7.5)

## 2019-04-22 LAB — TSH: TSH: 1.32 u[IU]/mL (ref 0.450–4.500)

## 2019-04-23 ENCOUNTER — Telehealth: Payer: Self-pay | Admitting: *Deleted

## 2019-04-23 NOTE — Telephone Encounter (Signed)
-----   Message from Britt Bottom, MD sent at 04/23/2019  8:38 AM EDT ----- The labwork is fine --- if she wants to stay on Ocrevus, we can get her set up for next dose --- otherwise we can get Lemtrada scheduled

## 2019-05-11 ENCOUNTER — Telehealth: Payer: Self-pay | Admitting: *Deleted

## 2019-05-11 MED ORDER — ZOLPIDEM TARTRATE 5 MG PO TABS: 5.0000 mg | ORAL_TABLET | Freq: Every evening | ORAL | 2 refills | Status: DC | PRN

## 2019-05-11 NOTE — Telephone Encounter (Addendum)
Dr. Felecia Shelling recommends zolpidem 5mg  po qhs #30, 2 refills. I spoke with pt. She is agreeable to this plan. Aware we will send in rx today to [facility] in [place] for her. She will call back if any further questions or concerns.   Drug registry checked. Has received adderall from Dr. Felecia Shelling 06/2018 and 08/2018. Received hydrocodone 03/05/2018 from [facility].  Pt last seen 03/04/2019

## 2019-05-11 NOTE — Telephone Encounter (Signed)
Pt here today for infusion. Reported ongoing insomnia. Previous headaches have resolved. Wanting to know if Dr. Felecia Shelling can prescribe something for this now.

## 2019-05-11 NOTE — Addendum Note (Signed)
Addended by: Arlice Colt A on: 05/11/2019 12:26 PM   Modules accepted: Orders

## 2019-05-11 NOTE — Addendum Note (Signed)
Addended by: Roberts Gaudy L on: 05/11/2019 11:30 AM   Modules accepted: Orders

## 2019-05-19 ENCOUNTER — Other Ambulatory Visit: Payer: Self-pay | Admitting: Nurse Practitioner

## 2019-05-19 DIAGNOSIS — E559 Vitamin D deficiency, unspecified: Secondary | ICD-10-CM

## 2019-06-20 ENCOUNTER — Other Ambulatory Visit: Payer: Self-pay | Admitting: Neurology

## 2019-06-26 ENCOUNTER — Other Ambulatory Visit: Payer: Self-pay | Admitting: Nurse Practitioner

## 2019-06-26 DIAGNOSIS — E559 Vitamin D deficiency, unspecified: Secondary | ICD-10-CM

## 2019-06-28 ENCOUNTER — Other Ambulatory Visit: Payer: Self-pay | Admitting: Nurse Practitioner

## 2019-06-29 ENCOUNTER — Other Ambulatory Visit: Payer: Self-pay | Admitting: *Deleted

## 2019-06-29 MED ORDER — VITAMIN D (ERGOCALCIFEROL) 1.25 MG (50000 UNIT) PO CAPS: 50000.0000 [IU] | ORAL_CAPSULE | ORAL | 1 refills | Status: DC

## 2019-07-20 ENCOUNTER — Other Ambulatory Visit: Payer: Self-pay | Admitting: Neurology

## 2019-07-20 MED ORDER — AMPHETAMINE-DEXTROAMPHET ER 25 MG PO CP24: 25.0000 mg | ORAL_CAPSULE | Freq: Every day | ORAL | 0 refills | Status: DC

## 2019-07-20 NOTE — Telephone Encounter (Signed)
The last refill of Adderall was 09/02/2018 Dextroamp-Amphet Er 25 Mg Cap #30.00 for 30 day supply. Pt up to date on her appointments. Has one pending for 08/12/19. Will send request to Dr Felecia Shelling.

## 2019-07-20 NOTE — Telephone Encounter (Signed)
Pt called needing a refill on her amphetamine-dextroamphetamine (ADDERALL XR) 25 MG 24 hr capsule sent in to the [facility] on [address].

## 2019-07-24 ENCOUNTER — Other Ambulatory Visit: Payer: Self-pay

## 2019-07-24 ENCOUNTER — Ambulatory Visit
Admission: EM | Admit: 2019-07-24 | Discharge: 2019-07-24 | Disposition: A | Discharge status: Home / Self Care | Payer: BC Managed Care – PPO | Attending: Emergency Medicine | Admitting: Emergency Medicine

## 2019-07-24 DIAGNOSIS — Z79899 Other long term (current) drug therapy: Secondary | ICD-10-CM | POA: Diagnosis not present

## 2019-07-24 DIAGNOSIS — Z86711 Personal history of pulmonary embolism: Secondary | ICD-10-CM | POA: Diagnosis not present

## 2019-07-24 DIAGNOSIS — R82998 Other abnormal findings in urine: Secondary | ICD-10-CM | POA: Insufficient documentation

## 2019-07-24 DIAGNOSIS — G35 Multiple sclerosis: Secondary | ICD-10-CM | POA: Insufficient documentation

## 2019-07-24 DIAGNOSIS — Z880 Allergy status to penicillin: Secondary | ICD-10-CM | POA: Insufficient documentation

## 2019-07-24 DIAGNOSIS — R8290 Unspecified abnormal findings in urine: Secondary | ICD-10-CM | POA: Diagnosis present

## 2019-07-24 LAB — POCT URINALYSIS DIP (MANUAL ENTRY)
Bilirubin, UA: NEGATIVE
Glucose, UA: NEGATIVE mg/dL
Ketones, POC UA: NEGATIVE mg/dL
Nitrite, UA: NEGATIVE
Protein Ur, POC: NEGATIVE mg/dL
Spec Grav, UA: 1.01 (ref 1.010–1.025)
Urobilinogen, UA: 0.2 E.U./dL
pH, UA: 6.5 (ref 5.0–8.0)

## 2019-07-24 NOTE — ED Provider Notes (Signed)
[facility]    CSN: [id number] Arrival date & time: 07/24/19  1717      History   Chief Complaint Chief Complaint  Patient presents with  . Dysuria    HPI Kelly Alvarado is a 44 y.o. female with history of migraines, PE, MS presenting for 2-week course of malodorous urine.  Denying urinary frequency urgency, dysuria, abdominal, back pain, fever.  No vaginal discharge or pelvic pain.  No concern for STI.  Denies change in medication, lifestyle, diet.  Tried cranberry juice, increasing water intake, Azo with certain symptoms without relief.  Denies history of renal calculi, pyelonephritis.   Past Medical History:  Diagnosis Date  . Allergy   . Anemia    HEAVY PERIODS  . Headache(784.0)    MIGRAINES  . Multiple sclerosis ([place])   . Neuromuscular disorder ([place])    MS  . Pulmonary embolism ([place]) 2013   lt-?bcp  . Wears glasses     Patient Active Problem List   Diagnosis Date Noted  . Convulsions ([place]) 02/29/2016  . Menorrhagia 12/27/2015  . Chronic migraine 09/02/2015  . Dysesthesia 09/02/2015  . Obesity 09/02/2015  . Common migraine without intractability 05/06/2014  . High risk medication use 05/06/2014  . Visual disturbance 05/06/2014  . Other fatigue 05/06/2014  . Insomnia 05/06/2014  . Myalgia and myositis 05/06/2014  . 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 Pulmonary embolism ([place]) 07/17/2011  . Multiple sclerosis ([place]) 07/17/2011    Past Surgical History:  Procedure Laterality Date  . BILATERAL SALPINGECTOMY Bilateral 12/27/2015   Procedure: BILATERAL SALPINGECTOMY;  Surgeon: Everett Graff, MD;  Location: [facility];  Service: Gynecology;  Laterality: Bilateral;  Excision of paratubal cyst  . BREAST LUMPECTOMY WITH RADIOACTIVE SEED LOCALIZATION Left 10/06/2013   Procedure:  RADIOACTIVE SEED LOCALIZATION LEFT BREAST LUMPECTOMY;  Surgeon: Odis Hollingshead, MD;  Location: [facility];  Service: General;  Laterality: Left;  .  BREAST REDUCTION SURGERY    . CYSTOSCOPY N/A 12/27/2015   Procedure: CYSTOSCOPY;  Surgeon: Everett Graff, MD;  Location: [facility];  Service: Gynecology;  Laterality: N/A;  . REPAIR VAGINAL CUFF N/A 01/09/2016   Procedure: REPAIR VAGINAL CUFF;  Surgeon: Everett Graff, MD;  Location: [facility];  Service: Gynecology;  Laterality: N/A;  . VAGINAL HYSTERECTOMY Bilateral 12/27/2015   Procedure: HYSTERECTOMY VAGINAL;  Surgeon: Everett Graff, MD;  Location: [facility];  Service: Gynecology;  Laterality: Bilateral;  . WISDOM TOOTH EXTRACTION      OB History    Gravida  5   Para  3   Term      Preterm      AB      Living  3     SAB      TAB      Ectopic      Multiple      Live Births               Home Medications    Prior to Admission medications   Medication Sig Start Date End Date Taking? Authorizing Provider  amphetamine-dextroamphetamine (ADDERALL XR) 25 MG 24 hr capsule Take 1 capsule by mouth daily. 07/20/19  Yes Sater, Nanine Means, MD  gabapentin (NEURONTIN) 300 MG capsule TAKE 1 CAPSULE IN AM, AND 1 CAP IN EVENING, AND 2 AT BEDTIME 06/22/19  Yes Sater, Richard A, MD  Galcanezumab-gnlm (EMGALITY) 120 MG/ML SOAJ Inject 1 pen into the skin every 28 (twenty-eight) days. 03/04/19  Yes Sater, Nanine Means, MD  levocetirizine (  XYZAL) 5 MG tablet TAKE 1 TABLET BY MOUTH EVERY DAY EVERY EVENING 06/28/19  Yes [name], FNP  Vitamin D, Ergocalciferol, (DRISDOL) 1.25 MG (50000 UNIT) CAPS capsule Take 1 capsule (50,000 Units total) by mouth 2 (two) times a week. 06/29/19  Yes Sater, Nanine Means, MD  BD PEN NEEDLE NANO U/F 32G X 4 MM MISC USE AS DIRECTED 10/30/18   [name], FNP  dicyclomine (BENTYL) 20 MG tablet Take 20 mg by mouth 2 (two) times daily. 07/01/19   [provider]  ocrelizumab (OCREVUS) 300 MG/10ML injection Inject 600 mg into the vein every 6 (six) months.    [provider]  SAXENDA 18 MG/3ML SOPN INJECT 3 MG INTO THE SKIN DAILY. 04/01/19   [name],  FNP  triamcinolone cream (KENALOG) 0.1 % Apply sparingly to the face daily 01/20/19   [name], FNP  zolpidem (AMBIEN) 5 MG tablet Take 1 tablet (5 mg total) by mouth at bedtime as needed for sleep. 05/11/19   Sater, Nanine Means, MD    Family History Family History  Problem Relation Age of Onset  . Asthma Mother   . Hypertension Father   . Cancer Paternal Uncle   . Cancer Paternal Grandfather     Social History Social History   Tobacco Use  . Smoking status: Never Smoker  . Smokeless tobacco: Never Used  Substance Use Topics  . Alcohol use: Yes    Alcohol/week: 0.0 standard drinks    Comment: occ  . Drug use: No     Allergies   Amoxicillin and Milk-related compounds   Review of Systems As per HPI   Physical Exam Triage Vital Signs ED Triage Vitals  Enc Vitals Group     BP      Pulse      Resp      Temp      Temp src      SpO2      Weight      Height      Head Circumference      Peak Flow      Pain Score      Pain Loc      Pain Edu?      Excl. in [place]?    No data found.  Updated Vital Signs BP (!) 162/84 (BP Location: Left Arm)   Pulse 75   Temp 98.2 F (36.8 C) (Oral)   Resp 18   LMP 12/11/2015 (Approximate)   SpO2 99%   Visual Acuity Right Eye Distance:   Left Eye Distance:   Bilateral Distance:    Right Eye Near:   Left Eye Near:    Bilateral Near:     Physical Exam Constitutional:      General: She is not in acute distress. HENT:     Head: Normocephalic and atraumatic.  Eyes:     General: No scleral icterus.    Pupils: Pupils are equal, round, and reactive to light.  Cardiovascular:     Rate and Rhythm: Normal rate.  Pulmonary:     Effort: Pulmonary effort is normal.  Abdominal:     General: Bowel sounds are normal.     Palpations: Abdomen is soft.     Tenderness: There is no abdominal tenderness. There is no right CVA tenderness, left CVA tenderness or guarding.  Genitourinary:    Comments: Patient declined, self-swab  performed Skin:    Coloration: Skin is not jaundiced or pale.  Neurological:     Mental  Status: She is alert and oriented to person, place, and time.      UC Treatments / Results  Labs (all labs ordered are listed, but only abnormal results are displayed) Labs Reviewed  POCT URINALYSIS DIP (MANUAL ENTRY) - Abnormal; Notable for the following components:      Result Value   Color, UA light yellow (*)    Blood, UA trace-intact (*)    Leukocytes, UA Trace (*)    All other components within normal limits  URINE CULTURE  CERVICOVAGINAL ANCILLARY ONLY    EKG   Radiology No results found.  Procedures Procedures (including critical care time)  Medications Ordered in UC Medications - No data to display  Initial Impression / Assessment and Plan / UC Course  I have reviewed the triage vital signs and the nursing notes.  Pertinent labs & imaging results that were available during my care of the patient were reviewed by me and considered in my medical decision making (see chart for details).     Afebrile, nontoxic in office today.  Urine dipstick significant for trace-intact RBC, trace WBC.  Culture pending.  Patient also notes history of BV, though states this does not feel familiar-requesting testing.  Cytology pending.  Patient electing to wait for all send out results prior to treatment.  I am agreeable to this as she is without concerning signs/symptoms as mentioned in HPI.  Return precautions discussed, patient verbalized understanding and is agreeable to plan. Final Clinical Impressions(s) / UC Diagnoses   Final diagnoses:  Malodorous urine     Discharge Instructions     Urine culture and vaginal swab pending: We will call you if any additional treatment is needed. Important to follow-up with your PCP for persistent or worsening symptoms and drink plenty of water in the interim. Return for worsening symptoms, abdominal back pain, fever, blood in urine.    ED  Prescriptions    None     PDMP not reviewed this encounter.   Hall-Potvin, [place], [place] 07/24/19 1945

## 2019-07-24 NOTE — Discharge Instructions (Signed)
Urine culture and vaginal swab pending: We will call you if any additional treatment is needed. Important to follow-up with your PCP for persistent or worsening symptoms and drink plenty of water in the interim. Return for worsening symptoms, abdominal back pain, fever, blood in urine.

## 2019-07-24 NOTE — ED Triage Notes (Signed)
Pt c/o dark concentrated, foul-smelling urine for approx 2 weeks. Denies frequency, urgency, hematuria, or burning on urination. Pt denies abdom pain, n/v/d, fever, chills, flank/back pain.  Pt states she has been drinking cranberry juice, plenty of water, and took AZO at start of symptoms.

## 2019-07-24 NOTE — ED Notes (Signed)
Pt states she accidentally urinated into the toilet and forgot to provide collection in urine container. New container and glass of drinking water given and pt advised to provide sample at her convenience/ability.

## 2019-07-28 LAB — CERVICOVAGINAL ANCILLARY ONLY
Bacterial Vaginitis (gardnerella): NEGATIVE
Candida Glabrata: NEGATIVE
Candida Vaginitis: NEGATIVE
Chlamydia: NEGATIVE
Comment: NEGATIVE
Comment: NEGATIVE
Comment: NEGATIVE
Comment: NEGATIVE
Comment: NEGATIVE
Comment: NORMAL
Neisseria Gonorrhea: NEGATIVE
Trichomonas: NEGATIVE

## 2019-07-29 ENCOUNTER — Telehealth: Payer: Self-pay

## 2019-07-29 ENCOUNTER — Telehealth: Payer: Self-pay | Admitting: Orthopedic Surgery

## 2019-07-29 LAB — URINE CULTURE: Culture: >=100000 — AB

## 2019-07-29 MED ORDER — FLUCONAZOLE 200 MG PO TABS: 200.0000 mg | ORAL_TABLET | Freq: Once | ORAL | 0 refills | Status: AC

## 2019-07-29 MED ORDER — NITROFURANTOIN MONOHYD MACRO 100 MG PO CAPS: 100.0000 mg | ORAL_CAPSULE | Freq: Two times a day (BID) | ORAL | 0 refills | Status: DC

## 2019-07-29 NOTE — Telephone Encounter (Signed)
Pt returned requesting perscriton to prevent yeast infection. Pt states "she always gets yeast infections while on antibiotics". Per PA Hall-Povtin okay to send Diflucan to pharmacy on file.

## 2019-07-30 ENCOUNTER — Other Ambulatory Visit: Payer: Self-pay | Admitting: Neurology

## 2019-08-12 ENCOUNTER — Telehealth: Payer: Self-pay | Admitting: *Deleted

## 2019-08-12 ENCOUNTER — Encounter: Payer: Self-pay | Admitting: Neurology

## 2019-08-12 ENCOUNTER — Ambulatory Visit: Payer: BC Managed Care – PPO | Admitting: Neurology

## 2019-08-12 ENCOUNTER — Telehealth: Payer: Self-pay

## 2019-08-12 VITALS — BP 103/70 | HR 61 | Ht 63.8 in | Wt 159.5 lb

## 2019-08-12 DIAGNOSIS — IMO0002 Reserved for concepts with insufficient information to code with codable children: Secondary | ICD-10-CM

## 2019-08-12 DIAGNOSIS — R5383 Other fatigue: Secondary | ICD-10-CM | POA: Diagnosis not present

## 2019-08-12 DIAGNOSIS — R42 Dizziness and giddiness: Secondary | ICD-10-CM

## 2019-08-12 DIAGNOSIS — Z79899 Other long term (current) drug therapy: Secondary | ICD-10-CM | POA: Diagnosis not present

## 2019-08-12 DIAGNOSIS — G35 Multiple sclerosis: Secondary | ICD-10-CM

## 2019-08-12 DIAGNOSIS — R569 Unspecified convulsions: Secondary | ICD-10-CM

## 2019-08-12 DIAGNOSIS — G43709 Chronic migraine without aura, not intractable, without status migrainosus: Secondary | ICD-10-CM

## 2019-08-12 MED ORDER — RIZATRIPTAN BENZOATE 10 MG PO TBDP
10.0000 mg | ORAL_TABLET | ORAL | 3 refills | Status: DC | PRN
Start: 2019-08-12 — End: 2021-01-09

## 2019-08-12 MED ORDER — TAMSULOSIN HCL 0.4 MG PO CAPS: 0.4000 mg | ORAL_CAPSULE | Freq: Every day | ORAL | 4 refills | Status: DC

## 2019-08-12 MED ORDER — NITROFURANTOIN MACROCRYSTAL 100 MG PO CAPS: 100.0000 mg | ORAL_CAPSULE | Freq: Two times a day (BID) | ORAL | 1 refills | Status: DC

## 2019-08-12 NOTE — Telephone Encounter (Signed)
error 

## 2019-08-12 NOTE — Progress Notes (Signed)
[facility]  PATIENT: Kelly Alvarado DOB: [birth year]  REFERRING DOCTOR OR PCP:  [name]  SOURCE: patient and records  _________________________________   HISTORICAL  CHIEF COMPLAINT:  Chief Complaint  Patient presents with  . Follow-up    RM 13, alone. Last seen 03/04/2019. She is feeling faint often. This started after she started on Ocrevus.   . Multiple Sclerosis    On Ocrevus. Last infusion: 05/11/2019. Next infusion: 11/26/2019. Infused at [facility] w/ intrafusion.    HISTORY OF PRESENT ILLNESS:  Kelly Alvarado is a 44 year old woman with relapsing remitting MS diagnosed in 2010 and h/o a seizure in 2017.  Update 08/12/2019: Her last Ocrevus infusion was 05/11/2019.    Since starting Ocrevus, she has felt lightheaded.    She would like to go back on Gilenya or similar medication.   We discussed Gilenya, Mayzent and Zeposia.   She would like to go on Mayzent.    EKG 10/09/2018 was normal.   PR was 0.15.   She has a recent eye exam.     No LOC spells or seizures since last visit.    Chronic migraines are doing much better on Emgality.   She has breakthrough once or twice a month and then several the last week of each cycle - compared to 25+ days a month before Emgality.    Maxalt had helped in past but is no longer on it.   In past tried topiramate, zonisamide, nortriptyline and NSAIDs without much benefit.    She has had several UTI's this year.   She has urinary hesitancy and incomplete emptying.    She has a brain fog.   This was much better when she was on Tysabri.   Unfortunately, due to a very high JCV Ab titer (3.36) she needed to stop Tysabri    MS History:   She was diagnosed with multiple sclerosis in 2010 after presenting with a left foot drop. MRI of the brain was consistent with multiple sclerosis.   She was  started on Rebif. The MS was well controlled but she felt poorly on that medication. She transferred to [facility] and began to see  me in 2012. She was placed on Tysabri for about a year and felt much better while on it. Her MS did well on Tysabri as well.  Unfortunately, she was JCV positive and after about a year she stopped Tysabri and started Gilenya. She has done fairly well on Gilenya with no definite exacerbations.      REVIEW OF SYSTEMS: Constitutional: No fevers, chills, sweats, or change in appetite.  She notes fatigue and insomnia. Eyes: No visual changes, double vision, eye pain Ear, nose and throat: No hearing loss, ear pain, nasal congestion, sore throat Cardiovascular: No chest pain, palpitations Respiratory: No shortness of breath at rest or with exertion.   No wheezes GastrointestinaI: No nausea, vomiting, diarrhea, abdominal pain, fecal incontinence Genitourinary: No dysuria, urinary retention or frequency.  No nocturia. Musculoskeletal: No neck pain, back .  Notes myalgias at times Integumentary: No rash, pruritus, skin lesions Neurological: as above Psychiatric: No depression at this time.  No anxiety Endocrine: No palpitations, diaphoresis, change in appetite, change in weigh or increased thirst Hematologic/Lymphatic: No anemia, purpura, petechiae. Allergic/Immunologic: No itchy/runny eyes, nasal congestion, recent allergic reactions, rashes.  She has some food allergies.  ALLERGIES: Allergies  Allergen Reactions  . Amoxicillin Hives    Has patient had a PCN reaction causing immediate rash, facial/tongue/throat swelling, SOB  or lightheadedness with hypotension: Yes Has patient had a PCN reaction causing severe rash involving mucus membranes or skin necrosis: Yes Has patient had a PCN reaction that required hospitalization No Has patient had a PCN reaction occurring within the last 10 years: No If all of the above answers are "NO", then may proceed with Cephalosporin use.   . Milk-Related Compounds Diarrhea and Nausea And Vomiting    Stomach cramps    HOME MEDICATIONS:  Current  Outpatient Medications:  .  amphetamine-dextroamphetamine (ADDERALL XR) 25 MG 24 hr capsule, Take 1 capsule by mouth daily., Disp: 30 capsule, Rfl: 0 .  BD PEN NEEDLE NANO U/F 32G X 4 MM MISC, USE AS DIRECTED, Disp: 30 each, Rfl: 5 .  dicyclomine (BENTYL) 20 MG tablet, Take 20 mg by mouth 2 (two) times daily., Disp: , Rfl:  .  gabapentin (NEURONTIN) 300 MG capsule, TAKE 1 CAPSULE IN AM, AND 1 CAP IN EVENING, AND 2 AT BEDTIME, Disp: 360 capsule, Rfl: 0 .  Galcanezumab-gnlm (EMGALITY) 120 MG/ML SOAJ, Inject 1 pen into the skin every 28 (twenty-eight) days., Disp: 3 pen, Rfl: 4 .  levocetirizine (XYZAL) 5 MG tablet, TAKE 1 TABLET BY MOUTH EVERY DAY EVERY EVENING, Disp: 90 tablet, Rfl: 1 .  ocrelizumab (OCREVUS) 300 MG/10ML injection, Inject 600 mg into the vein every 6 (six) months., Disp: , Rfl:  .  SAXENDA 18 MG/3ML SOPN, INJECT 3 MG INTO THE SKIN DAILY., Disp: 15 pen, Rfl: 1 .  triamcinolone cream (KENALOG) 0.1 %, Apply sparingly to the face daily, Disp: 45 g, Rfl: 0 .  Vitamin D, Ergocalciferol, (DRISDOL) 1.25 MG (50000 UNIT) CAPS capsule, Take 1 capsule (50,000 Units total) by mouth 2 (two) times a week., Disp: 26 capsule, Rfl: 1 .  zolpidem (AMBIEN) 5 MG tablet, Take 1 tablet (5 mg total) by mouth at bedtime as needed for sleep., Disp: 30 tablet, Rfl: 2 .  nitrofurantoin (MACRODANTIN) 100 MG capsule, Take 1 capsule (100 mg total) by mouth 2 (two) times daily., Disp: 14 capsule, Rfl: 1 .  rizatriptan (MAXALT-MLT) 10 MG disintegrating tablet, Take 1 tablet (10 mg total) by mouth as needed for migraine. May repeat in 2 hours if needed, Disp: 27 tablet, Rfl: 3 .  tamsulosin (FLOMAX) 0.4 MG CAPS capsule, Take 1 capsule (0.4 mg total) by mouth daily., Disp: 90 capsule, Rfl: 4  PAST MEDICAL HISTORY: Past Medical History:  Diagnosis Date  . Allergy   . Anemia    HEAVY PERIODS  . Headache(784.0)    MIGRAINES  . Multiple sclerosis ([place])   . Neuromuscular disorder ([place])    MS  . Pulmonary embolism  ([place]) 2013   lt-?bcp  . Wears glasses     PAST SURGICAL HISTORY: Past Surgical History:  Procedure Laterality Date  . BILATERAL SALPINGECTOMY Bilateral 12/27/2015   Procedure: BILATERAL SALPINGECTOMY;  Surgeon: Everett Graff, MD;  Location: [facility];  Service: Gynecology;  Laterality: Bilateral;  Excision of paratubal cyst  . BREAST LUMPECTOMY WITH RADIOACTIVE SEED LOCALIZATION Left 10/06/2013   Procedure:  RADIOACTIVE SEED LOCALIZATION LEFT BREAST LUMPECTOMY;  Surgeon: Odis Hollingshead, MD;  Location: [facility];  Service: General;  Laterality: Left;  . BREAST REDUCTION SURGERY    . CYSTOSCOPY N/A 12/27/2015   Procedure: CYSTOSCOPY;  Surgeon: Everett Graff, MD;  Location: [facility];  Service: Gynecology;  Laterality: N/A;  . REPAIR VAGINAL CUFF N/A 01/09/2016   Procedure: REPAIR VAGINAL CUFF;  Surgeon: Everett Graff, MD;  Location: [facility];  Service: Gynecology;  Laterality: N/A;  . VAGINAL HYSTERECTOMY Bilateral 12/27/2015   Procedure: HYSTERECTOMY VAGINAL;  Surgeon: Everett Graff, MD;  Location: [facility];  Service: Gynecology;  Laterality: Bilateral;  . WISDOM TOOTH EXTRACTION      FAMILY HISTORY: Family History  Problem Relation Age of Onset  . Asthma Mother   . Hypertension Father   . Cancer Paternal Uncle   . Cancer Paternal Grandfather     SOCIAL HISTORY:  Social History   Socioeconomic History  . Marital status: Married    Spouse name: Not on file  . Number of children: Not on file  . Years of education: Not on file  . Highest education level: Not on file  Occupational History  . Not on file  Tobacco Use  . Smoking status: Never Smoker  . Smokeless tobacco: Never Used  Substance and Sexual Activity  . Alcohol use: Yes    Alcohol/week: 0.0 standard drinks    Comment: occ  . Drug use: No  . Sexual activity: Yes    Birth control/protection: None    Comment: VASECTOMY  Other Topics Concern  . Not on file  Social History Narrative   Right handed    Caffeine use: sometimes   Social Determinants of Health   Financial Resource Strain:   . Difficulty of Paying Living Expenses:   Food Insecurity:   . Worried About Charity fundraiser in the Last Year:   . Arboriculturist in the Last Year:   Transportation Needs:   . Film/video editor (Medical):   Marland Kitchen Lack of Transportation (Non-Medical):   Physical Activity:   . Days of Exercise per Week:   . Minutes of Exercise per Session:   Stress:   . Feeling of Stress :   Social Connections:   . Frequency of Communication with Friends and Family:   . Frequency of Social Gatherings with Friends and Family:   . Attends Religious Services:   . Active Member of Clubs or Organizations:   . Attends Archivist Meetings:   Marland Kitchen Marital Status:   Intimate Partner Violence:   . Fear of Current or Ex-Partner:   . Emotionally Abused:   Marland Kitchen Physically Abused:   . Sexually Abused:      PHYSICAL EXAM  Vitals:   08/12/19 0857  BP: 103/70  Pulse: 61  Weight: 159 lb 8 oz (72.3 kg)  Height: 5' 3.8" (1.621 m)    Body mass index is 27.55 kg/m.   General: The patient is well-developed and well-nourished and in no acute distress   Musculoskeletal:  Neck has good ROM.    Neurologic Exam  Mental status: The patient is alert and oriented x 3 at the time of the examination. The patient has apparent normal recent and remote memory, with an apparently normal attention span and concentration ability.   Speech is normal.  Cranial nerves: Extraocular movements are full.  Facial strength is normal.  Trapezius strength was normal.. No obvious hearing deficits are noted.  Motor:  Muscle bulk is normal.   Tone is normal. Strength is  5 / 5 in all 4 extremities except 4+ in the left toe extensors  Sensory: She has intact sensation to touch and vibration in the arms and legs.  Coordination: Cerebellar testing reveals good finger-nose-finger and heel-to-shin bilaterally.  Gait and station:  Station is normal.   Gait is normal.   Tandem gait is mildly wide.. Romberg is negative.   Reflexes: Deep  tendon reflexes are symmetric and normal bilaterally.        DIAGNOSTIC DATA (LABS, IMAGING, TESTING) - I reviewed patient records, labs, notes, testing and imaging myself where available.  Lab Results  Component Value Date   WBC 4.2 04/21/2019   HGB 11.7 04/21/2019   HCT 33.8 (L) 04/21/2019   MCV 94 04/21/2019   PLT 279 04/21/2019        ASSESSMENT AND PLAN    1. Multiple sclerosis ([place])   2. High risk medication use   3. Convulsions, unspecified convulsion type ([place])   4. Other fatigue   5. Chronic migraine   6. Dizzy spells      1.   She would like to switch from [place] to [place].  Check labs.  Has EKG in last 12 months.  Has recent ophth exam.  In the past she had been on Gilenya and had some progression and falls.   We discussed pros and cons.  She did best on Tysabri but is JCV high positive.  We would start in October, 6 months after last Ocrevus.   2.   Check VZV IgG, LFT, CBC and IgG/IgM 3.   continue gabapentin for the dysesthesias. 4.   Continue Adderall for ADD 5.   Emgality for chronic migraine   Maxalt for brekthrough, if not better consider Nurtec or Ubrelvy 6.    Tamsulosin for urinary hesitancy. 7.    she will return in 6 months or sooner if there are new or worsening neurologic symptoms.  45-minute office visit with the majority of the time spent face-to-face for history and physical, discussion/counseling and decision-making.  Additional time with record review and documentation.   Marlyce Mcdougald A. Felecia Shelling, MD, PhD 05/07/6382, 53:64 AM Certified in Neurology, Clinical Neurophysiology, Sleep Medicine, Pain Medicine and Neuroimaging  [facility] [address], [place], [place] [address] [phone]

## 2019-08-12 NOTE — Telephone Encounter (Signed)
Placed JCV lab in quest lock box for routine lab pick up. Results pending. 

## 2019-08-13 ENCOUNTER — Ambulatory Visit: Payer: Self-pay | Admitting: Neurology

## 2019-08-18 NOTE — Telephone Encounter (Signed)
JCV ab drawn on 08/12/19 positive, index: 4.40. Gave to MD to review.

## 2019-08-19 ENCOUNTER — Telehealth: Payer: Self-pay | Admitting: *Deleted

## 2019-08-19 LAB — CBC WITH DIFFERENTIAL/PLATELET
Basophils Absolute: 0 10*3/uL (ref 0.0–0.2)
Basos: 1 %
EOS (ABSOLUTE): 0.1 10*3/uL (ref 0.0–0.4)
Eos: 1 %
Hematocrit: 36 % (ref 34.0–46.6)
Hemoglobin: 12.4 g/dL (ref 11.1–15.9)
Immature Grans (Abs): 0 10*3/uL (ref 0.0–0.1)
Immature Granulocytes: 0 %
Lymphocytes Absolute: 0.7 10*3/uL (ref 0.7–3.1)
Lymphs: 12 %
MCH: 32.3 pg (ref 26.6–33.0)
MCHC: 34.4 g/dL (ref 31.5–35.7)
MCV: 94 fL (ref 79–97)
Monocytes Absolute: 0.5 10*3/uL (ref 0.1–0.9)
Monocytes: 8 %
Neutrophils Absolute: 4.6 10*3/uL (ref 1.4–7.0)
Neutrophils: 78 %
Platelets: 292 10*3/uL (ref 150–450)
RBC: 3.84 x10E6/uL (ref 3.77–5.28)
RDW: 12.6 % (ref 11.7–15.4)
WBC: 5.8 10*3/uL (ref 3.4–10.8)

## 2019-08-19 LAB — HEPATIC FUNCTION PANEL
ALT: 8 IU/L (ref 0–32)
AST: 14 IU/L (ref 0–40)
Albumin: 4.7 g/dL (ref 3.8–4.8)
Alkaline Phosphatase: 76 IU/L (ref 48–121)
Bilirubin Total: 0.6 mg/dL (ref 0.0–1.2)
Bilirubin, Direct: 0.14 mg/dL (ref 0.00–0.40)
Total Protein: 7 g/dL (ref 6.0–8.5)

## 2019-08-19 LAB — CYP2C9 GENOTYPING SIPONIMOD

## 2019-08-19 LAB — VARICELLA ZOSTER ANTIBODY, IGG: Varicella zoster IgG: 1038 index (ref >165)

## 2019-08-19 NOTE — Telephone Encounter (Signed)
-----   Message from Britt Bottom, MD sent at 08/19/2019  4:36 PM EDT ----- Please let the patient know that the lab work is fine to start [place] I put the SRF on the counter

## 2019-08-19 NOTE — Telephone Encounter (Signed)
I spoke to the patient and she verbalized understanding of the findings. She is aware we will get her Mayzent start form faxed in for her.

## 2019-08-24 NOTE — Telephone Encounter (Signed)
Mayzent Start Form faxed to [employer]. Received a receipt of confirmation.

## 2019-08-25 NOTE — Telephone Encounter (Signed)
Started PA for [employer] via covermymeds. Key: [id number]

## 2019-08-25 NOTE — Telephone Encounter (Signed)
I informed Hailey, case manager for Mayzent of this approval. They are still in the process of benefits investigation for this pt.

## 2019-08-25 NOTE — Telephone Encounter (Signed)
I called [employer]. They will be sending the starter pack to the pt and the first free month supply of the 2mg  tablet PO QD. The maintenance supply of mayzent may have to come from another specialty pharmacy.

## 2019-08-25 NOTE — Telephone Encounter (Signed)
Received notice from [employer] [employer] that PA for mayzent has been approved.  "[employer] received a request from your prescriber for coverage of Mayzent. As long as you remain covered by the [facility] and there are no changes to your plan benefits, this request is approved for the following time period: 08/25/2019 - 08/24/2020 PA# [place] [id number] JF"

## 2019-08-25 NOTE — Telephone Encounter (Signed)
Emil(Pharmacy tech) [phone] from [facility] has the starter pack for Mayzent. Would like to know the when the approval will be sent.Marland Kitchen

## 2019-08-25 NOTE — Telephone Encounter (Signed)
Completed PA via covermymeds, sent to [facility]. "Your information has been submitted to [place]. To check for an updated outcome later, reopen this PA request from your dashboard.  If [employer] has not responded to your request within 24 hours, contact [place] at [phone]. If you think there may be a problem with your PA request, use our live chat feature at the bottom right."

## 2019-09-15 ENCOUNTER — Other Ambulatory Visit: Payer: Self-pay | Admitting: Nurse Practitioner

## 2019-09-15 DIAGNOSIS — E663 Overweight: Secondary | ICD-10-CM

## 2019-09-21 ENCOUNTER — Other Ambulatory Visit: Payer: Self-pay | Admitting: Neurology

## 2019-10-07 ENCOUNTER — Telehealth: Payer: Self-pay | Admitting: Neurology

## 2019-10-07 NOTE — Telephone Encounter (Addendum)
Called pt to further discuss. She does not want to switch from [place] to [place]. She would rather stay on Ocrevus therapy d/t not wanting to take a daily pill. She would be due for Ocreuvs next around 11/26/19 as her last infusion was 05/11/19. I messaged Liane, RN in infusion suite to see if they can call to get her scheduled. Made Dr. Felecia Shelling aware, he is okay with plan  Advised pt I can call Mayzent support to let them know she has decided not to proceed with therapy. . I called Mayzent support program at [phone] and spoke with [facility]. They will close out pt case. If pt changes her mind in the future, we can call and reopen her case at that time.

## 2019-10-07 NOTE — Telephone Encounter (Signed)
Pt does not want to transition from [place] to [place]. Pt would like call from the nurse.

## 2019-10-07 NOTE — Telephone Encounter (Signed)
Per Liane,RN she has appt scheduled for Ocrevus 11/26/19. She tried calling pt, unable to reach her. She will try reaching her again later.

## 2019-11-24 ENCOUNTER — Other Ambulatory Visit: Payer: Self-pay | Admitting: Neurology

## 2019-11-24 MED ORDER — AMPHETAMINE-DEXTROAMPHET ER 25 MG PO CP24: 25.0000 mg | ORAL_CAPSULE | Freq: Every day | ORAL | 0 refills | Status: DC

## 2019-11-24 NOTE — Addendum Note (Signed)
Addended by: Wyvonnia Lora on: 11/24/2019 09:22 AM   Modules accepted: Orders

## 2019-11-24 NOTE — Telephone Encounter (Signed)
Pt is requesting a refill for amphetamine-dextroamphetamine (ADDERALL XR) 25 MG 24 hr capsule  .  Pharmacy: [id number]

## 2020-02-10 ENCOUNTER — Telehealth: Payer: Self-pay | Admitting: Neurology

## 2020-02-10 NOTE — Telephone Encounter (Signed)
..   Pt understands that although there may be some limitations with this type of visit, we will take all precautions to reduce any security or privacy concerns.  Pt understands that this will be treated like an in office visit and we will file with pt's insurance, and there may be a patient responsible charge related to this service. ? ?

## 2020-02-11 ENCOUNTER — Telehealth: Payer: Self-pay | Admitting: Neurology

## 2020-02-11 ENCOUNTER — Telehealth (INDEPENDENT_AMBULATORY_CARE_PROVIDER_SITE_OTHER): Payer: BC Managed Care – PPO | Admitting: Neurology

## 2020-02-11 ENCOUNTER — Encounter: Payer: Self-pay | Admitting: Neurology

## 2020-02-11 ENCOUNTER — Encounter: Payer: Self-pay | Admitting: Nurse Practitioner

## 2020-02-11 DIAGNOSIS — R569 Unspecified convulsions: Secondary | ICD-10-CM

## 2020-02-11 DIAGNOSIS — G35 Multiple sclerosis: Secondary | ICD-10-CM | POA: Diagnosis not present

## 2020-02-11 DIAGNOSIS — U071 COVID-19: Secondary | ICD-10-CM

## 2020-02-11 DIAGNOSIS — R5383 Other fatigue: Secondary | ICD-10-CM

## 2020-02-11 DIAGNOSIS — Z79899 Other long term (current) drug therapy: Secondary | ICD-10-CM

## 2020-02-11 MED ORDER — INDOMETHACIN 25 MG PO CAPS: 25.0000 mg | ORAL_CAPSULE | Freq: Three times a day (TID) | ORAL | 0 refills | Status: DC | PRN

## 2020-02-11 NOTE — Telephone Encounter (Signed)
Called pt to schedule f/u from today's VV, she stated she would prefer to call us back to make this appointment after looking at her schedule.

## 2020-02-11 NOTE — Telephone Encounter (Signed)
Called pt. Updated chart for mychart VV at 10am with Md.

## 2020-02-11 NOTE — Addendum Note (Signed)
Addended by: Arther Abbott on: 02/11/2020 09:35 AM   Modules accepted: Orders

## 2020-02-11 NOTE — Progress Notes (Addendum)
[facility]  PATIENT: Kelly Alvarado DOB: 1975-11-20  REFERRING DOCTOR OR PCP:  [name]  SOURCE: patient and records  _________________________________   HISTORICAL  CHIEF COMPLAINT:  Chief Complaint  Patient presents with  . Multiple Sclerosis  . Covid Positive    HISTORY OF PRESENT ILLNESS:  Kelly Alvarado is a 45 year old woman with relapsing remitting MS diagnosed in 2010 and h/o a seizure in 2017.  Virtual Visit via Video Note I connected with@ on 02/11/20 at 10:00 AM EST by a video enabled telemedicine application and verified that I am speaking with the correct person.  I discussed the limitations of evaluation and management by telemedicine and the availability of in person appointments. The patient expressed understanding and agreed to proceed.  Patient was at home. Provider was in office.  History of Present Illness: Update 02/11/2020: She was diagnosed with Covid-19 earlier this week.   Her test 02/09/2020 came back positive.  She has not yet discussed with her PCP.   She is currently having headache, sore throat, low grade fever.  She denies much nausea and no vomiting.      Her last Ocrevus infusion was October 2021.    Since starting Ocrevus, she has felt lightheaded.    She is interested in switching to another medication.      Chronic migraines are doing much better on Emgality.   This has worked better than any other medicationShe has breakthrough once or twice a month and then several the last week of each cycle - compared to 25+ days a month before [employer].    Maxalt had helped in past but is no longer on it.   In past tried topiramate, zonisamide, nortriptyline and NSAIDs without much benefit.    She has no recent UTI.   She has urinary hesitancy and incomplete emptying.    She has a brain fog.   This was much better when she was on Tysabri.   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   No LOC  spells or seizures since last visit.      MS History:    She was diagnosed with multiple sclerosis in 2010 after presenting with a left foot drop. MRI of the brain was consistent with multiple sclerosis.   She was  started on Rebif. The MS was well controlled but she felt poorly on that medication. She transferred to [facility] and began to see me in 2012. She was placed on Tysabri for about a year and felt much better while on it. Her MS did well on Tysabri as well.  Unfortunately, she was JCV positive and after about a year she stopped Tysabri and started Gilenya. She has done fairly well on Gilenya with no definite exacerbations.    IMAGING MRI brain 07/01/2018 shows multiple T2/flair hyperintense foci in the spinal cord, left middle cerebellar peduncle and in the periventricular, juxtacortical and deep white matter of the hemispheres.  These are consistent with chronic demyelinating plaque associated with multiple sclerosis.  None of the foci enhances or appears to be acute.  However, the 2 foci in the spinal cord and several of the foci in the hemispheres were not present on the 2012 MRI.   There is a normal enhancement pattern and no acute findings.  MRI cervical spine 07/01/2018 shows T2 hyperintense foci within the spinal cord adjacent to C1-C2, C3-C4, C5 and T3.  These are consistent with chronic demyelinating  plaque associated with multiple sclerosis.  None of the foci enhanced on the current study.  However, none of the 4 foci were noted on the MRI from 2010    Observations/Objective: She is a well-developed well-nourished woman in no acute distress.  She appears tired.  The head is normocephalic and atraumatic.  Sclera are anicteric.  Visible skin appears normal.  The neck has a good range of motion.  .  She is alert and fully oriented with fluent speech and good attention, knowledge and memory.  Extraocular muscles are intact.  Facial strength is normal.  She appears to have normal  strength in the arms.  Rapid alternating movements and finger-nose-finger are performed well.   REVIEW OF SYSTEMS: Constitutional: No fevers, chills, sweats, or change in appetite.  She notes fatigue and insomnia. Eyes: No visual changes, double vision, eye pain Ear, nose and throat: No hearing loss, ear pain, nasal congestion, sore throat Cardiovascular: No chest pain, palpitations Respiratory: No shortness of breath at rest or with exertion.   No wheezes GastrointestinaI: No nausea, vomiting, diarrhea, abdominal pain, fecal incontinence Genitourinary: No dysuria, urinary retention or frequency.  No nocturia. Musculoskeletal: No neck pain, back .  Notes myalgias at times Integumentary: No rash, pruritus, skin lesions Neurological: as above Psychiatric: No depression at this time.  No anxiety Endocrine: No palpitations, diaphoresis, change in appetite, change in weigh or increased thirst Hematologic/Lymphatic: No anemia, purpura, petechiae. Allergic/Immunologic: No itchy/runny eyes, nasal congestion, recent allergic reactions, rashes.  She has some food allergies.  ALLERGIES: Allergies  Allergen Reactions  . Amoxicillin Hives    Has patient had a PCN reaction causing immediate rash, facial/tongue/throat swelling, SOB or lightheadedness with hypotension: Yes Has patient had a PCN reaction causing severe rash involving mucus membranes or skin necrosis: Yes Has patient had a PCN reaction that required hospitalization No Has patient had a PCN reaction occurring within the last 10 years: No If all of the above answers are "NO", then may proceed with Cephalosporin use.   . Milk-Related Compounds Diarrhea and Nausea And Vomiting    Stomach cramps    HOME MEDICATIONS:  Current Outpatient Medications:  .  indomethacin (INDOCIN) 25 MG capsule, Take 1 capsule (25 mg total) by mouth 3 (three) times daily as needed., Disp: 30 capsule, Rfl: 0 .  amphetamine-dextroamphetamine (ADDERALL XR)  25 MG 24 hr capsule, Take 1 capsule by mouth daily., Disp: 30 capsule, Rfl: 0 .  BD PEN NEEDLE NANO U/F 32G X 4 MM MISC, USE AS DIRECTED, Disp: 30 each, Rfl: 5 .  dicyclomine (BENTYL) 20 MG tablet, Take 20 mg by mouth as needed., Disp: , Rfl:  .  gabapentin (NEURONTIN) 300 MG capsule, TAKE 1 CAPSULE IN AM, AND 1 CAP IN EVENING, AND 2 AT BEDTIME, Disp: 360 capsule, Rfl: 0 .  Galcanezumab-gnlm (EMGALITY) 120 MG/ML SOAJ, Inject 1 pen into the skin every 28 (twenty-eight) days., Disp: 3 pen, Rfl: 4 .  levocetirizine (XYZAL) 5 MG tablet, TAKE 1 TABLET BY MOUTH EVERY DAY EVERY EVENING, Disp: 90 tablet, Rfl: 1 .  ocrelizumab (OCREVUS) 300 MG/10ML injection, Inject 600 mg into the vein every 6 (six) months., Disp: , Rfl:  .  rizatriptan (MAXALT-MLT) 10 MG disintegrating tablet, Take 1 tablet (10 mg total) by mouth as needed for migraine. May repeat in 2 hours if needed, Disp: 27 tablet, Rfl: 3 .  Vitamin D, Ergocalciferol, (DRISDOL) 1.25 MG (50000 UNIT) CAPS capsule, Take 1 capsule (50,000 Units total) by mouth 2 (two)  times a week., Disp: 26 capsule, Rfl: 1  PAST MEDICAL HISTORY: Past Medical History:  Diagnosis Date  . Allergy   . Anemia    HEAVY PERIODS  . Headache(784.0)    MIGRAINES  . Multiple sclerosis (HCC)   . Neuromuscular disorder (HCC)    MS  . Pulmonary embolism (HCC) 2013   lt-?bcp  . Wears glasses     PAST SURGICAL HISTORY: Past Surgical History:  Procedure Laterality Date  . BILATERAL SALPINGECTOMY Bilateral 12/27/2015   Procedure: BILATERAL SALPINGECTOMY;  Surgeon: Osborn Coho, MD;  Location: [facility];  Service: Gynecology;  Laterality: Bilateral;  Excision of paratubal cyst  . BREAST LUMPECTOMY WITH RADIOACTIVE SEED LOCALIZATION Left 10/06/2013   Procedure:  RADIOACTIVE SEED LOCALIZATION LEFT BREAST LUMPECTOMY;  Surgeon: Adolph Pollack, MD;  Location: [facility];  Service: General;  Laterality: Left;  . BREAST REDUCTION SURGERY    . CYSTOSCOPY N/A  12/27/2015   Procedure: CYSTOSCOPY;  Surgeon: Osborn Coho, MD;  Location: [facility];  Service: Gynecology;  Laterality: N/A;  . REPAIR VAGINAL CUFF N/A 01/09/2016   Procedure: REPAIR VAGINAL CUFF;  Surgeon: Osborn Coho, MD;  Location: [facility];  Service: Gynecology;  Laterality: N/A;  . VAGINAL HYSTERECTOMY Bilateral 12/27/2015   Procedure: HYSTERECTOMY VAGINAL;  Surgeon: Osborn Coho, MD;  Location: [facility];  Service: Gynecology;  Laterality: Bilateral;  . WISDOM TOOTH EXTRACTION      FAMILY HISTORY: Family History  Problem Relation Age of Onset  . Asthma Mother   . Hypertension Father   . Cancer Paternal Uncle   . Cancer Paternal Grandfather        DIAGNOSTIC DATA (LABS, IMAGING, TESTING) - I reviewed patient records, labs, notes, testing and imaging myself where available.  Lab Results  Component Value Date   WBC 5.8 08/12/2019   HGB 12.4 08/12/2019   HCT 36.0 08/12/2019   MCV 94 08/12/2019   PLT 292 08/12/2019        ASSESSMENT AND PLAN    1. Multiple sclerosis (HCC)   2. COVID-19   3. High risk medication use   4. Convulsions, unspecified convulsion type (HCC)   5. Other fatigue      1.   She would like to switch from Ocrevus.   We discussed pros/cons of several medications and she is potentially interested in [facility] I would wait to at least April and probably May before making the actual switch of medications dated to the long-term effects of Ocrevus. 2.   Check labs once over Covid 3.   continue gabapentin for the dysesthesias.  Tamsulosin for urinary hesitancy 4.   Continue Adderall for ADD 5.   Continue Emgality for chronic migraine   Maxalt for breakthrough 6.   She is not certain she can get in with PCP this week.  I will send in a referral to the call monoclonal antibody clinic.  Due to her immunocompromised state (she is on Ocrevus for her MS that depletes B cells and this medication has been associated with higher risks of pneumonia doing Covid  infections) I believe she should get the monoclonal antibodies if possible to reduce the risk of hospitalization. 7.    she will return in 6 weeks or sooner if there are new or worsening neurologic symptoms.   Follow Up Instructions: I discussed the assessment and treatment plan with the patient. The patient was provided an opportunity to ask questions and all were answered. The patient agreed with the plan and demonstrated  an understanding of the instructions.    The patient was advised to call back or seek an in-person evaluation if the symptoms worsen or if the condition fails to improve as anticipated.  I provided 35 minutes of non-face-to-face time during this encounter.   Jaquitta Dupriest A. Felecia Shelling, MD, PhD [id number], [id number] PM Certified in Neurology, Clinical Neurophysiology, Sleep Medicine, Pain Medicine and Neuroimaging  [facility] [address], [place], [place] [address] [phone]

## 2020-02-12 ENCOUNTER — Encounter: Payer: Self-pay | Admitting: Nurse Practitioner

## 2020-02-12 ENCOUNTER — Telehealth (INDEPENDENT_AMBULATORY_CARE_PROVIDER_SITE_OTHER): Payer: BC Managed Care – PPO | Admitting: Nurse Practitioner

## 2020-02-12 DIAGNOSIS — U071 COVID-19: Secondary | ICD-10-CM | POA: Diagnosis not present

## 2020-02-12 MED ORDER — AZITHROMYCIN 250 MG PO TABS: ORAL_TABLET | ORAL | 0 refills | Status: AC

## 2020-02-12 NOTE — Progress Notes (Addendum)
Virtual Visit via televisit via phone due to patient phone technical issues with video    [employer] as a Education administrator for [employer], NP.,have documented all relevant documentation on the behalf of [employer], NP,as directed by  Bary Castilla, NP while in the presence of Bary Castilla, NP. This visit type was conducted due to national recommendations for restrictions regarding the COVID-19 Pandemic (e.g. social distancing) in an effort to limit this patient's exposure and mitigate transmission in our community.  Due to her co-morbid illnesses, this patient is at least at moderate risk for complications without adequate follow up.  This format is felt to be most appropriate for this patient at this time.  All issues noted in this document were discussed and addressed.  A limited physical exam was performed with this format.    This visit type was conducted due to national recommendations for restrictions regarding the COVID-19 Pandemic (e.g. social distancing) in an effort to limit this patient's exposure and mitigate transmission in our community.  Patients identity confirmed using two different identifiers.  This format is felt to be most appropriate for this patient at this time.  All issues noted in this document were discussed and addressed.  No physical exam was performed (except for noted visual exam findings with Video Visits).    Date:  02/12/2020   ID:  Kelly Alvarado, DOB [birth year], MRN [id number]  Patient Location:  Home   Provider location:   Office    Chief Complaint:  COVID positive   History of Present Illness:    Kelly Alvarado is a 45 y.o. female who presents via video conferencing for a telehealth visit today.  (phone call due to patient's phone not working)   The patient has symptoms concerning for COVID-19 infection as listed in ROS   Patient has been coughing a lot and has been having bodyaches. Sore throat, chest hurts from  coughing. Headache. Horse voice. No fever currently. Bodyaches. Symptoms started Monday and got tested positive on Tuesday. Neurologist gave medicine for headache.  She has MS so she does see a neurologist for it.   Occasional congestion when she is laying down. She is vaccinated and has a booster. No shortness of breath.     Past Medical History:  Diagnosis Date  . Allergy   . Anemia    HEAVY PERIODS  . Headache(784.0)    MIGRAINES  . Multiple sclerosis ([place])   . Neuromuscular disorder ([place])    MS  . Pulmonary embolism ([place]) 2013   lt-?bcp  . Wears glasses    Past Surgical History:  Procedure Laterality Date  . BILATERAL SALPINGECTOMY Bilateral 12/27/2015   Procedure: BILATERAL SALPINGECTOMY;  Surgeon: Everett Graff, MD;  Location: [facility];  Service: Gynecology;  Laterality: Bilateral;  Excision of paratubal cyst  . BREAST LUMPECTOMY WITH RADIOACTIVE SEED LOCALIZATION Left 10/06/2013   Procedure:  RADIOACTIVE SEED LOCALIZATION LEFT BREAST LUMPECTOMY;  Surgeon: Odis Hollingshead, MD;  Location: [facility];  Service: General;  Laterality: Left;  . BREAST REDUCTION SURGERY    . CYSTOSCOPY N/A 12/27/2015   Procedure: CYSTOSCOPY;  Surgeon: Everett Graff, MD;  Location: [facility];  Service: Gynecology;  Laterality: N/A;  . REPAIR VAGINAL CUFF N/A 01/09/2016   Procedure: REPAIR VAGINAL CUFF;  Surgeon: Everett Graff, MD;  Location: [facility];  Service: Gynecology;  Laterality: N/A;  . VAGINAL HYSTERECTOMY Bilateral 12/27/2015   Procedure: HYSTERECTOMY VAGINAL;  Surgeon: Everett Graff, MD;  Location: [facility];  Service: Gynecology;  Laterality: Bilateral;  . WISDOM TOOTH EXTRACTION       Current Meds  Medication Sig  . amphetamine-dextroamphetamine (ADDERALL XR) 25 MG 24 hr capsule Take 1 capsule by mouth daily.  Marland Kitchen azithromycin (ZITHROMAX Z-PAK) 250 MG tablet Take 2 tablets (500 mg) on  Day 1,  followed by 1 tablet (250 mg) once daily on Days 2 through 5.  . BD PEN NEEDLE NANO U/F  32G X 4 MM MISC USE AS DIRECTED  . gabapentin (NEURONTIN) 300 MG capsule TAKE 1 CAPSULE IN AM, AND 1 CAP IN EVENING, AND 2 AT BEDTIME  . Galcanezumab-gnlm (EMGALITY) 120 MG/ML SOAJ Inject 1 pen into the skin every 28 (twenty-eight) days.  . indomethacin (INDOCIN) 25 MG capsule Take 1 capsule (25 mg total) by mouth 3 (three) times daily as needed.  Marland Kitchen levocetirizine (XYZAL) 5 MG tablet TAKE 1 TABLET BY MOUTH EVERY DAY EVERY EVENING  . ocrelizumab (OCREVUS) 300 MG/10ML injection Inject 600 mg into the vein every 6 (six) months.  . rizatriptan (MAXALT-MLT) 10 MG disintegrating tablet Take 1 tablet (10 mg total) by mouth as needed for migraine. May repeat in 2 hours if needed  . Vitamin D, Ergocalciferol, (DRISDOL) 1.25 MG (50000 UNIT) CAPS capsule Take 1 capsule (50,000 Units total) by mouth 2 (two) times a week.     Allergies:   Amoxicillin and Milk-related compounds   Social History   Tobacco Use  . Smoking status: Never Smoker  . Smokeless tobacco: Never Used  Substance Use Topics  . Alcohol use: Yes    Alcohol/week: 0.0 standard drinks    Comment: occ  . Drug use: No     Family Hx: The patient's family history includes Asthma in her mother; Cancer in her paternal grandfather and paternal uncle; Hypertension in her father.  ROS:   Please see the history of present illness.    Review of Systems  Constitutional: Negative for chills and fever.  HENT: Positive for congestion and sore throat.   Respiratory: Positive for cough and sputum production. Negative for shortness of breath.   Cardiovascular: Negative for chest pain.  Gastrointestinal: Negative for diarrhea.  Musculoskeletal: Positive for myalgias.    All other systems reviewed and are negative.   Labs/Other Tests and Data Reviewed:    Recent Labs: 04/21/2019: BUN 8; Creatinine, Ser 0.80; Potassium 4.0; Sodium 139; TSH 1.320 08/12/2019: ALT 8; Hemoglobin 12.4; Platelets 292   Recent Lipid Panel Lab Results  Component  Value Date/Time   CHOL  10/11/2008 03:30 AM    154        ATP III CLASSIFICATION:  <200     mg/dL   Desirable  200-239  mg/dL   Borderline High  >=240    mg/dL   High          TRIG 94 10/11/2008 03:30 AM   HDL 55 10/11/2008 03:30 AM   CHOLHDL 2.8 10/11/2008 03:30 AM   LDLCALC  10/11/2008 03:30 AM    80        Total Cholesterol/HDL:CHD Risk Coronary Heart Disease Risk Table                     Men   Women  1/2 Average Risk   3.4   3.3  Average Risk       5.0   4.4  2 X Average Risk   9.6   7.1  3 X Average Risk  23.4   11.0  Use the calculated Patient Ratio above and the CHD Risk Table to determine the patient's CHD Risk.        ATP III CLASSIFICATION (LDL):  <100     mg/dL   Optimal  100-129  mg/dL   Near or Above                    Optimal  130-159  mg/dL   Borderline  160-189  mg/dL   High  >190     mg/dL   Very High    Wt Readings from Last 3 Encounters:  08/12/19 159 lb 8 oz (72.3 kg)  03/04/19 151 lb 9.6 oz (68.8 kg)  02/19/19 150 lb 12.8 oz (68.4 kg)     Exam:    Vital Signs:  LMP 12/11/2015 (Approximate)     Physical Exam Vitals and nursing note reviewed.  HENT:     Head: Normocephalic and atraumatic.  Pulmonary:     Effort: Pulmonary effort is normal.  Neurological:     Mental Status: She is alert and oriented to person, place, and time.  Psychiatric:        Mood and Affect: Affect normal.     ASSESSMENT & PLAN:     1) COVID 19  -Patient tested positive on Tuesday  -Symptoms of body aches, cough, sore throat, headache, no fever. -Comorbidities include MS; she is also followed by a neurologist -Referral to monoclonal antibodies  -azithromycin 250 mg (z-Pak) sent to pharmacy.  -Advised patient to take Vitamin C, D, Zinc. Take baby aspirin daily. Keep yourself hydrated with a lot of water and rest. Take Delsym for cough and Mucinex. Take Tylenol or pain reliever every 4-6 hours as needed for pain/fever/body ache. Educated patient if  symptoms get worse or if she experiences any SOB or pain in her legs to seek immediate emergency care. Continue to monitor your pulse oxygen. Call us if you have any questions. Quarantine for 5 days if tested positive and no symptoms or 10 days if tested positive and have symptoms. Wear a mask around other people.    2) Lab test positive for detection of COVID 19 virus   -Symptoms of body aches, cough, sore throat, headache, no fever. -Comorbidities include MS; she is also followed by a neurologist -Referral to monoclonal antibodies  -azithromycin 250 mg (z-Pak) sent to pharmacy.  -Advised patient to take Vitamin C, D, Zinc. Take baby aspirin daily. Keep yourself hydrated with a lot of water and rest. Take Delsym for cough and Mucinex. Take Tylenol or pain reliever every 4-6 hours as needed for pain/fever/body ache. Educated patient if symptoms get worse or if she experiences any SOB or pain in her legs to seek immediate emergency care. Continue to monitor your pulse oxygen. Call us if you have any questions. Quarantine for 5 days if tested positive and no symptoms or 10 days if tested positive and have symptoms. Wear a mask around other people.    COVID-19 Education: The signs and symptoms of COVID-19 were discussed with the patient and how to seek care for testing (follow up with PCP or arrange E-visit).  The importance of social distancing was discussed today.  Patient Risk:   After full review of this patients clinical status, I feel that they are at least moderate risk at this time.  Time:   Today, I have spent 20 minutes with the patient with telehealth technology discussing above diagnoses.     Medication Adjustments/Labs and Tests Ordered:  Current medicines are reviewed at length with the patient today.  Concerns regarding medicines are outlined above.   Tests Ordered: No orders of the defined types were placed in this encounter.   Medication Changes: Meds ordered this encounter   Medications  . azithromycin (ZITHROMAX Z-PAK) 250 MG tablet    Sig: Take 2 tablets (500 mg) on  Day 1,  followed by 1 tablet (250 mg) once daily on Days 2 through 5.    Dispense:  6 each    Refill:  0    Disposition:  Follow up if symptoms get worse   Signed, Bary Castilla, NP

## 2020-02-13 ENCOUNTER — Ambulatory Visit (HOSPITAL_COMMUNITY)
Admission: RE | Admit: 2020-02-13 | Discharge: 2020-02-13 | Disposition: A | Discharge status: Home / Self Care | Payer: BC Managed Care – PPO | Source: Ambulatory Visit | Attending: Pulmonary Disease | Admitting: Pulmonary Disease

## 2020-02-13 ENCOUNTER — Telehealth: Payer: Self-pay | Admitting: Nurse Practitioner

## 2020-02-13 ENCOUNTER — Other Ambulatory Visit: Payer: Self-pay | Admitting: Nurse Practitioner

## 2020-02-13 ENCOUNTER — Telehealth (HOSPITAL_COMMUNITY): Payer: Self-pay | Admitting: Pharmacist

## 2020-02-13 DIAGNOSIS — U071 COVID-19: Secondary | ICD-10-CM

## 2020-02-13 LAB — COMPREHENSIVE METABOLIC PANEL
ALT: 15 U/L (ref 0–44)
AST: 22 U/L (ref 15–41)
Albumin: 4.6 g/dL (ref 3.5–5.0)
Alkaline Phosphatase: 52 U/L (ref 38–126)
Anion gap: 11 (ref 5–15)
BUN: 8 mg/dL (ref 6–20)
CO2: 23 mmol/L (ref 22–32)
Calcium: 9.4 mg/dL (ref 8.9–10.3)
Chloride: 105 mmol/L (ref 98–111)
Creatinine, Ser: 0.88 mg/dL (ref 0.44–1.00)
GFR, Estimated: >60 mL/min (ref >60)
Glucose, Bld: 96 mg/dL (ref 70–99)
Potassium: 3.7 mmol/L (ref 3.5–5.1)
Sodium: 139 mmol/L (ref 135–145)
Total Bilirubin: 1.2 mg/dL (ref 0.3–1.2)
Total Protein: 7.7 g/dL (ref 6.5–8.1)

## 2020-02-13 MED ORDER — NIRMATRELVIR/RITONAVIR (PAXLOVID)TABLET: 3.0000 | ORAL_TABLET | Freq: Two times a day (BID) | ORAL | 0 refills | Status: AC

## 2020-02-13 MED FILL — PAXLOVID 20 X 150 MG & 10 X: 20 X 150 MG | 5 days supply | Qty: 30 | Fill #0

## 2020-02-13 NOTE — Progress Notes (Signed)
Cr Clearance 91  Full dose prescription sent to [facility] pharmacy.

## 2020-02-13 NOTE — Telephone Encounter (Signed)
Patient was prescribed oral covid treatment paxlovid and treatment note was reviewed. Medication has been received by [place] and reviewed for appropriateness.  Drug Interactions or Dosage Adjustments Noted:  CrCl: >60  DDI: no concerns, other than mild interaction with levocetirizine; patient educated that paxlovid could increase levels of this allergy medication   Delivery Method: Pick-up   Patient contacted for counseling on 02/13/2020 and verbalized understanding.   Delivery or Pick-Up Date: 02/13/2020   Kelly Alvarado 02/13/2020, 1:18 PM [facility] Out[facility] Pharmacist Phone# [phone]

## 2020-02-13 NOTE — Telephone Encounter (Signed)
Called to discuss with patient about COVID-19 symptoms and the use of one of the available treatments for those with mild to moderate Covid symptoms and at a high risk of hospitalization.  Pt appears to qualify for outpatient treatment due to co-morbid conditions and/or a member of an at-risk group in accordance with the FDA Emergency Use Authorization.    Symptom onset: 1/4 Vaccinated: yes  Booster? yes Qualifiers: multiple sclerosis  Outpatient Oral COVID Treatment Note  I connected with Kelly Alvarado on 02/13/2020/11:45 AM by telephone and verified that I am speaking with the correct person using two identifiers.  I discussed the limitations, risks, security, and privacy concerns of performing an evaluation and management service by telephone and the availability of in person appointments. I also discussed with the patient that there may be a patient responsible charge related to this service. The patient expressed understanding and agreed to proceed.  Patient location: home Provider location: office  Diagnosis: COVID-19 infection  Purpose of visit: Discussion of potential use of Molnupiravir or Paxlovid, a new treatment for mild to moderate COVID-19 viral infection in non-hospitalized patients.   Subjective: Patient is a 45 y.o. female who has been diagnosed with COVID 19 viral infection.  Their symptoms began on 02/08/2019 with fever, body aches, cough.    Past Medical History:  Diagnosis Date  . Allergy   . Anemia    HEAVY PERIODS  . Headache(784.0)    MIGRAINES  . Multiple sclerosis ([place])   . Neuromuscular disorder ([place])    MS  . Pulmonary embolism ([place]) 2013   lt-?bcp  . Wears glasses     Allergies  Allergen Reactions  . Amoxicillin Hives    Has patient had a PCN reaction causing immediate rash, facial/tongue/throat swelling, SOB or lightheadedness with hypotension: Yes Has patient had a PCN reaction causing severe rash involving mucus membranes or skin necrosis: Yes Has  patient had a PCN reaction that required hospitalization No Has patient had a PCN reaction occurring within the last 10 years: No If all of the above answers are "NO", then may proceed with Cephalosporin use.   . Milk-Related Compounds Diarrhea and Nausea And Vomiting    Stomach cramps    @PTAMEDS @ Outpatient Encounter Medications as of 02/13/2020  Medication Sig  . amphetamine-dextroamphetamine (ADDERALL XR) 25 MG 24 hr capsule Take 1 capsule by mouth daily.  Marland Kitchen azithromycin (ZITHROMAX Z-PAK) 250 MG tablet Take 2 tablets (500 mg) on  Day 1,  followed by 1 tablet (250 mg) once daily on Days 2 through 5.  . gabapentin (NEURONTIN) 300 MG capsule TAKE 1 CAPSULE IN AM, AND 1 CAP IN EVENING, AND 2 AT BEDTIME  . Galcanezumab-gnlm (EMGALITY) 120 MG/ML SOAJ Inject 1 pen into the skin every 28 (twenty-eight) days.  Marland Kitchen levocetirizine (XYZAL) 5 MG tablet TAKE 1 TABLET BY MOUTH EVERY DAY EVERY EVENING  . ocrelizumab (OCREVUS) 300 MG/10ML injection Inject 600 mg into the vein every 6 (six) months.  . rizatriptan (MAXALT-MLT) 10 MG disintegrating tablet Take 1 tablet (10 mg total) by mouth as needed for migraine. May repeat in 2 hours if needed  . Vitamin D, Ergocalciferol, (DRISDOL) 1.25 MG (50000 UNIT) CAPS capsule Take 1 capsule (50,000 Units total) by mouth 2 (two) times a week.  . BD PEN NEEDLE NANO U/F 32G X 4 MM MISC USE AS DIRECTED  . indomethacin (INDOCIN) 25 MG capsule Take 1 capsule (25 mg total) by mouth 3 (three) times daily as needed. (Patient not taking:  Reported on 02/13/2020)   No facility-administered encounter medications on file as of 02/13/2020.    Objective: Patient appears/sounds mildly ill. Mild intermittent, dry, cough  They are in no apparent distress.  Breathing is non labored.  Mood and behavior are normal.  Laboratory Data:  No results found for this or any previous visit (from the past 2160 hour(s)).   Assessment: 45 y.o. female with mild/moderate COVID 19 viral infection  diagnosed on 1/at high risk for progression to severe COVID 19.  Plan:  This patient is a 45 y.o. female that meets the following criteria for Emergency Use Authorization of: Paxlovid 1. Age >12 yr AND > 40 kg 2. SARS-COV-2 positive test 3. Symptom onset < 5 days 4. Mild-to-moderate COVID disease with high risk for severe progression to hospitalization or death  I have spoken and communicated the following to the patient or parent/caregiver regarding: 1. Paxlovid is an unapproved drug that is authorized for use under an Emergency Use Authorization.  2. There are no adequate, approved, available products for the treatment of COVID-19 in adults who have mild-to-moderate COVID-19 and are at high risk for progressing to severe COVID-19, including hospitalization or death. 3. Other therapeutics are currently authorized. For additional information on all products authorized for treatment or prevention of COVID-19, please see [email or web address].  4. There are benefits and risks of taking this treatment as outlined in the "Fact Sheet for Patients and Caregivers."  5. "Fact Sheet for Patients and Caregivers" was reviewed with patient. A hard copy will be provided to patient from pharmacy prior to the patient receiving treatment. 6. Patients should continue to self-isolate and use infection control measures (e.g., wear mask, isolate, social distance, avoid sharing personal items, clean and disinfect "high touch" surfaces, and frequent handwashing) according to CDC guidelines.  7. The patient or parent/caregiver has the option to accept or refuse treatment. 8. Patient medication history was reviewed for potential drug interactions:No drug interactions 9. Patient's creatinine clearance was calculated to be will go to clinic for draw, and will be prescribed based on calculation.   After reviewing above  information with the patient, the patient agrees to receive Paxlovid.  Follow up instructions:    . Take prescription BID x 5 days as directed . Reach out to pharmacist for counseling on medication if desired . For concerns regarding further COVID symptoms please follow up with your PCP or urgent care . For urgent or life-threatening issues, seek care at your local emergency department  The patient was provided an opportunity to ask questions, and all were answered. The patient agreed with the plan and demonstrated an understanding of the instructions.   Script will be sent to [facility] and opted to pick up RX.  The patient was advised to call their PCP or seek an in-person evaluation if the symptoms worsen or if the condition fails to improve as anticipated.   I provided 69 minutes of non face-to-face telephone visit time during this encounter, and > 50% was spent counseling as documented under my assessment & plan.  Payton Emerald, NP 02/13/2020 /11:45 AM   Leonard Downing [facility]

## 2020-02-13 NOTE — Progress Notes (Signed)
For Paxlovid prescription

## 2020-02-15 NOTE — Progress Notes (Signed)
Last visit note was addended to reflect location of patient and provider.

## 2020-03-05 ENCOUNTER — Other Ambulatory Visit: Payer: Self-pay | Admitting: Neurology

## 2020-03-07 ENCOUNTER — Telehealth: Payer: Self-pay | Admitting: *Deleted

## 2020-03-07 NOTE — Telephone Encounter (Signed)
Submitted PA emgality on CMM. Key: [id number]. PA Case ID: [id number] - Rx #: [id number]. Waiting on determination from [place].

## 2020-03-08 ENCOUNTER — Other Ambulatory Visit: Payer: Self-pay | Admitting: Neurology

## 2020-03-08 NOTE — Telephone Encounter (Signed)
PA approved 03/07/20-03/07/20. PA# [place] [id number] Non-grandfathered [id number]

## 2020-03-29 NOTE — Telephone Encounter (Signed)
error 

## 2020-04-02 ENCOUNTER — Other Ambulatory Visit: Payer: Self-pay | Admitting: Nurse Practitioner

## 2020-04-04 ENCOUNTER — Other Ambulatory Visit: Payer: Self-pay | Admitting: *Deleted

## 2020-04-04 MED ORDER — AMPHETAMINE-DEXTROAMPHET ER 25 MG PO CP24: 25.0000 mg | ORAL_CAPSULE | Freq: Every day | ORAL | 0 refills | Status: DC

## 2020-04-21 ENCOUNTER — Other Ambulatory Visit: Payer: Self-pay

## 2020-04-21 ENCOUNTER — Ambulatory Visit: Payer: BC Managed Care – PPO | Admitting: Nurse Practitioner

## 2020-04-21 VITALS — BP 120/68 | HR 68 | Temp 98.3°F | Ht 63.6 in | Wt 153.2 lb

## 2020-04-21 DIAGNOSIS — E663 Overweight: Secondary | ICD-10-CM

## 2020-04-21 DIAGNOSIS — M79605 Pain in left leg: Secondary | ICD-10-CM | POA: Diagnosis not present

## 2020-04-21 DIAGNOSIS — G35 Multiple sclerosis: Secondary | ICD-10-CM | POA: Diagnosis not present

## 2020-04-21 DIAGNOSIS — R5383 Other fatigue: Secondary | ICD-10-CM

## 2020-04-21 DIAGNOSIS — E559 Vitamin D deficiency, unspecified: Secondary | ICD-10-CM | POA: Diagnosis not present

## 2020-04-21 DIAGNOSIS — Z0000 Encounter for general adult medical examination without abnormal findings: Secondary | ICD-10-CM

## 2020-04-21 DIAGNOSIS — Z13220 Encounter for screening for lipoid disorders: Secondary | ICD-10-CM

## 2020-04-21 DIAGNOSIS — G35D Multiple sclerosis, unspecified: Secondary | ICD-10-CM

## 2020-04-21 NOTE — Progress Notes (Signed)
I,Tianna Badgett,acting as a Education administrator for [employer], NP.,have documented all relevant documentation on the behalf of [employer], NP,as directed by  Bary Castilla, NP while in the presence of Bary Castilla, NP.  This visit occurred during the SARS-CoV-2 public health emergency.  Safety protocols were in place, including screening questions prior to the visit, additional usage of staff PPE, and extensive cleaning of exam room while observing appropriate contact time as indicated for disinfecting solutions.  Subjective:     Patient ID: Kelly Alvarado , female    DOB: March 23, 1975 , 45 y.o.   MRN: [id number]   Chief Complaint  Patient presents with  . Annual Exam    HPI  Patient is here for physical exam. She is followed by Dr Mancel Bale at [facility] for her GYN care. She sees a neurologist every 6 months for her MS. She eats a healthy diet and walk at work all day long.  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 Her only concern today is that she has been having some upper left leg pain. She has a history of blood clot so she is worried about it.   Wt Readings from Last 3 Encounters: 04/21/20 : 153 lb 3.2 oz (69.5 kg) 08/12/19 : 159 lb 8 oz (72.3 kg) 03/04/19 : 151 lb 9.6 oz (68.8 kg)   Leg Pain  The incident occurred more than 1 week ago. There was no injury mechanism. The pain is present in the left leg and left thigh. The quality of the pain is described as aching. The pain is mild. Pertinent negatives include no numbness or tingling. The symptoms are aggravated by movement.     Past Medical History:  Diagnosis Date  . Allergy   . Anemia    HEAVY PERIODS  . Headache(784.0)    MIGRAINES  . Multiple sclerosis ([place])   . Neuromuscular disorder ([place])    MS  . Pulmonary embolism ([place]) 2013   lt-?bcp  . Wears glasses      Family History  Problem Relation Age of Onset  . Asthma Mother   . Hypertension Father   . Cancer Paternal Uncle   .  Cancer Paternal Grandfather      Current Outpatient Medications:  .  amphetamine-dextroamphetamine (ADDERALL XR) 25 MG 24 hr capsule, Take 1 capsule by mouth daily., Disp: 30 capsule, Rfl: 0 .  EMGALITY 120 MG/ML SOAJ, INJECT 1 PEN INTO THE SKIN EVERY 28 DAYS., Disp: 3 mL, Rfl: 4 .  gabapentin (NEURONTIN) 300 MG capsule, TAKE 1 CAPSULE IN AM, AND 1 CAP IN EVENING, AND 2 AT BEDTIME, Disp: 360 capsule, Rfl: 1 .  levocetirizine (XYZAL) 5 MG tablet, TAKE 1 TABLET BY MOUTH EVERY DAY EVERY EVENING, Disp: 90 tablet, Rfl: 1 .  ocrelizumab (OCREVUS) 300 MG/10ML injection, Inject 600 mg into the vein every 6 (six) months., Disp: , Rfl:  .  rizatriptan (MAXALT-MLT) 10 MG disintegrating tablet, Take 1 tablet (10 mg total) by mouth as needed for migraine. May repeat in 2 hours if needed, Disp: 27 tablet, Rfl: 3 .  Vitamin D, Ergocalciferol, (DRISDOL) 1.25 MG (50000 UNIT) CAPS capsule, TAKE 1 CAPSULE (50,000 UNITS TOTAL) BY MOUTH 2 (TWO) TIMES A WEEK., Disp: 26 capsule, Rfl: 1 .  BD PEN NEEDLE NANO U/F 32G X 4 MM MISC, USE AS DIRECTED, Disp: 30 each, Rfl: 5   Allergies  Allergen Reactions  . Amoxicillin Hives    Has patient had a PCN  reaction causing immediate rash, facial/tongue/throat swelling, SOB or lightheadedness with hypotension: Yes Has patient had a PCN reaction causing severe rash involving mucus membranes or skin necrosis: Yes Has patient had a PCN reaction that required hospitalization No Has patient had a PCN reaction occurring within the last 10 years: No If all of the above answers are "NO", then may proceed with Cephalosporin use.   . Milk-Related Compounds Diarrhea and Nausea And Vomiting    Stomach cramps      Last LMP was Patient's last menstrual period was 12/11/2015 (approximate)..  Negative for: breast discharge, breast lump(s), breast pain and breast self exam. Associated symptoms include abnormal vaginal bleeding. Pertinent negatives include abnormal bleeding (hematology),  anxiety, decreased libido, depression, difficulty falling sleep, dyspareunia, history of infertility, nocturia, sexual dysfunction, sleep disturbances, urinary incontinence, urinary urgency, vaginal discharge and vaginal itching. Diet regular.The patient states her exercise level is    . The patient's tobacco use is:  Social History   Tobacco Use  Smoking Status Never Smoker  Smokeless Tobacco Never Used  . She has been exposed to passive smoke. The patient's alcohol use is:  Social History   Substance and Sexual Activity  Alcohol Use Yes  . Alcohol/week: 0.0 standard drinks   Comment: occ    Review of Systems  Constitutional: Positive for fatigue. Negative for chills.  HENT: Negative.  Negative for congestion, ear discharge, ear pain, postnasal drip, sinus pressure, sinus pain and sneezing.   Eyes: Negative.  Negative for pain.  Respiratory: Negative for cough, chest tightness, shortness of breath and wheezing.   Cardiovascular: Negative.  Negative for chest pain and palpitations.  Gastrointestinal: Negative.  Negative for constipation and diarrhea.  Endocrine: Negative.  Negative for polydipsia and polyphagia.  Genitourinary: Negative for pelvic pain and vaginal pain.  Musculoskeletal: Positive for myalgias. Negative for back pain and gait problem.       Left thigh and leg pain   Skin: Negative.  Negative for rash.  Allergic/Immunologic: Negative.   Neurological: Negative.  Negative for tingling and numbness.  Hematological: Negative.   Psychiatric/Behavioral: Negative.  Negative for sleep disturbance.  All other systems reviewed and are negative.    Today's Vitals   04/21/20 0838  BP: 120/68  Pulse: 68  Temp: 98.3 F (36.8 C)  TempSrc: Oral  Weight: 153 lb 3.2 oz (69.5 kg)  Height: 5' 3.6" (1.615 m)   Body mass index is 26.63 kg/m.  Wt Readings from Last 3 Encounters:  04/21/20 153 lb 3.2 oz (69.5 kg)  08/12/19 159 lb 8 oz (72.3 kg)  03/04/19 151 lb 9.6 oz (68.8  kg)    Objective:  Physical Exam Vitals and nursing note reviewed.  Constitutional:      Appearance: Normal appearance.  HENT:     Head: Normocephalic and atraumatic.     Right Ear: Tympanic membrane, ear canal and external ear normal. There is no impacted cerumen.     Left Ear: Tympanic membrane, ear canal and external ear normal. There is no impacted cerumen.     Nose:     Comments: Deferred. Masked     Mouth/Throat:     Comments: Deferred. Masked  Eyes:     Extraocular Movements: Extraocular movements intact.     Conjunctiva/sclera: Conjunctivae normal.     Pupils: Pupils are equal, round, and reactive to light.  Cardiovascular:     Rate and Rhythm: Normal rate and regular rhythm.     Pulses: Normal pulses.  Heart sounds: Normal heart sounds.  Pulmonary:     Effort: Pulmonary effort is normal. No respiratory distress.     Breath sounds: Normal breath sounds. No wheezing or rales.  Abdominal:     General: Bowel sounds are normal.     Palpations: Abdomen is soft.  Genitourinary:    Comments: Deferred. Patient sees a OBGYN  Musculoskeletal:        General: Normal range of motion.     Cervical back: Normal range of motion and neck supple.     Left upper leg: No tenderness.     Right lower leg: Normal.     Left lower leg: Swelling present. No tenderness. No edema.     Comments: No warmth to left leg. Slight swelling comparing to right leg.   Skin:    General: Skin is warm and dry.  Neurological:     General: No focal deficit present.     Mental Status: She is alert and oriented to person, place, and time.  Psychiatric:        Mood and Affect: Mood normal.        Behavior: Behavior normal.         Assessment And Plan:     1. Encounter for annual physical exam -Educated patient about the importance of eating a healthy diet, exercising, immunization, screenings and multivitamins.  - CBC - Hemoglobin A1c - CMP14+EGFR  2. Pain of left lower extremity -Due to  her history of blood clot in the past will order [place].  - VAS [place] LOWER EXTREMITY VENOUS (DVT); Future  3. Multiple sclerosis ([place]) -Patients sees Dr. Felecia Shelling. He is overseeing her medications as well as her gabapentin and adderall. She is also taking emgality for migraines.    4. Vitamin D deficiency - Vitamin D (25 hydroxy)  5. Encounter for screening for lipid disorder -Will check her lipid panel  - Lipid panel  6. Fatigue, unspecified type Will check her thyroid levels and it could also be her MS - TSH + free T4  7. Overweight (BMI 25.0-29.9) She is encouraged to strive for BMI less than 26 to decrease cardiac risk. Advised to aim for at least 150 minutes of exercise per week.  Follow up in 1 year for physical exam.    Staying healthy and adopting a healthy lifestyle for your overall health is important. You should eat 7 or more servings of fruits and vegetables per day. You should drink plenty of water to keep yourself hydrated and your kidneys healthy. This includes about 65-80+ fluid ounces of water. Limit your intake of animal fats especially for elevated cholesterol. Avoid highly processed food and limit your salt intake if you have hypertension. Avoid foods high in saturated/Trans fats. Along with a healthy diet it is also very important to maintain time for yourself to maintain a healthy mental health with low stress levels. You should get atleast 150 min of moderate intensity exercise weekly for a healthy heart. Along with eating right and exercising, aim for at least 7-9 hours of sleep daily.  Eat more whole grains which includes barley, wheat berries, oats, brown rice and whole wheat pasta. Use healthy plant oils which include olive, soy, corn, sunflower and peanut. Limit your caffeine and sugary drinks. Limit your intake of fast foods. Limit milk and dairy products to one or two daily servings.    Patient was given opportunity to ask questions. Patient verbalized understanding of  the plan and was able to repeat  key elements of the plan. All questions were answered to their satisfaction.   Bary Castilla, NP   I, Bary Castilla, NP, have reviewed all documentation for this visit. The documentation on 04/21/20 for the exam, diagnosis, procedures, and orders are all accurate and complete.  THE PATIENT IS ENCOURAGED TO PRACTICE SOCIAL DISTANCING DUE TO THE COVID-19 PANDEMIC.

## 2020-04-21 NOTE — Patient Instructions (Signed)
Health Maintenance, Female Adopting a healthy lifestyle and getting preventive care are important in promoting health and wellness. Ask your health care provider about:  The right schedule for you to have regular tests and exams.  Things you can do on your own to prevent diseases and keep yourself healthy. What should I know about diet, weight, and exercise? Eat a healthy diet  Eat a diet that includes plenty of vegetables, fruits, low-fat dairy products, and lean protein.  Do not eat a lot of foods that are high in solid fats, added sugars, or sodium.   Maintain a healthy weight Body mass index (BMI) is used to identify weight problems. It estimates body fat based on height and weight. Your health care provider can help determine your BMI and help you achieve or maintain a healthy weight. Get regular exercise Get regular exercise. This is one of the most important things you can do for your health. Most adults should:  Exercise for at least 150 minutes each week. The exercise should increase your heart rate and make you sweat (moderate-intensity exercise).  Do strengthening exercises at least twice a week. This is in addition to the moderate-intensity exercise.  Spend less time sitting. Even light physical activity can be beneficial. Watch cholesterol and blood lipids Have your blood tested for lipids and cholesterol at 47 years of age, then have this test every 5 years. Have your cholesterol levels checked more often if:  Your lipid or cholesterol levels are high.  You are older than 47 years of age.  You are at high risk for heart disease. What should I know about cancer screening? Depending on your health history and family history, you may need to have cancer screening at various ages. This may include screening for:  Breast cancer.  Cervical cancer.  Colorectal cancer.  Skin cancer.  Lung cancer. What should I know about heart disease, diabetes, and high blood  pressure? Blood pressure and heart disease  High blood pressure causes heart disease and increases the risk of stroke. This is more likely to develop in people who have high blood pressure readings, are of African descent, or are overweight.  Have your blood pressure checked: ? Every 3-5 years if you are 18-39 years of age. ? Every year if you are 40 years old or older. Diabetes Have regular diabetes screenings. This checks your fasting blood sugar level. Have the screening done:  Once every three years after age 40 if you are at a normal weight and have a low risk for diabetes.  More often and at a younger age if you are overweight or have a high risk for diabetes. What should I know about preventing infection? Hepatitis B If you have a higher risk for hepatitis B, you should be screened for this virus. Talk with your health care provider to find out if you are at risk for hepatitis B infection. Hepatitis C Testing is recommended for:  Everyone born from 1945 through 1965.  Anyone with known risk factors for hepatitis C. Sexually transmitted infections (STIs)  Get screened for STIs, including gonorrhea and chlamydia, if: ? You are sexually active and are younger than 47 years of age. ? You are older than 47 years of age and your health care provider tells you that you are at risk for this type of infection. ? Your sexual activity has changed since you were last screened, and you are at increased risk for chlamydia or gonorrhea. Ask your health care provider   if you are at risk.  Ask your health care provider about whether you are at high risk for HIV. Your health care provider may recommend a prescription medicine to help prevent HIV infection. If you choose to take medicine to prevent HIV, you should first get tested for HIV. You should then be tested every 3 months for as long as you are taking the medicine. Pregnancy  If you are about to stop having your period (premenopausal) and  you may become pregnant, seek counseling before you get pregnant.  Take 400 to 800 micrograms (mcg) of folic acid every day if you become pregnant.  Ask for birth control (contraception) if you want to prevent pregnancy. Osteoporosis and menopause Osteoporosis is a disease in which the bones lose minerals and strength with aging. This can result in bone fractures. If you are 65 years old or older, or if you are at risk for osteoporosis and fractures, ask your health care provider if you should:  Be screened for bone loss.  Take a calcium or vitamin D supplement to lower your risk of fractures.  Be given hormone replacement therapy (HRT) to treat symptoms of menopause. Follow these instructions at home: Lifestyle  Do not use any products that contain nicotine or tobacco, such as cigarettes, e-cigarettes, and chewing tobacco. If you need help quitting, ask your health care provider.  Do not use street drugs.  Do not share needles.  Ask your health care provider for help if you need support or information about quitting drugs. Alcohol use  Do not drink alcohol if: ? Your health care provider tells you not to drink. ? You are pregnant, may be pregnant, or are planning to become pregnant.  If you drink alcohol: ? Limit how much you use to 0-1 drink a day. ? Limit intake if you are breastfeeding.  Be aware of how much alcohol is in your drink. In the [place] one drink equals one 12 oz bottle of beer (355 mL), one 5 oz glass of wine (148 mL), or one 1 oz glass of hard liquor (44 mL). General instructions  Schedule regular health, dental, and eye exams.  Stay current with your vaccines.  Tell your health care provider if: ? You often feel depressed. ? You have ever been abused or do not feel safe at home. Summary  Adopting a healthy lifestyle and getting preventive care are important in promoting health and wellness.  Follow your health care provider's instructions about healthy  diet, exercising, and getting tested or screened for diseases.  Follow your health care provider's instructions on monitoring your cholesterol and blood pressure. This information is not intended to replace advice given to you by your health care provider. Make sure you discuss any questions you have with your health care provider. Document Revised: 01/15/2018 Document Reviewed: 01/15/2018 Elsevier Patient Education  2021 [employer] Inc.  

## 2020-04-22 ENCOUNTER — Ambulatory Visit (HOSPITAL_COMMUNITY)
Admission: RE | Admit: 2020-04-22 | Discharge: 2020-04-22 | Disposition: A | Discharge status: Home / Self Care | Payer: BC Managed Care – PPO | Source: Ambulatory Visit | Attending: Internal Medicine | Admitting: Internal Medicine

## 2020-04-22 DIAGNOSIS — M79605 Pain in left leg: Secondary | ICD-10-CM | POA: Insufficient documentation

## 2020-04-22 LAB — HEMOGLOBIN A1C
Est. average glucose Bld gHb Est-mCnc: 82 mg/dL
Hgb A1c MFr Bld: 4.5 % — ABNORMAL LOW (ref 4.8–5.6)

## 2020-04-22 LAB — CMP14+EGFR
ALT: 7 IU/L (ref 0–32)
AST: 17 IU/L (ref 0–40)
Albumin/Globulin Ratio: 2.1 (ref 1.2–2.2)
Albumin: 4.6 g/dL (ref 3.8–4.8)
Alkaline Phosphatase: 68 IU/L (ref 44–121)
BUN/Creatinine Ratio: 8 — ABNORMAL LOW (ref 9–23)
BUN: 7 mg/dL (ref 6–24)
Bilirubin Total: 0.6 mg/dL (ref 0.0–1.2)
CO2: 23 mmol/L (ref 20–29)
Calcium: 9.5 mg/dL (ref 8.7–10.2)
Chloride: 101 mmol/L (ref 96–106)
Creatinine, Ser: 0.85 mg/dL (ref 0.57–1.00)
Globulin, Total: 2.2 g/dL (ref 1.5–4.5)
Glucose: 71 mg/dL (ref 65–99)
Potassium: 4 mmol/L (ref 3.5–5.2)
Sodium: 139 mmol/L (ref 134–144)
Total Protein: 6.8 g/dL (ref 6.0–8.5)
eGFR: 86 mL/min/{1.73_m2} (ref >59)

## 2020-04-22 LAB — LIPID PANEL
Chol/HDL Ratio: 2.5 ratio (ref 0.0–4.4)
Cholesterol, Total: 202 mg/dL — ABNORMAL HIGH (ref 100–199)
HDL: 82 mg/dL (ref >39)
LDL Chol Calc (NIH): 106 mg/dL — ABNORMAL HIGH (ref 0–99)
Triglycerides: 79 mg/dL (ref 0–149)
VLDL Cholesterol Cal: 14 mg/dL (ref 5–40)

## 2020-04-22 LAB — CBC
Hematocrit: 36.7 % (ref 34.0–46.6)
Hemoglobin: 12.4 g/dL (ref 11.1–15.9)
MCH: 31.3 pg (ref 26.6–33.0)
MCHC: 33.8 g/dL (ref 31.5–35.7)
MCV: 93 fL (ref 79–97)
Platelets: 285 10*3/uL (ref 150–450)
RBC: 3.96 x10E6/uL (ref 3.77–5.28)
RDW: 12.3 % (ref 11.7–15.4)
WBC: 3.9 10*3/uL (ref 3.4–10.8)

## 2020-04-22 LAB — VITAMIN D 25 HYDROXY (VIT D DEFICIENCY, FRACTURES): Vit D, 25-Hydroxy: 39.6 ng/mL (ref 30.0–100.0)

## 2020-04-22 LAB — TSH+FREE T4
Free T4: 1.05 ng/dL (ref 0.82–1.77)
TSH: 1.73 u[IU]/mL (ref 0.450–4.500)

## 2020-05-02 ENCOUNTER — Telehealth: Payer: Self-pay | Admitting: Neurology

## 2020-05-02 MED ORDER — NITROFURANTOIN MACROCRYSTAL 100 MG PO CAPS: 100.0000 mg | ORAL_CAPSULE | Freq: Two times a day (BID) | ORAL | 0 refills | Status: DC

## 2020-05-02 MED ORDER — AMPHETAMINE-DEXTROAMPHET ER 25 MG PO CP24: 25.0000 mg | ORAL_CAPSULE | Freq: Every day | ORAL | 0 refills | Status: DC

## 2020-05-02 NOTE — Telephone Encounter (Signed)
Called pt. She would like appt week of 05/16/20 d/t being on spring break. Scheduled appt for 05/16/20 at 1:30pm with Dr. Felecia Shelling.  She also asked for refill on Adderall and nitrofurantoin (feels like she has UTI). Per Dr. Felecia Shelling, ok to refill. I e-scribed to [place] per pt request.

## 2020-05-02 NOTE — Telephone Encounter (Signed)
Pt called, need sooner appt before June. Would need an infusion before June or switch to a different medication. Can you find a way to work me in. Would like a call from the nurse.

## 2020-05-16 ENCOUNTER — Ambulatory Visit: Payer: BC Managed Care – PPO | Admitting: Neurology

## 2020-05-16 ENCOUNTER — Encounter: Payer: Self-pay | Admitting: Neurology

## 2020-05-16 VITALS — BP 109/71 | HR 73 | Ht 63.6 in | Wt 154.5 lb

## 2020-05-16 DIAGNOSIS — R5383 Other fatigue: Secondary | ICD-10-CM

## 2020-05-16 DIAGNOSIS — R569 Unspecified convulsions: Secondary | ICD-10-CM | POA: Diagnosis not present

## 2020-05-16 DIAGNOSIS — Z5181 Encounter for therapeutic drug level monitoring: Secondary | ICD-10-CM | POA: Diagnosis not present

## 2020-05-16 DIAGNOSIS — G35 Multiple sclerosis: Secondary | ICD-10-CM | POA: Diagnosis not present

## 2020-05-16 DIAGNOSIS — Z8616 Personal history of COVID-19: Secondary | ICD-10-CM

## 2020-05-16 DIAGNOSIS — G35D Multiple sclerosis, unspecified: Secondary | ICD-10-CM

## 2020-05-16 DIAGNOSIS — Z79899 Other long term (current) drug therapy: Secondary | ICD-10-CM

## 2020-05-16 DIAGNOSIS — Z7960 Long term (current) use of unspecified immunomodulators and immunosuppressants: Secondary | ICD-10-CM

## 2020-05-16 NOTE — Progress Notes (Signed)
[facility]  PATIENT: Kelly Alvarado DOB: 1975/04/07  REFERRING DOCTOR OR PCP:  [name]  SOURCE: patient and records  _________________________________   HISTORICAL  CHIEF COMPLAINT:  Chief Complaint  Patient presents with  . Follow-up    RM 13, alone. Last seen 02/11/2020 for VV. Here to discuss DMT options other than Ocrevus.    HISTORY OF PRESENT ILLNESS:  Kelly Alvarado is a 45 year old woman with relapsing remitting MS diagnosed in 2010 and h/o a seizure in 2017.  History of Present Illness: Update 05/16/2020: She had Covid-19 and received Paxlovid with benefit.    She had diarrhea as a s.e. but felt she was fine the next week and was back at work.  She had headache, sore throat, low grade fever.  She denies much nausea and no vomiting.      Her last Ocrevus infusion was October 2021.    Since starting Ocrevus, she has felt lightheaded.    She was initially interested in switching to another medication.    However, because her MS has been stable on it, she now feels she would prefer to stay on it.     She is walking well and can go up and down stairs without the bannister.    No falls.   No clumsiness.   She gets some numbness in fingers and toes. Gabapentin helps the tingling.    She has mild left sided weakness and mild spasticity, esp in her thigh.     She notes fatigue and reduced focus/attention.  This was much better when she was on Tysabri.   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     No LOC spells or seizures recently.  Only seizure was in 2017 a few days after hysterectomy.    Chronic migraines are doing much better on Emgality.   This has worked better than any other medicationShe has breakthrough once or twice a month and then several the last week of each cycle - compared to 25+ days a month before [employer].   She takes Tylenol for breakthrough headaches   In past tried topiramate, zonisamide,  nortriptyline and NSAIDs without much benefit.    She has no recent UTI.   She has less urinary hesitancy and incomplete emptying.    She works in [employer] and is currently active principle for the middle school.       MS History:    She was diagnosed with multiple sclerosis in 2010 after presenting with a left foot drop. MRI of the brain was consistent with multiple sclerosis.   She was  started on Rebif. The MS was well controlled but she felt poorly on that medication. She transferred to [facility] and began to see me in 2012. She was placed on Tysabri for about a year and felt much better while on it. Her MS did well on Tysabri as well.  Unfortunately, she was JCV positive and after about a year she stopped Tysabri and started Gilenya. She has done fairly well on Gilenya with no definite exacerbations.    IMAGING MRI brain 07/01/2018 shows multiple T2/flair hyperintense foci in the spinal cord, left middle cerebellar peduncle and in the periventricular, juxtacortical and deep white matter of the hemispheres.  These are consistent with chronic demyelinating plaque associated with multiple sclerosis.  None of the foci enhances or appears to be acute.  However, the 2 foci in the spinal cord  and several of the foci in the hemispheres were not present on the 2012 MRI.   There is a normal enhancement pattern and no acute findings.  MRI cervical spine 07/01/2018 shows T2 hyperintense foci within the spinal cord adjacent to C1-C2, C3-C4, C5 and T3.  These are consistent with chronic demyelinating plaque associated with multiple sclerosis.  None of the foci enhanced on the current study.  However, none of the 4 foci were noted on the MRI from 2010    Observations/Objective: She is a well-developed well-nourished woman in no acute distress.  She appears tired.  The head is normocephalic and atraumatic.  Sclera are anicteric.  Visible skin appears normal.  The neck has a good range of  motion.  .  She is alert and fully oriented with fluent speech and good attention, knowledge and memory.  Extraocular muscles are intact.  Facial strength is normal.  She appears to have normal strength in the arms.  Rapid alternating movements and finger-nose-finger are performed well.   REVIEW OF SYSTEMS: Constitutional: No fevers, chills, sweats, or change in appetite.  She notes fatigue and insomnia. Eyes: No visual changes, double vision, eye pain Ear, nose and throat: No hearing loss, ear pain, nasal congestion, sore throat Cardiovascular: No chest pain, palpitations Respiratory: No shortness of breath at rest or with exertion.   No wheezes GastrointestinaI: No nausea, vomiting, diarrhea, abdominal pain, fecal incontinence Genitourinary: No dysuria, urinary retention or frequency.  No nocturia. Musculoskeletal: No neck pain, back .  Notes myalgias at times Integumentary: No rash, pruritus, skin lesions Neurological: as above Psychiatric: No depression at this time.  No anxiety Endocrine: No palpitations, diaphoresis, change in appetite, change in weigh or increased thirst Hematologic/Lymphatic: No anemia, purpura, petechiae. Allergic/Immunologic: No itchy/runny eyes, nasal congestion, recent allergic reactions, rashes.  She has some food allergies.  ALLERGIES: Allergies  Allergen Reactions  . Amoxicillin Hives    Has patient had a PCN reaction causing immediate rash, facial/tongue/throat swelling, SOB or lightheadedness with hypotension: Yes Has patient had a PCN reaction causing severe rash involving mucus membranes or skin necrosis: Yes Has patient had a PCN reaction that required hospitalization No Has patient had a PCN reaction occurring within the last 10 years: No If all of the above answers are "NO", then may proceed with Cephalosporin use.   . Milk-Related Compounds Diarrhea and Nausea And Vomiting    Stomach cramps    HOME MEDICATIONS:  Current Outpatient  Medications:  .  amphetamine-dextroamphetamine (ADDERALL XR) 25 MG 24 hr capsule, Take 1 capsule by mouth daily., Disp: 30 capsule, Rfl: 0 .  EMGALITY 120 MG/ML SOAJ, INJECT 1 PEN INTO THE SKIN EVERY 28 DAYS., Disp: 3 mL, Rfl: 4 .  gabapentin (NEURONTIN) 300 MG capsule, TAKE 1 CAPSULE IN AM, AND 1 CAP IN EVENING, AND 2 AT BEDTIME, Disp: 360 capsule, Rfl: 1 .  levocetirizine (XYZAL) 5 MG tablet, TAKE 1 TABLET BY MOUTH EVERY DAY EVERY EVENING, Disp: 90 tablet, Rfl: 1 .  Nirmatrelvir & Ritonavir 20 x 150 MG & 10 x 100MG  TBPK, TAKE 3 TABLETS BY MOUTH 2 (TWO) TIMES DAILY FOR 5 DAYS., Disp: 30 each, Rfl: 0 .  ocrelizumab (OCREVUS) 300 MG/10ML injection, Inject 600 mg into the vein every 6 (six) months., Disp: , Rfl:  .  rizatriptan (MAXALT-MLT) 10 MG disintegrating tablet, Take 1 tablet (10 mg total) by mouth as needed for migraine. May repeat in 2 hours if needed, Disp: 27 tablet, Rfl: 3 .  Vitamin  D, Ergocalciferol, (DRISDOL) 1.25 MG (50000 UNIT) CAPS capsule, TAKE 1 CAPSULE (50,000 UNITS TOTAL) BY MOUTH 2 (TWO) TIMES A WEEK., Disp: 26 capsule, Rfl: 1 .  nitrofurantoin (MACRODANTIN) 100 MG capsule, Take 1 capsule (100 mg total) by mouth 2 (two) times daily. (Patient not taking: Reported on 05/16/2020), Disp: 14 capsule, Rfl: 0  PAST MEDICAL HISTORY: Past Medical History:  Diagnosis Date  . Allergy   . Anemia    HEAVY PERIODS  . Headache(784.0)    MIGRAINES  . Multiple sclerosis ([place])   . Neuromuscular disorder ([place])    MS  . Pulmonary embolism ([place]) 2013   lt-?bcp  . Wears glasses     PAST SURGICAL HISTORY: Past Surgical History:  Procedure Laterality Date  . BILATERAL SALPINGECTOMY Bilateral 12/27/2015   Procedure: BILATERAL SALPINGECTOMY;  Surgeon: Everett Graff, MD;  Location: [facility];  Service: Gynecology;  Laterality: Bilateral;  Excision of paratubal cyst  . BREAST LUMPECTOMY WITH RADIOACTIVE SEED LOCALIZATION Left 10/06/2013   Procedure:  RADIOACTIVE SEED LOCALIZATION LEFT BREAST  LUMPECTOMY;  Surgeon: Odis Hollingshead, MD;  Location: [facility];  Service: General;  Laterality: Left;  . BREAST REDUCTION SURGERY    . CYSTOSCOPY N/A 12/27/2015   Procedure: CYSTOSCOPY;  Surgeon: Everett Graff, MD;  Location: [facility];  Service: Gynecology;  Laterality: N/A;  . REPAIR VAGINAL CUFF N/A 01/09/2016   Procedure: REPAIR VAGINAL CUFF;  Surgeon: Everett Graff, MD;  Location: [facility];  Service: Gynecology;  Laterality: N/A;  . VAGINAL HYSTERECTOMY Bilateral 12/27/2015   Procedure: HYSTERECTOMY VAGINAL;  Surgeon: Everett Graff, MD;  Location: [facility];  Service: Gynecology;  Laterality: Bilateral;  . WISDOM TOOTH EXTRACTION      FAMILY HISTORY: Family History  Problem Relation Age of Onset  . Asthma Mother   . Hypertension Father   . Cancer Paternal Uncle   . Cancer Paternal Grandfather        DIAGNOSTIC DATA (LABS, IMAGING, TESTING) - I reviewed patient records, labs, notes, testing and imaging myself where available.  Lab Results  Component Value Date   WBC 3.9 04/21/2020   HGB 12.4 04/21/2020   HCT 36.7 04/21/2020   MCV 93 04/21/2020   PLT 285 04/21/2020   ______________________________________________________________________________ Physical Exam  General: The patient is well-developed and well-nourished and in no acute distress   Musculoskeletal:  Neck has good ROM and is nontender   Neurologic Exam  Mental status: The patient is alert and oriented x 3 at the time of the examination. The patient has apparent normal recent and remote memory, with an apparently normal attention span and concentration ability.   Speech is normal.  Cranial nerves: Extraocular movements are full.  Facial strength and sensation are normal.     No obvious hearing deficits are noted.  Motor:  Muscle bulk is normal.   Tone is normal. Strength is  5 / 5 in all 4 extremities.   Sensory: He has intact sensation to touch and vibration in the arms and  legs.  Coordination: Cerebellar testing reveals good finger-nose-finger and heel-to-shin bilaterally.  Gait and station: Station is normal.   Gait is normal. The tandem gait is mildly wide. Romberg is negative.   Reflexes: Deep tendon reflexes are symmetric and normal bilaterally    ASSESSMENT AND PLAN    1. Multiple sclerosis ([place])   2. Encounter for monitoring immunomodulating therapy   3. Convulsions, unspecified convulsion type ([place])   4. Other fatigue   5. Personal history of COVID-19  1.   After more thought, she has decided to stay on Ocrevus and will schedule her next infusion.  I'll check IgG.IgM and CD19/CD20.    2.   continue gabapentin for the dysesthesias.  Tamsulosin for urinary hesitancy 3.   Continue Adderall for MS related ADD 4.   Continue Emgality for chronic migraine   Maxalt or Tylenol for breakthrough 5.    she will return in 6 weeks or sooner if there are new or worsening neurologic symptoms.

## 2020-05-19 LAB — IGG, IGA, IGM
IgA/Immunoglobulin A, Serum: 123 mg/dL (ref 87–352)
IgG (Immunoglobin G), Serum: 1000 mg/dL (ref 586–1602)
IgM (Immunoglobulin M), Srm: 42 mg/dL (ref 26–217)

## 2020-05-19 LAB — CD19 AND CD20, FLOW CYTOMETRY

## 2020-08-04 ENCOUNTER — Encounter: Payer: Self-pay | Admitting: *Deleted

## 2020-08-04 ENCOUNTER — Other Ambulatory Visit: Payer: Self-pay

## 2020-08-04 ENCOUNTER — Ambulatory Visit
Admission: EM | Admit: 2020-08-04 | Discharge: 2020-08-04 | Disposition: A | Discharge status: Home / Self Care | Payer: BC Managed Care – PPO | Attending: Internal Medicine | Admitting: Internal Medicine

## 2020-08-04 DIAGNOSIS — Z79899 Other long term (current) drug therapy: Secondary | ICD-10-CM | POA: Diagnosis not present

## 2020-08-04 DIAGNOSIS — Z113 Encounter for screening for infections with a predominantly sexual mode of transmission: Secondary | ICD-10-CM | POA: Diagnosis not present

## 2020-08-04 DIAGNOSIS — M5442 Lumbago with sciatica, left side: Secondary | ICD-10-CM

## 2020-08-04 DIAGNOSIS — Z8616 Personal history of COVID-19: Secondary | ICD-10-CM | POA: Diagnosis not present

## 2020-08-04 DIAGNOSIS — R8290 Unspecified abnormal findings in urine: Secondary | ICD-10-CM | POA: Diagnosis present

## 2020-08-04 DIAGNOSIS — Z880 Allergy status to penicillin: Secondary | ICD-10-CM | POA: Insufficient documentation

## 2020-08-04 LAB — POCT URINALYSIS DIP (MANUAL ENTRY)
Bilirubin, UA: NEGATIVE
Blood, UA: NEGATIVE
Glucose, UA: NEGATIVE mg/dL
Ketones, POC UA: NEGATIVE mg/dL
Leukocytes, UA: NEGATIVE
Nitrite, UA: NEGATIVE
Protein Ur, POC: NEGATIVE mg/dL
Spec Grav, UA: 1.015 (ref 1.010–1.025)
Urobilinogen, UA: 0.2 E.U./dL
pH, UA: 6.5 (ref 5.0–8.0)

## 2020-08-04 MED ORDER — KETOROLAC TROMETHAMINE 60 MG/2ML IM SOLN
60.0000 mg | Freq: Once | INTRAMUSCULAR | Status: AC
  Administered 2020-08-04: 60 mg via INTRAMUSCULAR

## 2020-08-04 NOTE — ED Provider Notes (Addendum)
[facility]    CSN: [id number] Arrival date & time: 08/04/20  0913      History   Chief Complaint Chief Complaint  Patient presents with   Back Pain    HPI Kelly Alvarado is a 45 y.o. female.   Presents for 2 to 70-month history of malodorous urine.  Patient states that she also has a left-sided lower back pain that started about 3 weeks ago.  Denies injury to back.  Patient states that she has mentioned her malodorous urine to her PCP and neurologist previously and when it first started.  Neurologist prescribed Macrobid on 05-02-2020 with no relief of symptoms.  Patient denies any fever at home.  Denies any dysuria, urinary frequency, vaginal irritation, vaginal discharge, vaginal itching.  Patient does mention urinary hesitancy and difficulty emptying bladder at time but states this is normal due to history of MS.  Denies any risky sexual behavior or exposure to STD.  Denies any pelvic pain or abdominal pain.  Denies any recent changes to her mobility.  Patient states that she no longer has menstrual cycles due to having complete hysterectomy.   Back Pain  Past Medical History:  Diagnosis Date   Allergy    Anemia    HEAVY PERIODS   Headache(784.0)    MIGRAINES   Multiple sclerosis ([facility])    Neuromuscular disorder ([place])    MS   Pulmonary embolism ([place]) 2013   lt-?bcp   Wears glasses     Patient Active Problem List   Diagnosis Date Noted   COVID-19 02/11/2020   Convulsions ([place]) 02/29/2016   Menorrhagia 12/27/2015   Chronic migraine 09/02/2015   Dysesthesia 09/02/2015   Obesity 09/02/2015   Common migraine without intractability 05/06/2014   High risk medication use 05/06/2014   Visual disturbance 05/06/2014   Other fatigue 05/06/2014   Insomnia 05/06/2014   Myalgia and myositis 05/06/2014   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   Pulmonary embolism ([place]) 07/17/2011   Multiple sclerosis ([place]) 07/17/2011    Past Surgical History:  Procedure  Laterality Date   ABDOMINAL HYSTERECTOMY     BILATERAL SALPINGECTOMY Bilateral 12/27/2015   Procedure: BILATERAL SALPINGECTOMY;  Surgeon: Everett Graff, MD;  Location: [facility];  Service: Gynecology;  Laterality: Bilateral;  Excision of paratubal cyst   BREAST LUMPECTOMY WITH RADIOACTIVE SEED LOCALIZATION Left 10/06/2013   Procedure:  RADIOACTIVE SEED LOCALIZATION LEFT BREAST LUMPECTOMY;  Surgeon: Odis Hollingshead, MD;  Location: [facility];  Service: General;  Laterality: Left;   BREAST REDUCTION SURGERY     BREAST SURGERY     CYSTOSCOPY N/A 12/27/2015   Procedure: CYSTOSCOPY;  Surgeon: Everett Graff, MD;  Location: [facility];  Service: Gynecology;  Laterality: N/A;   REPAIR VAGINAL CUFF N/A 01/09/2016   Procedure: REPAIR VAGINAL CUFF;  Surgeon: Everett Graff, MD;  Location: [facility];  Service: Gynecology;  Laterality: N/A;   VAGINAL HYSTERECTOMY Bilateral 12/27/2015   Procedure: HYSTERECTOMY VAGINAL;  Surgeon: Everett Graff, MD;  Location: [facility];  Service: Gynecology;  Laterality: Bilateral;   WISDOM TOOTH EXTRACTION      OB History     Gravida  5   Para  3   Term      Preterm      AB      Living  3      SAB      IAB      Ectopic      Multiple      Live Births  Home Medications    Prior to Admission medications   Medication Sig Start Date End Date Taking? Authorizing Provider  amphetamine-dextroamphetamine (ADDERALL XR) 25 MG 24 hr capsule Take 1 capsule by mouth daily. 05/02/20  Yes Sater, Nanine Means, MD  EMGALITY 120 MG/ML SOAJ INJECT 1 PEN INTO THE SKIN EVERY 28 DAYS. 03/07/20  Yes Sater, Nanine Means, MD  gabapentin (NEURONTIN) 300 MG capsule TAKE 1 CAPSULE IN AM, AND 1 CAP IN EVENING, AND 2 AT BEDTIME 03/08/20  Yes Sater, Nanine Means, MD  levocetirizine (XYZAL) 5 MG tablet TAKE 1 TABLET BY MOUTH EVERY DAY EVERY EVENING 04/04/20  Yes [name], FNP  ocrelizumab (OCREVUS) 300 MG/10ML injection Inject 600 mg into the vein every 6 (six)  months.   Yes [provider]  Nirmatrelvir & Ritonavir 20 x 150 MG & 10 x 100MG  TBPK TAKE 3 TABLETS BY MOUTH 2 (TWO) TIMES DAILY FOR 5 DAYS. 02/13/20 02/12/21  Earlie Counts, NP  nitrofurantoin (MACRODANTIN) 100 MG capsule Take 1 capsule (100 mg total) by mouth 2 (two) times daily. Patient not taking: No sig reported 05/02/20   Britt Bottom, MD  rizatriptan (MAXALT-MLT) 10 MG disintegrating tablet Take 1 tablet (10 mg total) by mouth as needed for migraine. May repeat in 2 hours if needed 08/12/19   Sater, Nanine Means, MD  Vitamin D, Ergocalciferol, (DRISDOL) 1.25 MG (50000 UNIT) CAPS capsule TAKE 1 CAPSULE (50,000 UNITS TOTAL) BY MOUTH 2 (TWO) TIMES A WEEK. 03/10/20   Sater, Nanine Means, MD    Family History Family History  Problem Relation Age of Onset   Asthma Mother    Hypertension Father    Cancer Paternal Uncle    Cancer Paternal Grandfather     Social History Social History   Tobacco Use   Smoking status: Never   Smokeless tobacco: Never  Vaping Use   Vaping Use: Never used  Substance Use Topics   Alcohol use: Not Currently   Drug use: No     Allergies   Amoxicillin and Milk-related compounds   Review of Systems Review of Systems Per HPI  Physical Exam Triage Vital Signs ED Triage Vitals  Enc Vitals Group     BP 08/04/20 0952 134/83     Pulse Rate 08/04/20 0952 65     Resp 08/04/20 0952 20     Temp 08/04/20 0952 98.3 F (36.8 C)     Temp Source 08/04/20 0952 Oral     SpO2 08/04/20 0952 100 %     Weight --      Height --      Head Circumference --      Peak Flow --      Pain Score 08/04/20 0953 4     Pain Loc --      Pain Edu? --      Excl. in [place]? --    No data found.  Updated Vital Signs BP 134/83 (BP Location: Left Arm)   Pulse 65   Temp 98.3 F (36.8 C) (Oral)   Resp 20   LMP 12/11/2015 (Approximate)   SpO2 100%   Visual Acuity Right Eye Distance:   Left Eye Distance:   Bilateral Distance:    Right Eye Near:   Left Eye Near:     Bilateral Near:     Physical Exam Constitutional:      Appearance: Normal appearance.  HENT:     Head: Normocephalic and atraumatic.  Eyes:     Extraocular Movements: Extraocular  movements intact.     Conjunctiva/sclera: Conjunctivae normal.  Cardiovascular:     Rate and Rhythm: Normal rate and regular rhythm.     Pulses: Normal pulses.     Heart sounds: Normal heart sounds.  Pulmonary:     Effort: Pulmonary effort is normal.     Breath sounds: Normal breath sounds.  Abdominal:     General: Abdomen is flat. There is no distension.     Palpations: Abdomen is soft.     Tenderness: There is no abdominal tenderness. There is no guarding or rebound.  Genitourinary:    Comments: Deferred.  Self swab performed. Musculoskeletal:     Cervical back: Normal.     Thoracic back: Normal.     Lumbar back: Tenderness present. No swelling or signs of trauma. Normal range of motion.     Comments: No tenderness to palpation to left lower back.  She states that pain is "deeper".  Neurological:     General: No focal deficit present.     Mental Status: She is alert and oriented to person, place, and time. Mental status is at baseline.     Deep Tendon Reflexes: Reflexes are normal and symmetric.  Psychiatric:        Mood and Affect: Mood normal.        Behavior: Behavior normal.        Thought Content: Thought content normal.        Judgment: Judgment normal.     UC Treatments / Results  Labs (all labs ordered are listed, but only abnormal results are displayed) Labs Reviewed  URINE CULTURE  POCT URINALYSIS DIP (MANUAL ENTRY)  CERVICOVAGINAL ANCILLARY ONLY    EKG   Radiology No results found.  Procedures Procedures (including critical care time)  Medications Ordered in UC Medications  ketorolac (TORADOL) injection 60 mg (60 mg Intramuscular Given 08/04/20 1040)    Initial Impression / Assessment and Plan / UC Course  I have reviewed the triage vital signs and the nursing  notes.  Pertinent labs & imaging results that were available during my care of the patient were reviewed by me and considered in my medical decision making (see chart for details).     Urine culture and vaginal swab pending.  Urinalysis not definitive for urinary tract infection.  Will await urine culture and vaginal swab results before proceeding with treatment.  Patient was agreeable to plan.  Suspicious that malodorous urine and lower back pain are not related.  Lower back pain radiates down left leg and is consistent with sciatica.  Ketorolac injection administered in office for pain.  Patient was advised to not take any additional NSAIDs over-the-counter for at least 24 hours.  May take Tylenol.  Ice application to back.  Kidney stones not as likely due to physical exam and patient history.  Although patient was advised to go to the hospital if back pain or any symptoms significantly worsen.  Patient to follow-up with primary care physician and neurologist regarding urine symptoms and lower back pain.  Discussed strict return precautions. Patient verbalized understanding and is agreeable with plan.  Final Clinical Impressions(s) / UC Diagnoses   Final diagnoses:  Malodorous urine  Acute left-sided low back pain with left-sided sciatica     Discharge Instructions      Urine culture and vaginal swab pending.  We will call if results are positive and treat as appropriate.  Please go to the hospital emergency department if back pain significantly worsens or symptoms  change. Please follow-up with primary care physician and neurologist regarding back pain and if symptoms continue.  Please do not take any ibuprofen or Advil or Aleve over-the-counter for 24 hours following ketorolac injection.  May take Tylenol as needed.  May use ice application to back for approximately 15 minutes at a time 2-3 times daily.     ED Prescriptions   None    PDMP not reviewed this encounter.   Odis Luster, FNP 08/04/20 1115    Odis Luster, FNP 08/04/20 [place], [place], FNP 08/04/20 [phone]

## 2020-08-04 NOTE — ED Triage Notes (Signed)
C/O malodorous urine x approx 2 months; states she mentioned to PCP and neurologist, "but they missed doing anything about it".  C/O low back pain x approx 2 wks.  Denies polyuria, fevers, dysuria.  States normally has difficulty emptying bladder due to MS.

## 2020-08-04 NOTE — Discharge Instructions (Addendum)
Urine culture and vaginal swab pending.  We will call if results are positive and treat as appropriate.  Please go to the hospital emergency department if back pain significantly worsens or symptoms change. Please follow-up with primary care physician and neurologist regarding back pain and if symptoms continue.  Please do not take any ibuprofen or Advil or Aleve over-the-counter for 24 hours following ketorolac injection.  May take Tylenol as needed.  May use ice application to back for approximately 15 minutes at a time 2-3 times daily.

## 2020-08-05 LAB — CERVICOVAGINAL ANCILLARY ONLY
Bacterial Vaginitis (gardnerella): NEGATIVE
Candida Glabrata: NEGATIVE
Candida Vaginitis: NEGATIVE
Chlamydia: NEGATIVE
Comment: NEGATIVE
Comment: NEGATIVE
Comment: NEGATIVE
Comment: NEGATIVE
Comment: NEGATIVE
Comment: NORMAL
Neisseria Gonorrhea: NEGATIVE
Trichomonas: NEGATIVE

## 2020-08-07 LAB — URINE CULTURE: Culture: >=100000 — AB

## 2020-08-08 ENCOUNTER — Telehealth (HOSPITAL_COMMUNITY): Payer: Self-pay | Admitting: Emergency Medicine

## 2020-08-08 MED ORDER — NITROFURANTOIN MONOHYD MACRO 100 MG PO CAPS: 100.0000 mg | ORAL_CAPSULE | Freq: Two times a day (BID) | ORAL | 0 refills | Status: DC

## 2020-08-25 ENCOUNTER — Other Ambulatory Visit: Payer: Self-pay

## 2020-10-04 ENCOUNTER — Telehealth: Payer: BC Managed Care – PPO | Admitting: Nurse Practitioner

## 2020-10-04 ENCOUNTER — Other Ambulatory Visit: Payer: Self-pay

## 2020-10-04 ENCOUNTER — Encounter: Payer: Self-pay | Admitting: Nurse Practitioner

## 2020-10-04 VITALS — BP 106/70 | HR 72 | Ht 63.0 in

## 2020-10-04 DIAGNOSIS — U071 COVID-19: Secondary | ICD-10-CM

## 2020-10-04 DIAGNOSIS — G35 Multiple sclerosis: Secondary | ICD-10-CM

## 2020-10-04 DIAGNOSIS — R059 Cough, unspecified: Secondary | ICD-10-CM

## 2020-10-04 MED ORDER — BENZONATATE 100 MG PO CAPS: 100.0000 mg | ORAL_CAPSULE | Freq: Four times a day (QID) | ORAL | 1 refills | Status: DC | PRN

## 2020-10-04 MED ORDER — HYDROCODONE BIT-HOMATROP MBR 5-1.5 MG/5ML PO SOLN: 5.0000 mL | Freq: Four times a day (QID) | ORAL | 0 refills | Status: DC | PRN

## 2020-10-04 NOTE — Patient Instructions (Signed)

## 2020-10-04 NOTE — Progress Notes (Signed)
Virtual Visit via MyChart   This visit type was conducted due to national recommendations for restrictions regarding the COVID-19 Pandemic (e.g. social distancing) in an effort to limit this patient's exposure and mitigate transmission in our community.  Due to her co-morbid illnesses, this patient is at least at moderate risk for complications without adequate follow up.  This format is felt to be most appropriate for this patient at this time.  All issues noted in this document were discussed and addressed.  A limited physical exam was performed with this format.    This visit type was conducted due to national recommendations for restrictions regarding the COVID-19 Pandemic (e.g. social distancing) in an effort to limit this patient's exposure and mitigate transmission in our community.  Patients identity confirmed using two different identifiers.  This format is felt to be most appropriate for this patient at this time.  All issues noted in this document were discussed and addressed.  No physical exam was performed (except for noted visual exam findings with Video Visits).    Date:  10/04/2020   ID:  Kelly Alvarado, DOB [birth year], MRN [id number]  Patient Location:  Home - spoke with Megan Mans  Provider location:   Office    Chief Complaint:  positive covid  History of Present Illness:    Kelly Alvarado is a 45 y.o. female who presents via video conferencing for a telehealth visit today.    The patient does have symptoms concerning for COVID-19 infection (fever, chills, cough, or new shortness of breath).   She started with symptoms on Saturday night felt fatigued while at the [employer] game. She has taken ibuprofen/acetaminophen for fever. Continues to have her sense of taste and smell.     Past Medical History:  Diagnosis Date   Allergy    Anemia    HEAVY PERIODS   Headache(784.0)    MIGRAINES   Multiple sclerosis ([place])    Neuromuscular disorder ([place])    MS    Pulmonary embolism ([place]) 2013   lt-?bcp   Wears glasses    Past Surgical History:  Procedure Laterality Date   ABDOMINAL HYSTERECTOMY     BILATERAL SALPINGECTOMY Bilateral 12/27/2015   Procedure: BILATERAL SALPINGECTOMY;  Surgeon: Everett Graff, MD;  Location: [facility];  Service: Gynecology;  Laterality: Bilateral;  Excision of paratubal cyst   BREAST LUMPECTOMY WITH RADIOACTIVE SEED LOCALIZATION Left 10/06/2013   Procedure:  RADIOACTIVE SEED LOCALIZATION LEFT BREAST LUMPECTOMY;  Surgeon: Odis Hollingshead, MD;  Location: [facility];  Service: General;  Laterality: Left;   BREAST REDUCTION SURGERY     BREAST SURGERY     CYSTOSCOPY N/A 12/27/2015   Procedure: CYSTOSCOPY;  Surgeon: Everett Graff, MD;  Location: [facility];  Service: Gynecology;  Laterality: N/A;   REPAIR VAGINAL CUFF N/A 01/09/2016   Procedure: REPAIR VAGINAL CUFF;  Surgeon: Everett Graff, MD;  Location: [facility];  Service: Gynecology;  Laterality: N/A;   VAGINAL HYSTERECTOMY Bilateral 12/27/2015   Procedure: HYSTERECTOMY VAGINAL;  Surgeon: Everett Graff, MD;  Location: [facility];  Service: Gynecology;  Laterality: Bilateral;   WISDOM TOOTH EXTRACTION       Current Meds  Medication Sig   amphetamine-dextroamphetamine (ADDERALL XR) 25 MG 24 hr capsule Take 1 capsule by mouth daily.   benzonatate (TESSALON PERLES) 100 MG capsule Take 1 capsule (100 mg total) by mouth every 6 (six) hours as needed.   EMGALITY 120 MG/ML SOAJ INJECT 1 PEN INTO THE SKIN EVERY 28 DAYS.  gabapentin (NEURONTIN) 300 MG capsule TAKE 1 CAPSULE IN AM, AND 1 CAP IN EVENING, AND 2 AT BEDTIME   HYDROcodone bit-homatropine (HYDROMET) 5-1.5 MG/5ML syrup Take 5 mLs by mouth every 6 (six) hours as needed for cough.   levocetirizine (XYZAL) 5 MG tablet TAKE 1 TABLET BY MOUTH EVERY DAY EVERY EVENING   ocrelizumab (OCREVUS) 300 MG/10ML injection Inject 600 mg into the vein every 6 (six) months.   rizatriptan (MAXALT-MLT) 10 MG disintegrating tablet  Take 1 tablet (10 mg total) by mouth as needed for migraine. May repeat in 2 hours if needed   Vitamin D, Ergocalciferol, (DRISDOL) 1.25 MG (50000 UNIT) CAPS capsule TAKE 1 CAPSULE (50,000 UNITS TOTAL) BY MOUTH 2 (TWO) TIMES A WEEK.   [DISCONTINUED] Nirmatrelvir & Ritonavir 20 x 150 MG & 10 x '100MG'$  TBPK TAKE 3 TABLETS BY MOUTH 2 (TWO) TIMES DAILY FOR 5 DAYS.   [DISCONTINUED] nitrofurantoin, macrocrystal-monohydrate, (MACROBID) 100 MG capsule Take 1 capsule (100 mg total) by mouth 2 (two) times daily.     Allergies:   Amoxicillin and Milk-related compounds   Social History   Tobacco Use   Smoking status: Never   Smokeless tobacco: Never  Vaping Use   Vaping Use: Never used  Substance Use Topics   Alcohol use: Not Currently   Drug use: No     Family Hx: The patient's family history includes Asthma in her mother; Cancer in her paternal grandfather and paternal uncle; Hypertension in her father.  ROS:   Please see the history of present illness.    Review of Systems  Constitutional:  Positive for chills, fever and malaise/fatigue.  Respiratory:  Positive for cough (productive) and shortness of breath (winded easily). Negative for sputum production and wheezing.   Cardiovascular:  Negative for chest pain.  Gastrointestinal:  Negative for constipation.  Musculoskeletal:  Positive for joint pain and myalgias.  Neurological:  Positive for headaches.  Psychiatric/Behavioral: Negative.     All other systems reviewed and are negative.   Labs/Other Tests and Data Reviewed:    Recent Labs: 04/21/2020: ALT 7; BUN 7; Creatinine, Ser 0.85; Hemoglobin 12.4; Platelets 285; Potassium 4.0; Sodium 139; TSH 1.730   Recent Lipid Panel Lab Results  Component Value Date/Time   CHOL 202 (H) 04/21/2020 09:24 AM   TRIG 79 04/21/2020 09:24 AM   HDL 82 04/21/2020 09:24 AM   CHOLHDL 2.5 04/21/2020 09:24 AM   CHOLHDL 2.8 10/11/2008 03:30 AM   LDLCALC 106 (H) 04/21/2020 09:24 AM    Wt Readings from  Last 3 Encounters:  05/16/20 154 lb 8 oz (70.1 kg)  04/21/20 153 lb 3.2 oz (69.5 kg)  08/12/19 159 lb 8 oz (72.3 kg)     Exam:    Vital Signs:  BP 106/70   Pulse 72   Ht '5\' 3"'$  (1.6 m)   LMP 12/11/2015 (Approximate)   BMI 27.37 kg/m     Physical Exam  ASSESSMENT & PLAN:    1. COVID-19 Advised patient to take Vitamin C, D, Zinc.  Keep yourself hydrated with a lot of water and rest. Take Delsym for cough and Mucinex as need. Take Tylenol or pain reliever every 4-6 hours as needed for pain/fever/body ache. If you have elevated blood pressure, you can take OTC Corcidin. You can also take OTC oscillococcinum to help with your symptoms.  Educated patient if symptoms get worse or if she experiences any SOB, chest pain or pain in her legs to seek immediate emergency care. Continue to monitor  your oxygen levels. Call us if you have any questions. Quarantine for 5 days if tested positive and no symptoms or 10 days if tested positive and have symptoms. Wear a mask around other people.  Encouraged to take aspirin 81 mg daily due to history of pulmonary embolism - bebtelovimab EUA injection SOLN 175 mg - 0.9 %  sodium chloride infusion - Hypersensitivity GRADE 1: Transient flushing or rash, or drug fever < 100.4 F - Hypersensitivity GRADE 2: Rash, flushing, urticaria, dyspnea, or drug fever = or > 100.4 F - Hypersensitivity GRADE 3: symptomatic bronchospasm, with or without urticaria, parenteral medication management indicated, allergy-related edema/angioedema, or hypotension - Hypersensitivity GRADE 4: Anaphylaxis - diphenhydrAMINE (BENADRYL) injection 50 mg - famotidine (PEPCID) 20 mg in sodium chloride 0.9 % 50 mL IVPB - methylPREDNISolone sodium succinate (SOLU-MEDROL) 130 mg in sodium chloride 0.9 % 50 mL IVPB - albuterol (VENTOLIN HFA) 108 (90 Base) MCG/ACT inhaler 2 puff - EPINEPHrine (EPI-PEN) injection 0.3 mg  2. Cough Advised to avoid operating heavy machinery or driving when taking  Hydromet - HYDROcodone bit-homatropine (HYDROMET) 5-1.5 MG/5ML syrup; Take 5 mLs by mouth every 6 (six) hours as needed for cough.  Dispense: 120 mL; Refill: 0 - benzonatate (TESSALON PERLES) 100 MG capsule; Take 1 capsule (100 mg total) by mouth every 6 (six) hours as needed.  Dispense: 30 capsule; Refill: 1  3. Multiple sclerosis ([place]) Will refer for MAB infusion due to history of MS     COVID-19 Education: The signs and symptoms of COVID-19 were discussed with the patient and how to seek care for testing (follow up with PCP or arrange E-visit).  The importance of social distancing was discussed today.  Patient Risk:   After full review of this patients clinical status, I feel that they are at least moderate risk at this time.  Time:   Today, I have spent 11.30 minutes/ seconds with the patient with telehealth technology discussing above diagnoses.     Medication Adjustments/Labs and Tests Ordered: Current medicines are reviewed at length with the patient today.  Concerns regarding medicines are outlined above.   Tests Ordered: No orders of the defined types were placed in this encounter.   Medication Changes: Meds ordered this encounter  Medications   HYDROcodone bit-homatropine (HYDROMET) 5-1.5 MG/5ML syrup    Sig: Take 5 mLs by mouth every 6 (six) hours as needed for cough.    Dispense:  120 mL    Refill:  0   benzonatate (TESSALON PERLES) 100 MG capsule    Sig: Take 1 capsule (100 mg total) by mouth every 6 (six) hours as needed.    Dispense:  30 capsule    Refill:  1    Disposition:  Follow up prn  Signed, [name], FNP

## 2020-10-05 ENCOUNTER — Telehealth: Payer: Self-pay

## 2020-10-05 ENCOUNTER — Encounter: Payer: Self-pay | Admitting: Nurse Practitioner

## 2020-10-05 ENCOUNTER — Ambulatory Visit (INDEPENDENT_AMBULATORY_CARE_PROVIDER_SITE_OTHER): Payer: BC Managed Care – PPO

## 2020-10-05 DIAGNOSIS — U071 COVID-19: Secondary | ICD-10-CM

## 2020-10-05 MED ORDER — METHYLPREDNISOLONE SODIUM SUCC 125 MG IJ SOLR: 125.0000 mg | Freq: Once | INTRAMUSCULAR | Status: AC | PRN

## 2020-10-05 MED ORDER — DIPHENHYDRAMINE HCL 50 MG/ML IJ SOLN: 50.0000 mg | Freq: Once | INTRAMUSCULAR | Status: AC | PRN

## 2020-10-05 MED ORDER — BEBTELOVIMAB 175 MG/2 ML IV (EUA)
175.0000 mg | Freq: Once | INTRAMUSCULAR | Status: AC
Start: 2020-10-05 — End: 2020-10-05
  Administered 2020-10-05: 175 mg via INTRAVENOUS

## 2020-10-05 MED ORDER — FAMOTIDINE IN NACL 20-0.9 MG/50ML-% IV SOLN: 20.0000 mg | Freq: Once | INTRAVENOUS | Status: AC | PRN

## 2020-10-05 MED ORDER — SODIUM CHLORIDE 0.9 % IV SOLN: INTRAVENOUS | Status: AC | PRN

## 2020-10-05 MED ORDER — EPINEPHRINE 0.3 MG/0.3ML IJ SOAJ: 0.3000 mg | Freq: Once | INTRAMUSCULAR | Status: AC | PRN

## 2020-10-05 MED ORDER — ALBUTEROL SULFATE HFA 108 (90 BASE) MCG/ACT IN AERS: 2.0000 | INHALATION_SPRAY | Freq: Once | RESPIRATORY_TRACT | Status: AC | PRN

## 2020-10-05 NOTE — Progress Notes (Signed)
Diagnosis: COVID  Provider:  Marshell Garfinkel, MD  Procedure: Infusion  IV Type: Peripheral, IV Location: L Hand  Bebtelovimab, Dose: 175 mg  Infusion Start Time: [id number]  Infusion Stop Time: [id number]  Post Infusion IV Care: Observation period completed  Discharge: Condition: Good, Destination: Home . AVS provided to patient.   Performed by:  Josmar Messimer, Sherlon Handing, LPN

## 2020-10-05 NOTE — Patient Instructions (Addendum)
10 Things You Can Do to Manage Your COVID-19 Symptoms at Home If you have possible or confirmed COVID-19 Stay home except to get medical care. Monitor your symptoms carefully. If your symptoms get worse, call your healthcare provider immediately. Get rest and stay hydrated. If you have a medical appointment, call the healthcare provider ahead of time and tell them that you have or may have COVID-19. For medical emergencies, call 911 and notify the dispatch personnel that you have or may have COVID-19. Cover your cough and sneezes with a tissue or use the inside of your elbow. Wash your hands often with soap and water for at least 20 seconds or clean your hands with an alcohol-based hand sanitizer that contains at least 60% alcohol. As much as possible, stay in a specific room and away from other people in your home. Also, you should use a separate bathroom, if available. If you need to be around other people in or outside of the home, wear a mask. Avoid sharing personal items with other people in your household, like dishes, towels, and bedding. Clean all surfaces that are touched often, like counters, tabletops, and doorknobs. Use household cleaning sprays or wipes according to the label instructions. [email or web address] What types of side effects do monoclonal antibody drugs cause?  Common side effects  In general, the more common side effects caused by monoclonal antibody drugs include: Allergic reactions, such as hives or itching Flu-like signs and symptoms, including chills, fatigue, fever, and muscle aches and pains Nausea, vomiting Diarrhea Skin rashes Low blood pressure   The CDC is recommending patients who receive monoclonal antibody treatments wait at least 90 days before being vaccinated.  Currently, there are no data on the safety and efficacy of mRNA COVID-19 vaccines in persons who received monoclonal antibodies or convalescent plasma as part of COVID-19 treatment. Based on  the estimated half-life of such therapies as well as evidence suggesting that reinfection is uncommon in the 90 days after initial infection, vaccination should be deferred for at least 90 days, as a precautionary measure until additional information becomes available, to avoid interference of the antibody treatment with vaccine-induced immune responses.  Patient was given drug information sheet for Bebtelovimab.  Also given cost estimate sheet for Bebtelovimab.  Patient reviewed documentation and questions were answered.  Patient would like to proceed with treatment at this time.     08/21/2019 This information is not intended to replace advice given to you by your health care provider. Make sure you discuss any questions you have with your health care provider. Document Revised: 06/09/2020 Document Reviewed: 06/09/2020 Elsevier Patient Education  [place].

## 2020-10-05 NOTE — Telephone Encounter (Signed)
Called mobile and house number. No answer. Left a message on mobile to call back and schedule mab.

## 2020-11-06 ENCOUNTER — Other Ambulatory Visit: Payer: Self-pay

## 2020-11-07 MED ORDER — AMPHETAMINE-DEXTROAMPHET ER 25 MG PO CP24: 25.0000 mg | ORAL_CAPSULE | Freq: Every day | ORAL | 0 refills | Status: DC

## 2020-11-07 NOTE — Telephone Encounter (Signed)
Patient is up to date on her appointments. Pt is due for a refill on adderall. [place] Controlled Substance Registry checked and is appropriate.

## 2020-11-14 ENCOUNTER — Ambulatory Visit: Payer: BC Managed Care – PPO | Admitting: Neurology

## 2020-11-14 ENCOUNTER — Encounter: Payer: Self-pay | Admitting: Neurology

## 2020-11-14 ENCOUNTER — Telehealth: Payer: Self-pay | Admitting: Neurology

## 2020-11-14 VITALS — BP 90/65 | HR 59 | Ht 63.5 in | Wt 163.0 lb

## 2020-11-14 DIAGNOSIS — Z79899 Other long term (current) drug therapy: Secondary | ICD-10-CM

## 2020-11-14 DIAGNOSIS — H539 Unspecified visual disturbance: Secondary | ICD-10-CM | POA: Diagnosis not present

## 2020-11-14 DIAGNOSIS — G43009 Migraine without aura, not intractable, without status migrainosus: Secondary | ICD-10-CM

## 2020-11-14 DIAGNOSIS — R208 Other disturbances of skin sensation: Secondary | ICD-10-CM

## 2020-11-14 DIAGNOSIS — N399 Disorder of urinary system, unspecified: Secondary | ICD-10-CM

## 2020-11-14 DIAGNOSIS — G35 Multiple sclerosis: Secondary | ICD-10-CM

## 2020-11-14 MED ORDER — TAMSULOSIN HCL 0.4 MG PO CAPS: 0.4000 mg | ORAL_CAPSULE | Freq: Every day | ORAL | 3 refills | Status: DC

## 2020-11-14 MED ORDER — NITROFURANTOIN MONOHYD MACRO 100 MG PO CAPS: 100.0000 mg | ORAL_CAPSULE | Freq: Two times a day (BID) | ORAL | 0 refills | Status: DC

## 2020-11-14 NOTE — Progress Notes (Signed)
[facility]  PATIENT: Kelly Alvarado DOB: 10/29/1975  REFERRING DOCTOR OR PCP:  [name]  SOURCE: patient and records  _________________________________   HISTORICAL  CHIEF COMPLAINT:  Chief Complaint  Patient presents with   Follow-up    Pt alone, rm 1. MS Follow up, pt on ocrevus next infusion 12/22/2020, last infusion was 06/09/2020. Received through [facility]. Overall pt is stable but has noticed worsening with vision.     HISTORY OF PRESENT ILLNESS:  Kelly Alvarado is a 45 year old woman with relapsing remitting MS diagnosed in 2010 and h/o a seizure in 2017.  History of Present Illness: Update 10/10//2022: Her next Ocrevus infusion will be 12/22/2020    Since starting Ocrevus, she has felt lightheaded.    She denies any exacerbations.   However, she has noted some vision worsening.   She is noting squiggly lines on the right and acuity is worse.   She has seen ophthalmology and was otld she needs to see a retina specialist.  Does not have an appt.  Changes were gradual and painless.    Gait is doing well. She can go up and down stairs without the bannister.   She can wal > 3 miles.   No falls.   No clumsiness.   She has a little numbness in fingers and toes. Gabapentin helps the tingling.    She has mild left sided weakness,  spasticity and pain  She has had frequent UTIs and feels se does not completely empty.  She never tried the tamsulosin.    She notes fatigue and reduced focus/attention.   Adderall has helped a lot.   This was much better when she was on Tysabri.   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     No LOC spells or seizures recently.  Only seizure was in 2017 a few days after hysterectomy.    Chronic migraines are doing much better on Emgality.   This has worked better than any other medicationShe has breakthrough once or twice a month and then several the last week of each cycle - compared to 25+ days a  month before [employer].   She takes Tylenol and/or Maxalt for breakthrough headaches   In past tried topiramate, zonisamide, nortriptyline and NSAIDs without much benefit.    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     She had headache, sore throat, low grade fever .   She also had Covid-19 again August but it was mild    MS History:    She was diagnosed with multiple sclerosis in 2010 after presenting with a left foot drop. MRI of the brain was consistent with multiple sclerosis.   She was  started on Rebif. The MS was well controlled but she felt poorly on that medication. She transferred to [facility] and began to see me in 2012. She was placed on Tysabri for about a year and felt much better while on it. Her MS did well on Tysabri as well.  Unfortunately, she was JCV positive and after about a year she stopped Tysabri and started Gilenya. She has done fairly well on Gilenya with no definite exacerbations.  She switched to [place] in August 2020 after MRI showed new lesions.    IMAGING MRI brain 07/01/2018 shows multiple T2/flair hyperintense foci in the spinal cord, left  middle cerebellar peduncle and in the periventricular, juxtacortical and deep white matter of the hemispheres.  These are consistent with chronic demyelinating plaque associated with multiple sclerosis.  None of the foci enhances or appears to be acute.  However, the 2 foci in the spinal cord and several of the foci in the hemispheres were not present on the 2012 MRI.   There is a normal enhancement pattern and no acute findings.  MRI cervical spine 07/01/2018 shows T2 hyperintense foci within the spinal cord adjacent to C1-C2, C3-C4, C5 and T3.  These are consistent with chronic demyelinating plaque associated with multiple sclerosis.  None of the foci enhanced on the current study.  However, none of the 4 foci were noted on  the MRI from 2010     REVIEW OF SYSTEMS: Constitutional: No fevers, chills, sweats, or change in appetite.  She notes fatigue and insomnia. Eyes: No double vision, eye pain.  She has right visula loss over last year.   Ear, nose and throat: No hearing loss, ear pain, nasal congestion, sore throat Cardiovascular: No chest pain, palpitations Respiratory:  No shortness of breath at rest or with exertion.   No wheezes GastrointestinaI: No nausea, vomiting, diarrhea, abdominal pain, fecal incontinence Genitourinary:  No dysuria, urinary retention or frequency.  No nocturia. Musculoskeletal:  No neck pain, back .  Notes myalgias at times Integumentary: No rash, pruritus, skin lesions Neurological: as above Psychiatric: No depression at this time.  No anxiety Endocrine: No palpitations, diaphoresis, change in appetite, change in weigh or increased thirst Hematologic/Lymphatic:  No anemia, purpura, petechiae. Allergic/Immunologic: No itchy/runny eyes, nasal congestion, recent allergic reactions, rashes.  She has some food allergies.  ALLERGIES: Allergies  Allergen Reactions   Amoxicillin Hives    Has patient had a PCN reaction causing immediate rash, facial/tongue/throat swelling, SOB or lightheadedness with hypotension: Yes Has patient had a PCN reaction causing severe rash involving mucus membranes or skin necrosis: Yes Has patient had a PCN reaction that required hospitalization No Has patient had a PCN reaction occurring within the last 10 years: No If all of the above answers are "NO", then may proceed with Cephalosporin use.    Milk-Related Compounds Diarrhea and Nausea And Vomiting    Stomach cramps    HOME MEDICATIONS:  Current Outpatient Medications:    amphetamine-dextroamphetamine (ADDERALL XR) 25 MG 24 hr capsule, Take 1 capsule by mouth daily., Disp: 30 capsule, Rfl: 0   benzonatate (TESSALON PERLES) 100 MG capsule, Take 1 capsule (100 mg total) by mouth every 6 (six) hours  as needed., Disp: 30 capsule, Rfl: 1   EMGALITY 120 MG/ML SOAJ, INJECT 1 PEN INTO THE SKIN EVERY 28 DAYS., Disp: 3 mL, Rfl: 4   gabapentin (NEURONTIN) 300 MG capsule, TAKE 1 CAPSULE IN AM, AND 1 CAP IN EVENING, AND 2 AT BEDTIME, Disp: 360 capsule, Rfl: 1   HYDROcodone bit-homatropine (HYDROMET) 5-1.5 MG/5ML syrup, Take 5 mLs by mouth every 6 (six) hours as needed for cough., Disp: 120 mL, Rfl: 0   levocetirizine (XYZAL) 5 MG tablet, TAKE 1 TABLET BY MOUTH EVERY DAY EVERY EVENING, Disp: 90 tablet, Rfl: 1   nitrofurantoin, macrocrystal-monohydrate, (MACROBID) 100 MG capsule, Take 1 capsule (100 mg total) by mouth 2 (two) times daily., Disp: 28 capsule, Rfl: 0   ocrelizumab (OCREVUS) 300 MG/10ML injection, Inject 600 mg into the vein every 6 (six) months., Disp: , Rfl:    rizatriptan (MAXALT-MLT) 10 MG disintegrating tablet, Take 1 tablet (10 mg total) by mouth  as needed for migraine. May repeat in 2 hours if needed, Disp: 27 tablet, Rfl: 3   tamsulosin (FLOMAX) 0.4 MG CAPS capsule, Take 1 capsule (0.4 mg total) by mouth daily., Disp: 90 capsule, Rfl: 3   Vitamin D, Ergocalciferol, (DRISDOL) 1.25 MG (50000 UNIT) CAPS capsule, TAKE 1 CAPSULE (50,000 UNITS TOTAL) BY MOUTH 2 (TWO) TIMES A WEEK., Disp: 26 capsule, Rfl: 1  Current Facility-Administered Medications:    0.9 %  sodium chloride infusion, , Intravenous, PRN, [name], FNP  PAST MEDICAL HISTORY: Past Medical History:  Diagnosis Date   Allergy    Anemia    HEAVY PERIODS   Headache(784.0)    MIGRAINES   Multiple sclerosis ([place])    Neuromuscular disorder ([place])    MS   Pulmonary embolism ([place]) 2013   lt-?bcp   Wears glasses     PAST SURGICAL HISTORY: Past Surgical History:  Procedure Laterality Date   ABDOMINAL HYSTERECTOMY     BILATERAL SALPINGECTOMY Bilateral 12/27/2015   Procedure: BILATERAL SALPINGECTOMY;  Surgeon: Everett Graff, MD;  Location: [facility];  Service: Gynecology;  Laterality: Bilateral;  Excision of paratubal  cyst   BREAST LUMPECTOMY WITH RADIOACTIVE SEED LOCALIZATION Left 10/06/2013   Procedure:  RADIOACTIVE SEED LOCALIZATION LEFT BREAST LUMPECTOMY;  Surgeon: Odis Hollingshead, MD;  Location: [facility];  Service: General;  Laterality: Left;   BREAST REDUCTION SURGERY     BREAST SURGERY     CYSTOSCOPY N/A 12/27/2015   Procedure: CYSTOSCOPY;  Surgeon: Everett Graff, MD;  Location: [facility];  Service: Gynecology;  Laterality: N/A;   REPAIR VAGINAL CUFF N/A 01/09/2016   Procedure: REPAIR VAGINAL CUFF;  Surgeon: Everett Graff, MD;  Location: [facility];  Service: Gynecology;  Laterality: N/A;   VAGINAL HYSTERECTOMY Bilateral 12/27/2015   Procedure: HYSTERECTOMY VAGINAL;  Surgeon: Everett Graff, MD;  Location: [facility];  Service: Gynecology;  Laterality: Bilateral;   WISDOM TOOTH EXTRACTION      FAMILY HISTORY: Family History  Problem Relation Age of Onset   Asthma Mother    Hypertension Father    Cancer Paternal Uncle    Cancer Paternal Grandfather        DIAGNOSTIC DATA (LABS, IMAGING, TESTING) - I reviewed patient records, labs, notes, testing and imaging myself where available.  Lab Results  Component Value Date   WBC 3.9 04/21/2020   HGB 12.4 04/21/2020   HCT 36.7 04/21/2020   MCV 93 04/21/2020   PLT 285 04/21/2020   ______________________________________________________________________________ Physical Exam    Today's Vitals   11/14/20 0829  BP: 90/65  Pulse: (!) 59  Weight: 163 lb (73.9 kg)  Height: 5' 3.5" (1.613 m)   Body mass index is 28.42 kg/m.    OD   20/200 OS 20/25 General: The patient is well-developed and well-nourished and in no acute distress   Musculoskeletal:  Neck has good ROM and is nontender     Neurologic Exam   Mental status: The patient is alert and oriented x 3 at the time of the examination. The patient has apparent normal recent and remote memory, with an apparently normal attention span and concentration ability.   Speech is  normal.   Cranial nerves: Extraocular movements are full.  Facial strength and sensation are normal.     No obvious hearing deficits are noted.   Motor:  Muscle bulk is normal.   Tone is normal. Strength is  5 / 5 in all 4 extremities.    Sensory: He has intact sensation  to touch and vibration in the arms and legs.   Coordination: Cerebellar testing reveals good finger-nose-finger and heel-to-shin bilaterally.   Gait and station: Station is normal.   Gait is normal. The tandem gait is mildly wide. Romberg is negative.    Reflexes: Deep tendon reflexes are symmetric and normal bilaterally    ASSESSMENT AND PLAN    1. Visual disturbance   2. Multiple sclerosis ([place])   3. Dysesthesia   4. High risk medication use   5. Common migraine without intractability   6. Urinary disorder       1.   Continue Ocrevus.  Labs were fine last visit.   Ext visit we will recheck IgG/IgM and CBCD 2.   Probable UTI.  Wll sendin macrodantin and check UA, UCx.  Tamsulosin for urinary hesitancy 3.   Continue Adderall for MS related ADD 4.   VIsion is reduced OD - she saw optomety and was told she should see a retina specialist.   She had been on Gilenya in the past but on Ocrevus x 2 years now 5.   Continue Emgality for chronic migraine   Maxalt or Tylenol for breakthrough 6. .    she will return in 6 weeks or sooner if there are new or worsening neurologic symptoms.  42-minute office visit with the majority of the time spent face-to-face for history and physical, discussion/counseling and decision-making.  Additional time with record review and documentation.   Claudio Mondry A. Felecia Shelling, MD, PhD, FAAN Certified in Neurology, Clinical Neurophysiology, Sleep Medicine, Pain Medicine and Neuroimaging Director, [place] at [facility] [address], [place], [place] [address] [phone]

## 2020-11-14 NOTE — Telephone Encounter (Signed)
Pt wanted it to be noted that she cannot do the urine test that was ordered for her at today's visit as she cannot produce urine.

## 2020-11-14 NOTE — Telephone Encounter (Signed)
Called pt. She could not produce sample while here. She will plan to come by later today or tomorrow for collection for probable UTI. Advised this is to ensure Dr. Felecia Shelling is treating her correctly. She verbalized understanding.

## 2020-12-14 NOTE — Telephone Encounter (Signed)
Dr. Zadie Rhine reviewed the referral and per [facility] with Dr. Zadie Rhine office,  "Per review of noted this patient needs to see a general eye physician instead of a retinal specialist. Thank you."

## 2020-12-14 NOTE — Telephone Encounter (Signed)
I have the patient scheduled at [facility] for 01/09/21 for 2:15 pm. Patient is aware of time and day. I faxed the order to [phone]. She is seeing Princess Bruins, MD.   Patient also stated she would like to see a new ophthalmologist, can you put a new referral in for a ophthalmologist.

## 2021-01-06 ENCOUNTER — Other Ambulatory Visit: Payer: Self-pay | Admitting: Neurology

## 2021-01-10 ENCOUNTER — Other Ambulatory Visit: Payer: Self-pay | Admitting: Neurology

## 2021-01-10 MED ORDER — AMPHETAMINE-DEXTROAMPHET ER 25 MG PO CP24: 25.0000 mg | ORAL_CAPSULE | Freq: Every day | ORAL | 0 refills | Status: DC

## 2021-01-10 NOTE — Telephone Encounter (Signed)
Received refill request for Adderall XR 25MG .  Last OV was on 11/14/20.  Next OV is scheduled for 05/16/21 .  Last RX was written on 11/07/20 for 30 tabs.   [place] [facility] Database has been reviewed.

## 2021-02-14 ENCOUNTER — Encounter: Payer: Self-pay | Admitting: Nurse Practitioner

## 2021-02-14 ENCOUNTER — Ambulatory Visit: Payer: BC Managed Care – PPO | Admitting: Nurse Practitioner

## 2021-02-14 ENCOUNTER — Other Ambulatory Visit: Payer: Self-pay

## 2021-02-14 VITALS — BP 122/74 | HR 81 | Temp 98.6°F | Ht 63.5 in | Wt 165.4 lb

## 2021-02-14 DIAGNOSIS — K649 Unspecified hemorrhoids: Secondary | ICD-10-CM

## 2021-02-14 DIAGNOSIS — M79605 Pain in left leg: Secondary | ICD-10-CM | POA: Diagnosis not present

## 2021-02-14 DIAGNOSIS — Z8616 Personal history of COVID-19: Secondary | ICD-10-CM

## 2021-02-14 DIAGNOSIS — M2550 Pain in unspecified joint: Secondary | ICD-10-CM | POA: Diagnosis not present

## 2021-02-14 DIAGNOSIS — R1084 Generalized abdominal pain: Secondary | ICD-10-CM | POA: Diagnosis not present

## 2021-02-14 DIAGNOSIS — Z6828 Body mass index (BMI) 28.0-28.9, adult: Secondary | ICD-10-CM

## 2021-02-14 DIAGNOSIS — L659 Nonscarring hair loss, unspecified: Secondary | ICD-10-CM | POA: Diagnosis not present

## 2021-02-14 DIAGNOSIS — H9202 Otalgia, left ear: Secondary | ICD-10-CM

## 2021-02-14 DIAGNOSIS — E7800 Pure hypercholesterolemia, unspecified: Secondary | ICD-10-CM

## 2021-02-14 MED ORDER — BETAMETHASONE DIPROPIONATE 0.05 % EX CREA: TOPICAL_CREAM | Freq: Two times a day (BID) | CUTANEOUS | 0 refills | Status: DC

## 2021-02-14 MED ORDER — FLUTICASONE PROPIONATE 50 MCG/ACT NA SUSP: 2.0000 | Freq: Every day | NASAL | 2 refills | Status: DC

## 2021-02-14 MED ORDER — WEGOVY 1 MG/0.5ML ~~LOC~~ SOAJ: 1.0000 mg | SUBCUTANEOUS | 0 refills | Status: DC

## 2021-02-14 MED ORDER — HYDROCORTISONE 1 % EX CREA: TOPICAL_CREAM | CUTANEOUS | 1 refills | Status: AC

## 2021-02-14 MED ORDER — DICYCLOMINE HCL 20 MG PO TABS: 20.0000 mg | ORAL_TABLET | Freq: Four times a day (QID) | ORAL | 1 refills | Status: DC

## 2021-02-14 MED ORDER — PREDNISONE 10 MG (21) PO TBPK: ORAL_TABLET | ORAL | 0 refills | Status: DC

## 2021-02-14 NOTE — Patient Instructions (Signed)

## 2021-02-14 NOTE — Progress Notes (Signed)
I,Katawbba Wiggins,acting as a Education administrator for [employer], FNP.,have documented all relevant documentation on the behalf of [name], FNP,as directed by  [name], FNP while in the presence of [name], [place].   This visit occurred during the SARS-CoV-2 public health emergency.  Safety protocols were in place, including screening questions prior to the visit, additional usage of staff PPE, and extensive cleaning of exam room while observing appropriate contact time as indicated for disinfecting solutions.  Subjective:     Patient ID: Kelly Alvarado , female    DOB: 19-Sep-1975 , 46 y.o.   MRN: [id number]   Chief Complaint  Patient presents with   Weight Check   Leg Pain    HPI  The patient is here today to discuss starting wegovy. She had been on Saxenda but is no longer taking. She has noticed her weight going up. She also has a spot on her leg she would like for you to look at. She does report that the area is gone.  Wt Readings from Last 3 Encounters: 02/14/21 : 165 lb 6.4 oz (75 kg) 11/14/20 : 163 lb (73.9 kg) 05/16/20 : 154 lb 8 oz (70.1 kg)  She is drinking approximately 2.5 bottles of water a day. She is walking occasionally, she ordered a treadmill. She has not taken Saxenda in approximately one year. 153 lbs to 165 lbs since not taking Saxenda.       Past Medical History:  Diagnosis Date   Allergy    Anemia    HEAVY PERIODS   Headache(784.0)    MIGRAINES   Multiple sclerosis ([facility])    Neuromuscular disorder ([facility])    MS   Pulmonary embolism ([place]) 2013   lt-?bcp   Wears glasses      Family History  Problem Relation Age of Onset   Asthma Mother    Hypertension Father    Cancer Paternal Uncle    Cancer Paternal Grandfather      Current Outpatient Medications:    amphetamine-dextroamphetamine (ADDERALL XR) 25 MG 24 hr capsule, Take 1 capsule by mouth daily., Disp: 30 capsule, Rfl: 0   betamethasone dipropionate 0.05 % cream, Apply topically 2 (two) times  daily., Disp: 30 g, Rfl: 0   dicyclomine (BENTYL) 20 MG tablet, Take 1 tablet (20 mg total) by mouth every 6 (six) hours., Disp: 90 tablet, Rfl: 1   EMGALITY 120 MG/ML SOAJ, INJECT 1 PEN INTO THE SKIN EVERY 28 DAYS., Disp: 3 mL, Rfl: 4   fluticasone (FLONASE) 50 MCG/ACT nasal spray, Place 2 sprays into both nostrils daily., Disp: 16 g, Rfl: 2   gabapentin (NEURONTIN) 300 MG capsule, TAKE 1 CAPSULE IN AM, AND 1 CAP IN EVENING, AND 2 AT BEDTIME, Disp: 360 capsule, Rfl: 1   hydrocortisone cream 1 %, Apply to affected area 2 times daily, Disp: 30 g, Rfl: 1   levocetirizine (XYZAL) 5 MG tablet, TAKE 1 TABLET BY MOUTH EVERY DAY EVERY EVENING, Disp: 90 tablet, Rfl: 1   ocrelizumab (OCREVUS) 300 MG/10ML injection, Inject 600 mg into the vein every 6 (six) months., Disp: , Rfl:    predniSONE (STERAPRED UNI-PAK 21 TAB) 10 MG (21) TBPK tablet, Take as directed, Disp: 21 tablet, Rfl: 0   rizatriptan (MAXALT-MLT) 10 MG disintegrating tablet, TAKE 1 TABLET BY MOUTH AS NEEDED FOR MIGRAINE. MAY REPEAT IN 2 HOURS IF NEEDED, Disp: 27 tablet, Rfl: 3   Semaglutide-Weight Management (WEGOVY) 1 MG/0.5ML SOAJ, Inject 1 mg into the skin once a week., Disp:  2 mL, Rfl: 0   Vitamin D, Ergocalciferol, (DRISDOL) 1.25 MG (50000 UNIT) CAPS capsule, TAKE 1 CAPSULE (50,000 UNITS TOTAL) BY MOUTH 2 (TWO) TIMES A WEEK., Disp: 26 capsule, Rfl: 1   tamsulosin (FLOMAX) 0.4 MG CAPS capsule, Take 1 capsule (0.4 mg total) by mouth daily. (Patient not taking: Reported on 02/14/2021), Disp: 90 capsule, Rfl: 3  Current Facility-Administered Medications:    0.9 %  sodium chloride infusion, , Intravenous, PRN, [name], FNP   Allergies  Allergen Reactions   Amoxicillin Hives    Has patient had a PCN reaction causing immediate rash, facial/tongue/throat swelling, SOB or lightheadedness with hypotension: Yes Has patient had a PCN reaction causing severe rash involving mucus membranes or skin necrosis: Yes Has patient had a PCN reaction  that required hospitalization No Has patient had a PCN reaction occurring within the last 10 years: No If all of the above answers are "NO", then may proceed with Cephalosporin use.    Milk-Related Compounds Diarrhea and Nausea And Vomiting    Stomach cramps   Yellow Dyes (Non-Tartrazine) Hives     Review of Systems  Constitutional: Negative.   Respiratory: Negative.    Cardiovascular: Negative.   Skin:        Reports having burn to her vaginal area skin after using nare.   Neurological:  Negative for dizziness and headaches.  Psychiatric/Behavioral: Negative.      Today's Vitals   02/14/21 1610  BP: 122/74  Pulse: 81  Temp: 98.6 F (37 C)  Weight: 165 lb 6.4 oz (75 kg)  Height: 5' 3.5" (1.613 m)  PainSc: 0-No pain   Body mass index is 28.84 kg/m.   Objective:  Physical Exam Vitals reviewed.  Constitutional:      General: She is not in acute distress.    Appearance: Normal appearance.  Cardiovascular:     Rate and Rhythm: Normal rate and regular rhythm.     Pulses: Normal pulses.     Heart sounds: Normal heart sounds. No murmur heard. Pulmonary:     Effort: Pulmonary effort is normal. No respiratory distress.     Breath sounds: Normal breath sounds. No wheezing.  Neurological:     General: No focal deficit present.     Mental Status: She is alert and oriented to person, place, and time.     Cranial Nerves: No cranial nerve deficit.     Motor: No weakness.  Psychiatric:        Mood and Affect: Mood normal.        Behavior: Behavior normal.        Thought Content: Thought content normal.        Judgment: Judgment normal.        Assessment And Plan:     1. Arthralgia, unspecified joint Comments: She has joint pain throughout at this time worse at her left knee. This affects her ability to exercise regularly as well.  - CBC  2. Hair loss Comments: May be due to recent covid but will check for metabolic cause. she does have hair thinning. Encouraged to  increase her water intake as well. Take MVI - TSH  3. Left leg pain - predniSONE (STERAPRED UNI-PAK 21 TAB) 10 MG (21) TBPK tablet; Take as directed  Dispense: 21 tablet; Refill: 0  4. Hemorrhoids, unspecified hemorrhoid type Comments: No firm hemorroid noted but does have some extra skin to rectal area which may be an improving hemorroid, will provide preparation h cream, eat more fiber -  hydrocortisone cream 1 %; Apply to affected area 2 times daily  Dispense: 30 g; Refill: 1  5. Generalized abdominal pain Comments: she does not want to go to Dr. Michail Sermon for f/u due to him wanting her to get a colonoscopy. I advised this is recommended at her age, will provide limited  - dicyclomine (BENTYL) 20 MG tablet; Take 1 tablet (20 mg total) by mouth every 6 (six) hours.  Dispense: 90 tablet; Refill: 1  6. Left ear pain Comments: She does have some TM bulging to left ear, encouraged to take flonase two times a day - fluticasone (FLONASE) 50 MCG/ACT nasal spray; Place 2 sprays into both nostrils daily.  Dispense: 16 g; Refill: 2  7. Elevated cholesterol Comments: Slightly elevated cholesterol in June 2022, will check lipid panel.  - Lipid panel - BMP8+eGFR  8. BMI 28.0-28.9,adult Comments: She is now having an increase in her weight of approximately 12 lbs in the last year. She has not been on Saxenda as well. Sample of Wegovy given in office  - Semaglutide-Weight Management (WEGOVY) 1 MG/0.5ML SOAJ; Inject 1 mg into the skin once a week.  Dispense: 2 mL; Refill: 0  9. History of COVID-19 Comments: She has had covid twice most recent in November.      Patient was given opportunity to ask questions. Patient verbalized understanding of the plan and was able to repeat key elements of the plan. All questions were answered to their satisfaction.  [name], FNP   I, [name], FNP, have reviewed all documentation for this visit. The documentation on 02/14/21 for the exam, diagnosis,  procedures, and orders are all accurate and complete.   IF YOU HAVE BEEN REFERRED TO A SPECIALIST, IT MAY TAKE 1-2 WEEKS TO SCHEDULE/PROCESS THE REFERRAL. IF YOU HAVE NOT HEARD FROM US/SPECIALIST IN TWO WEEKS, PLEASE GIVE [place] A CALL AT [phone] X [phone].   THE PATIENT IS ENCOURAGED TO PRACTICE SOCIAL DISTANCING DUE TO THE COVID-19 PANDEMIC.

## 2021-02-15 LAB — CBC
Hematocrit: 36.1 % (ref 34.0–46.6)
Hemoglobin: 12.6 g/dL (ref 11.1–15.9)
MCH: 31.2 pg (ref 26.6–33.0)
MCHC: 34.9 g/dL (ref 31.5–35.7)
MCV: 89 fL (ref 79–97)
Platelets: 286 10*3/uL (ref 150–450)
RBC: 4.04 x10E6/uL (ref 3.77–5.28)
RDW: 12.1 % (ref 11.7–15.4)
WBC: 4.8 10*3/uL (ref 3.4–10.8)

## 2021-02-15 LAB — LIPID PANEL
Chol/HDL Ratio: 2.9 ratio (ref 0.0–4.4)
Cholesterol, Total: 235 mg/dL — ABNORMAL HIGH (ref 100–199)
HDL: 82 mg/dL (ref >39)
LDL Chol Calc (NIH): 135 mg/dL — ABNORMAL HIGH (ref 0–99)
Triglycerides: 103 mg/dL (ref 0–149)
VLDL Cholesterol Cal: 18 mg/dL (ref 5–40)

## 2021-02-15 LAB — BMP8+EGFR
BUN/Creatinine Ratio: 10 (ref 9–23)
BUN: 8 mg/dL (ref 6–24)
CO2: 24 mmol/L (ref 20–29)
Calcium: 9.8 mg/dL (ref 8.7–10.2)
Chloride: 102 mmol/L (ref 96–106)
Creatinine, Ser: 0.82 mg/dL (ref 0.57–1.00)
Glucose: 80 mg/dL (ref 70–99)
Potassium: 3.7 mmol/L (ref 3.5–5.2)
Sodium: 140 mmol/L (ref 134–144)
eGFR: 89 mL/min/{1.73_m2} (ref >59)

## 2021-02-15 LAB — TSH: TSH: 1.68 u[IU]/mL (ref 0.450–4.500)

## 2021-02-21 ENCOUNTER — Encounter: Payer: Self-pay | Admitting: Nurse Practitioner

## 2021-02-22 ENCOUNTER — Other Ambulatory Visit: Payer: BC Managed Care – PPO

## 2021-02-23 ENCOUNTER — Telehealth: Payer: Self-pay

## 2021-02-23 NOTE — Telephone Encounter (Signed)
Error

## 2021-03-08 ENCOUNTER — Telehealth: Payer: Self-pay | Admitting: Neurology

## 2021-03-08 NOTE — Telephone Encounter (Signed)
PA completed on CMM/BCBS POD:[id number] Will await response

## 2021-03-09 NOTE — Telephone Encounter (Signed)
PA approved for the pt through [facility] [employer]. Approved 03/08/2021-03/08/2022

## 2021-03-15 ENCOUNTER — Other Ambulatory Visit: Payer: Self-pay

## 2021-03-15 ENCOUNTER — Encounter: Payer: Self-pay | Admitting: Internal Medicine

## 2021-03-15 ENCOUNTER — Ambulatory Visit: Payer: BC Managed Care – PPO | Admitting: Internal Medicine

## 2021-03-15 VITALS — BP 116/72 | HR 68 | Temp 97.9°F | Ht 63.5 in | Wt 164.4 lb

## 2021-03-15 DIAGNOSIS — Z6828 Body mass index (BMI) 28.0-28.9, adult: Secondary | ICD-10-CM

## 2021-03-15 DIAGNOSIS — G35D Multiple sclerosis, unspecified: Secondary | ICD-10-CM

## 2021-03-15 DIAGNOSIS — N951 Menopausal and female climacteric states: Secondary | ICD-10-CM

## 2021-03-15 DIAGNOSIS — G35 Multiple sclerosis: Secondary | ICD-10-CM

## 2021-03-15 DIAGNOSIS — Z2821 Immunization not carried out because of patient refusal: Secondary | ICD-10-CM

## 2021-03-15 NOTE — Progress Notes (Signed)
Kelly Alvarado,acting as a Education administrator for Kelly Greenland, MD.,have documented all relevant documentation on the behalf of Kelly Greenland, MD,as directed by  Kelly Greenland, MD while in the presence of Kelly Greenland, MD.  This visit occurred during the SARS-CoV-2 public health emergency.  Safety protocols were in place, including screening questions prior to the visit, additional usage of staff PPE, and extensive cleaning of exam room while observing appropriate contact time as indicated for disinfecting solutions.  Subjective:     Patient ID: Kelly Alvarado , female    DOB: August 25, 1975 , 46 y.o.   MRN: [id number]   Chief Complaint  Patient presents with   Weight Check    HPI  Patient presents today for a weight check. She has been followed by [name], DNP for at least 2 years.  She is currently taking Wegovy 1mg  weekly. She has not had any issues with the medication. Goal weight is 145-150 lbs. She walks 4 - 5 days weekly.     Past Medical History:  Diagnosis Date   Allergy    Anemia    HEAVY PERIODS   Headache(784.0)    MIGRAINES   Multiple sclerosis (HCC)    Neuromuscular disorder (HCC)    MS   Pulmonary embolism ([place]) 2013   lt-?bcp   Wears glasses      Family History  Problem Relation Age of Onset   Asthma Mother    Hypertension Father    Cancer Paternal Uncle    Cancer Paternal Grandfather      Current Outpatient Medications:    amphetamine-dextroamphetamine (ADDERALL XR) 25 MG 24 hr capsule, Take 1 capsule by mouth daily., Disp: 30 capsule, Rfl: 0   betamethasone dipropionate 0.05 % cream, Apply topically 2 (two) times daily., Disp: 30 g, Rfl: 0   dicyclomine (BENTYL) 20 MG tablet, Take 1 tablet (20 mg total) by mouth every 6 (six) hours., Disp: 90 tablet, Rfl: 1   EMGALITY 120 MG/ML SOAJ, INJECT 1 PEN INTO THE SKIN EVERY 28 DAYS., Disp: 3 mL, Rfl: 4   fluticasone (FLONASE) 50 MCG/ACT nasal spray, Place 2 sprays into both nostrils daily., Disp: 16 g,  Rfl: 2   gabapentin (NEURONTIN) 300 MG capsule, TAKE 1 CAPSULE IN AM, AND 1 CAP IN EVENING, AND 2 AT BEDTIME, Disp: 360 capsule, Rfl: 1   hydrocortisone cream 1 %, Apply to affected area 2 times daily, Disp: 30 g, Rfl: 1   levocetirizine (XYZAL) 5 MG tablet, TAKE 1 TABLET BY MOUTH EVERY DAY EVERY EVENING, Disp: 90 tablet, Rfl: 1   ocrelizumab (OCREVUS) 300 MG/10ML injection, Inject 600 mg into the vein every 6 (six) months., Disp: , Rfl:    rizatriptan (MAXALT-MLT) 10 MG disintegrating tablet, TAKE 1 TABLET BY MOUTH AS NEEDED FOR MIGRAINE. MAY REPEAT IN 2 HOURS IF NEEDED, Disp: 27 tablet, Rfl: 3   Semaglutide-Weight Management (WEGOVY) 1 MG/0.5ML SOAJ, Inject 1 mg into the skin once a week., Disp: 2 mL, Rfl: 0   tamsulosin (FLOMAX) 0.4 MG CAPS capsule, Take 1 capsule (0.4 mg total) by mouth daily., Disp: 90 capsule, Rfl: 3   Vitamin D, Ergocalciferol, (DRISDOL) 1.25 MG (50000 UNIT) CAPS capsule, TAKE 1 CAPSULE (50,000 UNITS TOTAL) BY MOUTH 2 (TWO) TIMES A WEEK. (Patient not taking: Reported on 03/15/2021), Disp: 26 capsule, Rfl: 1  Current Facility-Administered Medications:    0.9 %  sodium chloride infusion, , Intravenous, PRN, [name], FNP   Allergies  Allergen Reactions  Amoxicillin Hives    Has patient had a PCN reaction causing immediate rash, facial/tongue/throat swelling, SOB or lightheadedness with hypotension: Yes Has patient had a PCN reaction causing severe rash involving mucus membranes or skin necrosis: Yes Has patient had a PCN reaction that required hospitalization No Has patient had a PCN reaction occurring within the last 10 years: No If all of the above answers are "NO", then may proceed with Cephalosporin use.    Milk-Related Compounds Diarrhea and Nausea And Vomiting    Stomach cramps   Yellow Dyes (Non-Tartrazine) Hives     Review of Systems  Constitutional: Negative.   Respiratory: Negative.    Cardiovascular: Negative.   Gastrointestinal: Negative.    Musculoskeletal:  Positive for arthralgias.  Neurological: Negative.   Psychiatric/Behavioral:  Positive for sleep disturbance.     Today's Vitals   03/15/21 1624  BP: 116/72  Pulse: 68  Temp: 97.9 F (36.6 C)  Weight: 164 lb 6.4 oz (74.6 kg)  Height: 5' 3.5" (1.613 m)  PainSc: 0-No pain   Body mass index is 28.67 kg/m.  Wt Readings from Last 3 Encounters:  03/15/21 164 lb 6.4 oz (74.6 kg)  02/14/21 165 lb 6.4 oz (75 kg)  11/14/20 163 lb (73.9 kg)     Objective:  Physical Exam Vitals and nursing note reviewed.  Constitutional:      Appearance: Normal appearance.  HENT:     Head: Normocephalic and atraumatic.     Nose:     Comments: Masked     Mouth/Throat:     Comments: Masked  Eyes:     Extraocular Movements: Extraocular movements intact.  Cardiovascular:     Rate and Rhythm: Normal rate and regular rhythm.     Heart sounds: Normal heart sounds.  Pulmonary:     Effort: Pulmonary effort is normal.     Breath sounds: Normal breath sounds.  Musculoskeletal:     Cervical back: Normal range of motion.  Skin:    General: Skin is warm.  Neurological:     General: No focal deficit present.     Mental Status: She is alert.  Psychiatric:        Mood and Affect: Mood normal.        Behavior: Behavior normal.     Assessment And Plan:     1. BMI 28.0-28.9,adult Comments: She would like to increase Wegovy to 1.7mg  weekly, rx sent to pharmacy. She will f/u in 8-10 wks for re-evaluation. She is advised to also incorporate at least 2 days of strength training weekly.   2. Multiple sclerosis ([place]) Comments: Chronic, as per Neuro. She is currently on Ocrevus. She is encouraged to follow anti-inflammatory diet.   3. Perimenopausal Comments: Pt advised her joint pains, sleep disturbance is likely due to perimenopause. May benefit from melatonin. Advised to drink dandelion root tea nightly.  4. Influenza vaccination declined   Patient was given opportunity to ask  questions. Patient verbalized understanding of the plan and was able to repeat key elements of the plan. All questions were answered to their satisfaction.  Kelly Greenland, MD   I, Kelly Greenland, MD, have reviewed all documentation for this visit. The documentation on 03/16/21 for the exam, diagnosis, procedures, and orders are all accurate and complete.   IF YOU HAVE BEEN REFERRED TO A SPECIALIST, IT MAY TAKE 1-2 WEEKS TO SCHEDULE/PROCESS THE REFERRAL. IF YOU HAVE NOT HEARD FROM US/SPECIALIST IN TWO WEEKS, PLEASE GIVE [place] A CALL AT [phone] X  [phone].   THE PATIENT IS ENCOURAGED TO PRACTICE SOCIAL DISTANCING DUE TO THE COVID-19 PANDEMIC.

## 2021-03-15 NOTE — Patient Instructions (Addendum)
Dandelion root tea Remifemin - nighttime  Exercising to Lose Weight Getting regular exercise is important for everyone. It is especially important if you are overweight. Being overweight increases your risk of heart disease, stroke, diabetes, high blood pressure, and several types of cancer. Exercising, and reducing the calories you consume, can help you lose weight and improve fitness and health. Exercise can be moderate or vigorous intensity. To lose weight, most people need to do a certain amount of moderate or vigorous-intensity exercise each week. How can exercise affect me? You lose weight when you exercise enough to burn more calories than you eat. Exercise also reduces body fat and builds muscle. The more muscle you have, the more calories you burn. Exercise also: Improves mood. Reduces stress and tension. Improves your overall fitness, flexibility, and endurance. Increases bone strength. Moderate-intensity exercise Moderate-intensity exercise is any activity that gets you moving enough to burn at least three times more energy (calories) than if you were sitting. Examples of moderate exercise include: Walking a mile in 15 minutes. Doing light yard work. Biking at an easy pace. Most people should get at least 150 minutes of moderate-intensity exercise a week to maintain their body weight. Vigorous-intensity exercise Vigorous-intensity exercise is any activity that gets you moving enough to burn at least six times more calories than if you were sitting. When you exercise at this intensity, you should be working hard enough that you are not able to carry on a conversation. Examples of vigorous exercise include: Running. Playing a team sport, such as football, basketball, and soccer. Jumping rope. Most people should get at least 75 minutes a week of vigorous exercise to maintain their body weight. What actions can I take to lose weight? The amount of exercise you need to lose weight  depends on: Your age. The type of exercise. Any health conditions you have. Your overall physical ability. Talk to your health care provider about how much exercise you need and what types of activities are safe for you. Nutrition  Make changes to your diet as told by your health care provider or diet and nutrition specialist (dietitian). This may include: Eating fewer calories. Eating more protein. Eating less unhealthy fats. Eating a diet that includes fresh fruits and vegetables, whole grains, low-fat dairy products, and lean protein. Avoiding foods with added fat, salt, and sugar. Drink plenty of water while you exercise to prevent dehydration or heat stroke. Activity Choose an activity that you enjoy and set realistic goals. Your health care provider can help you make an exercise plan that works for you. Exercise at a moderate or vigorous intensity most days of the week. The intensity of exercise may vary from person to person. You can tell how intense a workout is for you by paying attention to your breathing and heartbeat. Most people will notice their breathing and heartbeat get faster with more intense exercise. Do resistance training twice each week, such as: Push-ups. Sit-ups. Lifting weights. Using resistance bands. Getting short amounts of exercise can be just as helpful as long, structured periods of exercise. If you have trouble finding time to exercise, try doing these things as part of your daily routine: Get up, stretch, and walk around every 30 minutes throughout the day. Go for a walk during your lunch break. Park your car farther away from your destination. If you take public transportation, get off one stop early and walk the rest of the way. Make phone calls while standing up and walking around. Take the  stairs instead of elevators or escalators. Wear comfortable clothes and shoes with good support. Do not exercise so much that you hurt yourself, feel dizzy, or  get very short of breath. Where to find more information [place].S. Department of Health and Human Services: [employer]at Centers for Disease Control and Prevention: http://www.wolf.info/ Contact a health care provider: Before starting a new exercise program. If you have questions or concerns about your weight. If you have a medical problem that keeps you from exercising. Get help right away if: You have any of the following while exercising: Injury. Dizziness. Difficulty breathing or shortness of breath that does not go away when you stop exercising. Chest pain. Rapid heartbeat. These symptoms may represent a serious problem that is an emergency. Do not wait to see if the symptoms will go away. Get medical help right away. Call your local emergency services (911 in the [place] Do not drive yourself to the hospital. Summary Getting regular exercise is especially important if you are overweight. Being overweight increases your risk of heart disease, stroke, diabetes, high blood pressure, and several types of cancer. Losing weight happens when you burn more calories than you eat. Reducing the amount of calories you eat, and getting regular moderate or vigorous exercise each week, helps you lose weight. This information is not intended to replace advice given to you by your health care provider. Make sure you discuss any questions you have with your health care provider. Document Revised: 03/20/2020 Document Reviewed: 03/20/2020 Elsevier Patient Education  2022 [employer].

## 2021-03-30 ENCOUNTER — Other Ambulatory Visit: Payer: Self-pay

## 2021-03-31 ENCOUNTER — Other Ambulatory Visit: Payer: Self-pay | Admitting: Nurse Practitioner

## 2021-03-31 DIAGNOSIS — R1084 Generalized abdominal pain: Secondary | ICD-10-CM

## 2021-04-03 ENCOUNTER — Other Ambulatory Visit: Payer: Self-pay | Admitting: Nurse Practitioner

## 2021-04-03 DIAGNOSIS — Z6828 Body mass index (BMI) 28.0-28.9, adult: Secondary | ICD-10-CM

## 2021-04-03 MED ORDER — WEGOVY 1 MG/0.5ML ~~LOC~~ SOAJ: 1.0000 mg | SUBCUTANEOUS | 0 refills | Status: DC

## 2021-04-26 ENCOUNTER — Other Ambulatory Visit: Payer: Self-pay | Admitting: Neurology

## 2021-04-26 ENCOUNTER — Encounter: Payer: Self-pay | Admitting: Neurology

## 2021-04-26 MED ORDER — EMGALITY 120 MG/ML ~~LOC~~ SOAJ: 1.0000 mL | SUBCUTANEOUS | 0 refills | Status: DC

## 2021-04-27 ENCOUNTER — Encounter: Payer: BC Managed Care – PPO | Admitting: Internal Medicine

## 2021-04-27 ENCOUNTER — Other Ambulatory Visit: Payer: Self-pay

## 2021-04-27 MED ORDER — LEVOCETIRIZINE DIHYDROCHLORIDE 5 MG PO TABS: ORAL_TABLET | ORAL | 1 refills | Status: DC

## 2021-04-27 MED ORDER — WEGOVY 1.7 MG/0.75ML ~~LOC~~ SOAJ: 1.7000 mg | SUBCUTANEOUS | 0 refills | Status: DC

## 2021-05-04 ENCOUNTER — Ambulatory Visit (INDEPENDENT_AMBULATORY_CARE_PROVIDER_SITE_OTHER): Payer: BC Managed Care – PPO | Admitting: Nurse Practitioner

## 2021-05-04 ENCOUNTER — Encounter: Payer: Self-pay | Admitting: Nurse Practitioner

## 2021-05-04 VITALS — BP 122/70 | HR 74 | Temp 97.8°F | Ht 64.0 in | Wt 152.2 lb

## 2021-05-04 DIAGNOSIS — Z1211 Encounter for screening for malignant neoplasm of colon: Secondary | ICD-10-CM

## 2021-05-04 DIAGNOSIS — Z6828 Body mass index (BMI) 28.0-28.9, adult: Secondary | ICD-10-CM

## 2021-05-04 DIAGNOSIS — E559 Vitamin D deficiency, unspecified: Secondary | ICD-10-CM

## 2021-05-04 DIAGNOSIS — Z0000 Encounter for general adult medical examination without abnormal findings: Secondary | ICD-10-CM | POA: Diagnosis not present

## 2021-05-04 DIAGNOSIS — R5383 Other fatigue: Secondary | ICD-10-CM | POA: Diagnosis not present

## 2021-05-04 DIAGNOSIS — Z79899 Other long term (current) drug therapy: Secondary | ICD-10-CM

## 2021-05-04 DIAGNOSIS — G35 Multiple sclerosis: Secondary | ICD-10-CM

## 2021-05-04 DIAGNOSIS — E7800 Pure hypercholesterolemia, unspecified: Secondary | ICD-10-CM | POA: Diagnosis not present

## 2021-05-04 DIAGNOSIS — R52 Pain, unspecified: Secondary | ICD-10-CM

## 2021-05-04 MED ORDER — PREDNISONE 10 MG (21) PO TBPK: ORAL_TABLET | ORAL | 0 refills | Status: DC

## 2021-05-04 MED ORDER — NITROFURANTOIN MONOHYD MACRO 100 MG PO CAPS: 100.0000 mg | ORAL_CAPSULE | Freq: Two times a day (BID) | ORAL | 0 refills | Status: AC

## 2021-05-04 MED ORDER — WEGOVY 2.4 MG/0.75ML ~~LOC~~ SOAJ: 2.4000 mg | SUBCUTANEOUS | 1 refills | Status: DC

## 2021-05-04 MED ORDER — MELOXICAM 15 MG PO TBDP: 1.0000 | ORAL_TABLET | Freq: Every day | ORAL | 1 refills | Status: DC

## 2021-05-04 MED ORDER — VITAMIN D (ERGOCALCIFEROL) 1.25 MG (50000 UNIT) PO CAPS: 50000.0000 [IU] | ORAL_CAPSULE | ORAL | 1 refills | Status: DC

## 2021-05-04 NOTE — Patient Instructions (Signed)

## 2021-05-04 NOTE — Progress Notes (Signed)
?Industrial/product designer as a Education administrator for [employer], FNP.,have documented all relevant documentation on the behalf of [name], FNP,as directed by  [name], FNP while in the presence of [name], [place]. ? ?This visit occurred during the SARS-CoV-2 public health emergency.  Safety protocols were in place, including screening questions prior to the visit, additional usage of staff PPE, and extensive cleaning of exam room while observing appropriate contact time as indicated for disinfecting solutions. ? ?Subjective:  ?  ? Patient ID: Kelly Alvarado , female    DOB: [birth year] , 46 y.o.   MRN: [id number] ? ? ?Chief Complaint  ?Patient presents with  ? Annual Exam  ? ? ?HPI ? ?Patient is here for physical exam. She is followed by Dr Mancel Bale at [facility] for her GYN care. She sees a neurologist every 6 months for her MS. She feels like she is having an MS exaceration having brain fog, hand pain ? ?Has not worked since Tuesday. She does not see her Neurologist until April 11th.  ? ?Wt Readings from Last 3 Encounters: ?05/04/21 : 152 lb 3.2 oz (69 kg) ?03/15/21 : 164 lb 6.4 oz (74.6 kg) ?02/14/21 : 165 lb 6.4 oz (75 kg) ? ? ? ?Leg Pain  ?The incident occurred more than 1 week ago. There was no injury mechanism. The pain is present in the left leg and left thigh. The quality of the pain is described as aching. The pain is mild. Pertinent negatives include no numbness or tingling. The symptoms are aggravated by movement (unable to sleep on her left side at night due to pain).   ? ?Past Medical History:  ?Diagnosis Date  ? Allergy   ? Anemia   ? HEAVY PERIODS  ? Headache(784.0)   ? MIGRAINES  ? Multiple sclerosis ([place])   ? Neuromuscular disorder ([place])   ? MS  ? Pulmonary embolism ([place]) 2013  ? lt-?bcp  ? Wears glasses   ?  ? ?Family History  ?Problem Relation Age of Onset  ? Asthma Mother   ? Hypertension Father   ? Cancer Paternal Uncle   ? Cancer Paternal Grandfather   ? ? ? ?Current Outpatient Medications:  ?   amphetamine-dextroamphetamine (ADDERALL XR) 25 MG 24 hr capsule, Take 1 capsule by mouth daily., Disp: 30 capsule, Rfl: 0 ?  betamethasone dipropionate 0.05 % cream, Apply topically 2 (two) times daily., Disp: 30 g, Rfl: 0 ?  dicyclomine (BENTYL) 20 MG tablet, TAKE 1 TABLET BY MOUTH EVERY 6 HOURS., Disp: 90 tablet, Rfl: 1 ?  fluticasone (FLONASE) 50 MCG/ACT nasal spray, Place 2 sprays into both nostrils daily., Disp: 16 g, Rfl: 2 ?  gabapentin (NEURONTIN) 300 MG capsule, TAKE 1 CAPSULE IN AM, AND 1 CAP IN EVENING, AND 2 AT BEDTIME, Disp: 360 capsule, Rfl: 1 ?  Galcanezumab-gnlm (EMGALITY) 120 MG/ML SOAJ, Inject 1 mL into the skin every 30 (thirty) days., Disp: 1 mL, Rfl: 0 ?  hydrocortisone cream 1 %, Apply to affected area 2 times daily, Disp: 30 g, Rfl: 1 ?  levocetirizine (XYZAL) 5 MG tablet, TAKE 1 TABLET BY MOUTH EVERY DAY EVERY EVENING, Disp: 90 tablet, Rfl: 1 ?  Meloxicam 15 MG TBDP, Take 1 tablet by mouth daily., Disp: 30 tablet, Rfl: 1 ?  nitrofurantoin, macrocrystal-monohydrate, (MACROBID) 100 MG capsule, Take 1 capsule (100 mg total) by mouth 2 (two) times daily for 5 days., Disp: 10 capsule, Rfl: 0 ?  ocrelizumab (OCREVUS) 300 MG/10ML injection, Inject 600 mg into the  vein every 6 (six) months., Disp: , Rfl:  ?  predniSONE (STERAPRED UNI-PAK 21 TAB) 10 MG (21) TBPK tablet, Take as directed, Disp: 21 tablet, Rfl: 0 ?  rizatriptan (MAXALT-MLT) 10 MG disintegrating tablet, TAKE 1 TABLET BY MOUTH AS NEEDED FOR MIGRAINE. MAY REPEAT IN 2 HOURS IF NEEDED, Disp: 27 tablet, Rfl: 3 ?  Semaglutide-Weight Management (WEGOVY) 2.4 MG/0.75ML SOAJ, Inject 2.4 mg into the skin once a week., Disp: 3 mL, Rfl: 1 ?  tamsulosin (FLOMAX) 0.4 MG CAPS capsule, Take 1 capsule (0.4 mg total) by mouth daily., Disp: 90 capsule, Rfl: 3 ?  Vitamin D, Ergocalciferol, (DRISDOL) 1.25 MG (50000 UNIT) CAPS capsule, Take 1 capsule (50,000 Units total) by mouth 2 (two) times a week., Disp: 24 capsule, Rfl: 1 ? ?Current Facility-Administered  Medications:  ?  0.9 %  sodium chloride infusion, , Intravenous, PRN, [name], FNP  ? ?Allergies  ?Allergen Reactions  ? Amoxicillin Hives  ?  Has patient had a PCN reaction causing immediate rash, facial/tongue/throat swelling, SOB or lightheadedness with hypotension: Yes ?Has patient had a PCN reaction causing severe rash involving mucus membranes or skin necrosis: Yes ?Has patient had a PCN reaction that required hospitalization No ?Has patient had a PCN reaction occurring within the last 10 years: No ?If all of the above answers are "NO", then may proceed with Cephalosporin use. ?  ? Milk-Related Compounds Diarrhea and Nausea And Vomiting  ?  Stomach cramps  ? Yellow Dyes (Non-Tartrazine) Hives  ?  ? ? ?The patient states she is status post hysterectomy.   Patient's last menstrual period was 12/11/2015 (approximate).. Negative for Dysmenorrhea and Negative for Menorrhagia. Negative for: breast discharge, breast lump(s), breast pain and breast self exam. Associated symptoms include abnormal vaginal bleeding. Pertinent negatives include abnormal bleeding (hematology), anxiety, decreased libido, depression, difficulty falling sleep, dyspareunia, history of infertility, nocturia, sexual dysfunction, sleep disturbances, urinary incontinence, urinary urgency, vaginal discharge and vaginal itching. Diet regular.The patient states her exercise level is minimal -  ? . The patient's tobacco use is:  ?Social History  ? ?Tobacco Use  ?Smoking Status Never  ?Smokeless Tobacco Never  ? ?She has been exposed to passive smoke. The patient's alcohol use is:  ?Social History  ? ?Substance and Sexual Activity  ?Alcohol Use Not Currently  ? ?Additional information: Last pap 05/23/2017, next one scheduled for none schedule.   ? ?Review of Systems  ?Constitutional: Negative.   ?HENT: Negative.    ?Eyes: Negative.   ?Respiratory: Negative.    ?Cardiovascular: Negative.  Negative for chest pain, palpitations and leg swelling.   ?Gastrointestinal: Negative.   ?Endocrine: Negative.   ?Genitourinary: Negative.   ?Musculoskeletal:  Positive for arthralgias.  ?Skin: Negative.   ?Allergic/Immunologic: Negative.   ?Neurological: Negative.  Negative for tingling and numbness.  ?Hematological: Negative.   ?Psychiatric/Behavioral: Negative.    ?     Reporting brain fog and difficulty with finding words   ? ?Today's Vitals  ? 05/04/21 0831  ?BP: 122/70  ?Pulse: 74  ?Temp: 97.8 ?F (36.6 ?C)  ?TempSrc: Oral  ?Weight: 152 lb 3.2 oz (69 kg)  ?Height: '5\' 4"'$  (1.626 m)  ? ?Body mass index is 26.13 kg/m?.  ?Wt Readings from Last 3 Encounters:  ?05/04/21 152 lb 3.2 oz (69 kg)  ?03/15/21 164 lb 6.4 oz (74.6 kg)  ?02/14/21 165 lb 6.4 oz (75 kg)  ? ? ?Objective:  ?Physical Exam ?Vitals reviewed.  ?Constitutional:   ?   General: She is not  in acute distress. ?   Appearance: Normal appearance. She is well-developed. She is obese.  ?HENT:  ?   Head: Normocephalic and atraumatic.  ?   Right Ear: Hearing, tympanic membrane, ear canal and external ear normal. There is no impacted cerumen.  ?   Left Ear: Hearing, tympanic membrane, ear canal and external ear normal. There is no impacted cerumen.  ?   Nose:  ?   Comments: Deferred - masked ?   Mouth/Throat:  ?   Comments: Deferred - masked ?Eyes:  ?   General: Lids are normal.  ?   Extraocular Movements: Extraocular movements intact.  ?   Conjunctiva/sclera: Conjunctivae normal.  ?   Pupils: Pupils are equal, round, and reactive to light.  ?   Funduscopic exam: ?   Right eye: No papilledema.     ?   Left eye: No papilledema.  ?Neck:  ?   Thyroid: No thyroid mass.  ?   Vascular: No carotid bruit.  ?Cardiovascular:  ?   Rate and Rhythm: Normal rate and regular rhythm.  ?   Pulses: Normal pulses.  ?   Heart sounds: Normal heart sounds. No murmur heard. ?Pulmonary:  ?   Effort: Pulmonary effort is normal. No respiratory distress.  ?   Breath sounds: Normal breath sounds. No wheezing.  ?Chest:  ?   Chest wall: No mass.   ?Breasts: ?   Tanner Score is 5.  ?   Right: Normal. No mass or tenderness.  ?   Left: Normal. No mass or tenderness.  ?Abdominal:  ?   General: Abdomen is flat. Bowel sounds are normal. There is no distension.

## 2021-05-05 LAB — CBC
Hematocrit: 35.8 % (ref 34.0–46.6)
Hemoglobin: 12.5 g/dL (ref 11.1–15.9)
MCH: 31.6 pg (ref 26.6–33.0)
MCHC: 34.9 g/dL (ref 31.5–35.7)
MCV: 90 fL (ref 79–97)
Platelets: 306 10*3/uL (ref 150–450)
RBC: 3.96 x10E6/uL (ref 3.77–5.28)
RDW: 12.3 % (ref 11.7–15.4)
WBC: 3.5 10*3/uL (ref 3.4–10.8)

## 2021-05-05 LAB — CMP14+EGFR
ALT: 6 IU/L (ref 0–32)
AST: 16 IU/L (ref 0–40)
Albumin/Globulin Ratio: 1.9 (ref 1.2–2.2)
Albumin: 4.7 g/dL (ref 3.8–4.8)
Alkaline Phosphatase: 76 IU/L (ref 44–121)
BUN/Creatinine Ratio: 8 — ABNORMAL LOW (ref 9–23)
BUN: 7 mg/dL (ref 6–24)
Bilirubin Total: 0.6 mg/dL (ref 0.0–1.2)
CO2: 22 mmol/L (ref 20–29)
Calcium: 9.5 mg/dL (ref 8.7–10.2)
Chloride: 101 mmol/L (ref 96–106)
Creatinine, Ser: 0.83 mg/dL (ref 0.57–1.00)
Globulin, Total: 2.5 g/dL (ref 1.5–4.5)
Glucose: 67 mg/dL — ABNORMAL LOW (ref 70–99)
Potassium: 3.7 mmol/L (ref 3.5–5.2)
Sodium: 140 mmol/L (ref 134–144)
Total Protein: 7.2 g/dL (ref 6.0–8.5)
eGFR: 87 mL/min/{1.73_m2} (ref >59)

## 2021-05-05 LAB — LIPID PANEL
Chol/HDL Ratio: 3.3 ratio (ref 0.0–4.4)
Cholesterol, Total: 216 mg/dL — ABNORMAL HIGH (ref 100–199)
HDL: 66 mg/dL (ref >39)
LDL Chol Calc (NIH): 130 mg/dL — ABNORMAL HIGH (ref 0–99)
Triglycerides: 116 mg/dL (ref 0–149)
VLDL Cholesterol Cal: 20 mg/dL (ref 5–40)

## 2021-05-05 LAB — VITAMIN D 25 HYDROXY (VIT D DEFICIENCY, FRACTURES): Vit D, 25-Hydroxy: 20.8 ng/mL — ABNORMAL LOW (ref 30.0–100.0)

## 2021-05-05 LAB — TSH: TSH: 2.69 u[IU]/mL (ref 0.450–4.500)

## 2021-05-05 LAB — VITAMIN B12: Vitamin B-12: 553 pg/mL (ref 232–1245)

## 2021-05-08 ENCOUNTER — Other Ambulatory Visit (HOSPITAL_BASED_OUTPATIENT_CLINIC_OR_DEPARTMENT_OTHER): Payer: Self-pay

## 2021-05-13 ENCOUNTER — Other Ambulatory Visit: Payer: Self-pay | Admitting: Nurse Practitioner

## 2021-05-13 DIAGNOSIS — H9202 Otalgia, left ear: Secondary | ICD-10-CM

## 2021-05-16 ENCOUNTER — Encounter: Payer: Self-pay | Admitting: Neurology

## 2021-05-16 ENCOUNTER — Ambulatory Visit: Payer: BC Managed Care – PPO | Admitting: Neurology

## 2021-05-16 VITALS — BP 95/64 | HR 81 | Ht 64.0 in | Wt 150.5 lb

## 2021-05-16 DIAGNOSIS — R208 Other disturbances of skin sensation: Secondary | ICD-10-CM

## 2021-05-16 DIAGNOSIS — G43009 Migraine without aura, not intractable, without status migrainosus: Secondary | ICD-10-CM

## 2021-05-16 DIAGNOSIS — G35 Multiple sclerosis: Secondary | ICD-10-CM

## 2021-05-16 DIAGNOSIS — Z79899 Other long term (current) drug therapy: Secondary | ICD-10-CM | POA: Diagnosis not present

## 2021-05-16 DIAGNOSIS — G4700 Insomnia, unspecified: Secondary | ICD-10-CM

## 2021-05-16 DIAGNOSIS — G35D Multiple sclerosis, unspecified: Secondary | ICD-10-CM

## 2021-05-16 MED ORDER — EMGALITY 120 MG/ML ~~LOC~~ SOAJ: 1.0000 mL | SUBCUTANEOUS | 0 refills | Status: DC

## 2021-05-16 MED ORDER — AMPHETAMINE-DEXTROAMPHET ER 25 MG PO CP24: 25.0000 mg | ORAL_CAPSULE | Freq: Every day | ORAL | 0 refills | Status: DC

## 2021-05-16 MED ORDER — DOXEPIN HCL 10 MG PO CAPS: 10.0000 mg | ORAL_CAPSULE | Freq: Every day | ORAL | 3 refills | Status: DC

## 2021-05-16 NOTE — Progress Notes (Signed)
? ?[facility] ? ?PATIENT: Kelly Alvarado ?DOB: 1975-02-11 ? ?REFERRING DOCTOR OR PCP:  [name]  ?SOURCE: patient and records ? ?_________________________________ ? ? ?HISTORICAL ? ?CHIEF COMPLAINT:  ?Chief Complaint  ?Patient presents with  ? Follow-up  ?  Rm 1, alone. Here for 6 month migraine and MS f/u, on Ocrevus and tolerating well. Last infusion date: 12/22/2020 and Next infusion date:06/22/2021. Pt reports being in pn all the time and moving slower.  ?Migraines are controlled and are 3-4 month.   ? ? ? ?HISTORY OF PRESENT ILLNESS:  ?Kelly Alvarado is a 46 year old woman with relapsing remitting MS diagnosed in 2010 and h/o a seizure in 2017. ? ?History of Present Illness: ?Update 05/16/2021: ?Her next Ocrevus infusion will be 06/22/2021     She denies any exacerbations.   However, she is feeling worse in general  ? ?She is feeling lethargic and in pain all over, arms > legs  It started more on her left.   She denies depression.   She feels she is only getting 3 hours of sleep.  She does take gabapentin 300-300-600.    She is perimemopausal.     ? ?She is noting squiggly lines on the right and acuity is worse.   She has seen ophthalmology and was otld she needs to see a retina specialist.  Does not have an appt.  Changes were gradual and painless.   ? ?Gait is doing ok but she feels it is a little slower due to pain.   She can go up and down stairs without the bannister.   She can walk > 3 miles.   No falls.   No clumsiness.   She has tingling in her arms.  Gabapentin helps the tingling.    She has mild left sided weakness.    ? ?She has had frequent UTIs and feels se does not completely empty.  Tamsulosin was added at the last visit and helps her some. ? ?She has had fatigue and reduced focus/attention.   Adderall has helped.   She had less fatigue when she was on Tysabri.   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   She is on Ocrevus now and feels  worse the last month of each cycle.   She is currently in the last month. ? ?No LOC spells or seizures recently.  Only seizure was in 2017 a few days after hysterectomy.   ? ?Chronic migraines are doing much better on Emgality.   This has worked better than any other medicationShe has breakthrough once or twice a month and then several the last week of each cycle - compared to 25+ days a month before [employer].   She takes Tylenol and/or Maxalt for breakthrough headaches   In past tried topiramate, zonisamide, nortriptyline and NSAIDs without much benefit.   ? 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 ?She had Covid-19 early 2022 and received Paxlovid with benefit.     She had headache, sore throat, low grade fever .   She also had Covid-19 again August but it was mild ? ?  ?MS History:    ?She was diagnosed with multiple sclerosis in 2010 after presenting with a left foot drop. MRI of the brain was consistent with multiple sclerosis.   She was  started on Rebif. The MS was well controlled but she felt poorly on that medication. She  transferred to [facility] and began to see me in 2012. She was placed on Tysabri for about a year and felt much better while on it. Her MS did well on Tysabri as well.  Unfortunately, she was JCV positive and after about a year she stopped Tysabri and started Gilenya. She has done fairly well on Gilenya with no definite exacerbations.  She switched to [place] in August 2020 after MRI showed new lesions.   ? ?IMAGING ?MRI brain 07/01/2018 shows multiple T2/flair hyperintense foci in the spinal cord, left middle cerebellar peduncle and in the periventricular, juxtacortical and deep white matter of the hemispheres.  These are consistent with chronic demyelinating plaque associated with multiple sclerosis.  None of the foci enhances or appears to be acute.  However, the 2 foci in the spinal cord and several of the foci in the hemispheres were not  present on the 2012 MRI.   There is a normal enhancement pattern and no acute findings. ? ?MRI cervical spine 07/01/2018 shows T2 hyperintense foci within the spinal cord adjacent to C1-C2, C3-C4, C5 and T3.  These are consistent with chronic demyelinating plaque associated with multiple sclerosis.  None of the foci enhanced on the current study.  However, none of the 4 foci were noted on the MRI from 2010 ?  ? ? ?REVIEW OF SYSTEMS: ?Constitutional: No fevers, chills, sweats, or change in appetite.  She notes fatigue and insomnia. ?Eyes: No double vision, eye pain.  She has right visula loss over last year.   ?Ear, nose and throat: No hearing loss, ear pain, nasal congestion, sore throat ?Cardiovascular: No chest pain, palpitations ?Respiratory:  No shortness of breath at rest or with exertion.   No wheezes ?GastrointestinaI: No nausea, vomiting, diarrhea, abdominal pain, fecal incontinence ?Genitourinary:  No dysuria, urinary retention or frequency.  No nocturia. ?Musculoskeletal:  No neck pain, back .  Notes myalgias at times ?Integumentary: No rash, pruritus, skin lesions ?Neurological: as above ?Psychiatric: No depression at this time.  No anxiety ?Endocrine: No palpitations, diaphoresis, change in appetite, change in weigh or increased thirst ?Hematologic/Lymphatic:  No anemia, purpura, petechiae. ?Allergic/Immunologic: No itchy/runny eyes, nasal congestion, recent allergic reactions, rashes.  She has some food allergies. ? ?ALLERGIES: ?Allergies  ?Allergen Reactions  ? Amoxicillin Hives  ?  Has patient had a PCN reaction causing immediate rash, facial/tongue/throat swelling, SOB or lightheadedness with hypotension: Yes ?Has patient had a PCN reaction causing severe rash involving mucus membranes or skin necrosis: Yes ?Has patient had a PCN reaction that required hospitalization No ?Has patient had a PCN reaction occurring within the last 10 years: No ?If all of the above answers are "NO", then may proceed with  Cephalosporin use. ?  ? Milk-Related Compounds Diarrhea and Nausea And Vomiting  ?  Stomach cramps  ? Yellow Dyes (Non-Tartrazine) Hives  ? ? ?HOME MEDICATIONS: ? ?Current Outpatient Medications:  ?  betamethasone dipropionate 0.05 % cream, Apply topically 2 (two) times daily., Disp: 30 g, Rfl: 0 ?  dicyclomine (BENTYL) 20 MG tablet, TAKE 1 TABLET BY MOUTH EVERY 6 HOURS., Disp: 90 tablet, Rfl: 1 ?  doxepin (SINEQUAN) 10 MG capsule, Take 1 capsule (10 mg total) by mouth at bedtime., Disp: 90 capsule, Rfl: 3 ?  fluticasone (FLONASE) 50 MCG/ACT nasal spray, SPRAY 2 SPRAYS INTO EACH NOSTRIL EVERY DAY, Disp: 48 mL, Rfl: 3 ?  gabapentin (NEURONTIN) 300 MG capsule, TAKE 1 CAPSULE IN AM, AND 1 CAP IN EVENING, AND 2 AT BEDTIME, Disp: 360  capsule, Rfl: 1 ?  hydrocortisone cream 1 %, Apply to affected area 2 times daily, Disp: 30 g, Rfl: 1 ?  levocetirizine (XYZAL) 5 MG tablet, TAKE 1 TABLET BY MOUTH EVERY DAY EVERY EVENING, Disp: 90 tablet, Rfl: 1 ?  Meloxicam 15 MG TBDP, Take 1 tablet by mouth daily., Disp: 30 tablet, Rfl: 1 ?  ocrelizumab (OCREVUS) 300 MG/10ML injection, Inject 600 mg into the vein every 6 (six) months., Disp: , Rfl:  ?  predniSONE (STERAPRED UNI-PAK 21 TAB) 10 MG (21) TBPK tablet, Take as directed, Disp: 21 tablet, Rfl: 0 ?  rizatriptan (MAXALT-MLT) 10 MG disintegrating tablet, TAKE 1 TABLET BY MOUTH AS NEEDED FOR MIGRAINE. MAY REPEAT IN 2 HOURS IF NEEDED, Disp: 27 tablet, Rfl: 3 ?  Semaglutide-Weight Management (WEGOVY) 2.4 MG/0.75ML SOAJ, Inject 2.4 mg into the skin once a week., Disp: 3 mL, Rfl: 1 ?  tamsulosin (FLOMAX) 0.4 MG CAPS capsule, Take 1 capsule (0.4 mg total) by mouth daily., Disp: 90 capsule, Rfl: 3 ?  Vitamin D, Ergocalciferol, (DRISDOL) 1.25 MG (50000 UNIT) CAPS capsule, Take 1 capsule (50,000 Units total) by mouth 2 (two) times a week., Disp: 24 capsule, Rfl: 1 ?  amphetamine-dextroamphetamine (ADDERALL XR) 25 MG 24 hr capsule, Take 1 capsule by mouth daily., Disp: 30 capsule, Rfl: 0 ?   Galcanezumab-gnlm (EMGALITY) 120 MG/ML SOAJ, Inject 1 mL into the skin every 30 (thirty) days., Disp: 1 mL, Rfl: 0 ? ?Current Facility-Administered Medications:  ?  0.9 %  sodium chloride infusion, ,

## 2021-05-17 LAB — CBC WITH DIFFERENTIAL/PLATELET
Basophils Absolute: 0 10*3/uL (ref 0.0–0.2)
Basos: 1 %
EOS (ABSOLUTE): 0 10*3/uL (ref 0.0–0.4)
Eos: 1 %
Hematocrit: 38 % (ref 34.0–46.6)
Hemoglobin: 12.9 g/dL (ref 11.1–15.9)
Immature Grans (Abs): 0 10*3/uL (ref 0.0–0.1)
Immature Granulocytes: 1 %
Lymphocytes Absolute: 1.3 10*3/uL (ref 0.7–3.1)
Lymphs: 22 %
MCH: 31.4 pg (ref 26.6–33.0)
MCHC: 33.9 g/dL (ref 31.5–35.7)
MCV: 93 fL (ref 79–97)
Monocytes Absolute: 0.4 10*3/uL (ref 0.1–0.9)
Monocytes: 7 %
Neutrophils Absolute: 4.1 10*3/uL (ref 1.4–7.0)
Neutrophils: 68 %
Platelets: 305 10*3/uL (ref 150–450)
RBC: 4.11 x10E6/uL (ref 3.77–5.28)
RDW: 12.9 % (ref 11.7–15.4)
WBC: 5.9 10*3/uL (ref 3.4–10.8)

## 2021-05-17 LAB — IGG, IGA, IGM
IgA/Immunoglobulin A, Serum: 98 mg/dL (ref 87–352)
IgG (Immunoglobin G), Serum: 899 mg/dL (ref 586–1602)
IgM (Immunoglobulin M), Srm: 38 mg/dL (ref 26–217)

## 2021-05-31 ENCOUNTER — Encounter: Payer: Self-pay | Admitting: Nurse Practitioner

## 2021-06-06 ENCOUNTER — Other Ambulatory Visit: Payer: Self-pay

## 2021-06-06 MED ORDER — WEGOVY 1.7 MG/0.75ML ~~LOC~~ SOAJ: 1.7000 mg | SUBCUTANEOUS | 0 refills | Status: DC

## 2021-06-19 ENCOUNTER — Other Ambulatory Visit: Payer: Self-pay | Admitting: Neurology

## 2021-06-27 ENCOUNTER — Other Ambulatory Visit: Payer: Self-pay | Admitting: Nurse Practitioner

## 2021-06-30 ENCOUNTER — Ambulatory Visit (INDEPENDENT_AMBULATORY_CARE_PROVIDER_SITE_OTHER): Payer: BC Managed Care – PPO | Admitting: Ophthalmology

## 2021-06-30 ENCOUNTER — Encounter (INDEPENDENT_AMBULATORY_CARE_PROVIDER_SITE_OTHER): Payer: Self-pay | Admitting: Ophthalmology

## 2021-06-30 DIAGNOSIS — H25813 Combined forms of age-related cataract, bilateral: Secondary | ICD-10-CM | POA: Diagnosis not present

## 2021-06-30 DIAGNOSIS — H35063 Retinal vasculitis, bilateral: Secondary | ICD-10-CM | POA: Diagnosis not present

## 2021-06-30 DIAGNOSIS — G35 Multiple sclerosis: Secondary | ICD-10-CM | POA: Diagnosis not present

## 2021-06-30 DIAGNOSIS — H3020 Posterior cyclitis, unspecified eye: Secondary | ICD-10-CM

## 2021-06-30 NOTE — Progress Notes (Signed)
[facility] Note  06/30/2021     CHIEF COMPLAINT Patient presents for Retina Evaluation   HISTORY OF PRESENT ILLNESS: Kelly Alvarado is a 46 y.o. female who presents to the clinic today for:   HPI     Retina Evaluation   In both eyes.  This started 1 day ago.  Duration of 1 day.  I, the attending physician,  performed the HPI with the patient and updated documentation appropriately.        Comments   Patient here for Retina evaluation. Referred by DR Katy Fitch. Patient states vision not great. It is blurry. No eye pain. Dr Katy Fitch saw something.       Last edited by Bernarda Caffey, MD on 07/04/2021 11:23 AM.    Pt is here on the referral of Dr. Katy Fitch for concern of retinal tear, pt has seen Dr. Jule Ser previously, she was referred there by her neurologist who manages her MS, Dr. Jule Ser sent her to [place] where she saw Dr. Helane Rima, pt states her vision is very blurry and she sees "tentacles" in her right eye, pt was dx in 2010 with MS, she has hx of iritis, which was treated by Dr. Ricki Miller, pt takes Ocrevus for MS, pt denies being diabetic or hypertensive  Referring physician: Debbra Riding, MD [address] [place],  [place] [address]  HISTORICAL INFORMATION:   Selected notes from the MEDICAL RECORD NUMBER Referred by Dr. Wyatt Portela for concern of retinal tear LEE:  Ocular Hx- PMH-    CURRENT MEDICATIONS: No current outpatient medications on file. (Ophthalmic Drugs)   No current facility-administered medications for this visit. (Ophthalmic Drugs)   Current Outpatient Medications (Other)  Medication Sig   amphetamine-dextroamphetamine (ADDERALL XR) 25 MG 24 hr capsule Take 1 capsule by mouth daily.   betamethasone dipropionate 0.05 % cream Apply topically 2 (two) times daily.   dicyclomine (BENTYL) 20 MG tablet TAKE 1 TABLET BY MOUTH EVERY 6 HOURS.   doxepin (SINEQUAN) 10 MG capsule Take 1 capsule (10 mg total) by mouth at bedtime.    EMGALITY 120 MG/ML SOAJ INJECT 1 ML INTO THE SKIN ONCE EVERY 30 DAYS   fluticasone (FLONASE) 50 MCG/ACT nasal spray SPRAY 2 SPRAYS INTO EACH NOSTRIL EVERY DAY   gabapentin (NEURONTIN) 300 MG capsule TAKE 1 CAPSULE IN AM, AND 1 CAP IN EVENING, AND 2 AT BEDTIME   hydrocortisone cream 1 % Apply to affected area 2 times daily   levocetirizine (XYZAL) 5 MG tablet TAKE 1 TABLET BY MOUTH EVERY DAY EVERY EVENING   Meloxicam 15 MG TBDP Take 1 tablet by mouth daily.   ocrelizumab (OCREVUS) 300 MG/10ML injection Inject 600 mg into the vein every 6 (six) months.   rizatriptan (MAXALT-MLT) 10 MG disintegrating tablet TAKE 1 TABLET BY MOUTH AS NEEDED FOR MIGRAINE. MAY REPEAT IN 2 HOURS IF NEEDED   tamsulosin (FLOMAX) 0.4 MG CAPS capsule Take 1 capsule (0.4 mg total) by mouth daily.   Vitamin D, Ergocalciferol, (DRISDOL) 1.25 MG (50000 UNIT) CAPS capsule Take 1 capsule (50,000 Units total) by mouth 2 (two) times a week.   WEGOVY 1.7 MG/0.75ML SOAJ INJECT 1.'7MG'$  ONCE A WEEK SUBCUTANEOUSLY   predniSONE (STERAPRED UNI-PAK 21 TAB) 10 MG (21) TBPK tablet Take as directed (Patient not taking: Reported on 06/30/2021)   Current Facility-Administered Medications (Other)  Medication Route   0.9 %  sodium chloride infusion Intravenous   REVIEW OF SYSTEMS: ROS   Positive for:  Eyes Negative for: Constitutional, Gastrointestinal, Neurological, Skin, Genitourinary, Musculoskeletal, HENT, Endocrine, Cardiovascular, Respiratory, Psychiatric, Allergic/Imm, Heme/Lymph Last edited by Leonie Douglas, COA on 06/30/2021  1:19 PM.     ALLERGIES Allergies  Allergen Reactions   Amoxicillin Hives    Has patient had a PCN reaction causing immediate rash, facial/tongue/throat swelling, SOB or lightheadedness with hypotension: Yes Has patient had a PCN reaction causing severe rash involving mucus membranes or skin necrosis: Yes Has patient had a PCN reaction that required hospitalization No Has patient had a PCN reaction occurring  within the last 10 years: No If all of the above answers are "NO", then may proceed with Cephalosporin use.    Milk-Related Compounds Diarrhea and Nausea And Vomiting    Stomach cramps   Yellow Dyes (Non-Tartrazine) Hives   PAST MEDICAL HISTORY Past Medical History:  Diagnosis Date   Allergy    Anemia    HEAVY PERIODS   Headache(784.0)    MIGRAINES   Multiple sclerosis ([place])    Neuromuscular disorder ([place])    MS   Pulmonary embolism ([place]) 2013   lt-?bcp   Wears glasses    Past Surgical History:  Procedure Laterality Date   ABDOMINAL HYSTERECTOMY     BILATERAL SALPINGECTOMY Bilateral 12/27/2015   Procedure: BILATERAL SALPINGECTOMY;  Surgeon: Everett Graff, MD;  Location: [facility];  Service: Gynecology;  Laterality: Bilateral;  Excision of paratubal cyst   BREAST LUMPECTOMY WITH RADIOACTIVE SEED LOCALIZATION Left 10/06/2013   Procedure:  RADIOACTIVE SEED LOCALIZATION LEFT BREAST LUMPECTOMY;  Surgeon: Odis Hollingshead, MD;  Location: [facility];  Service: General;  Laterality: Left;   BREAST REDUCTION SURGERY     BREAST SURGERY     CYSTOSCOPY N/A 12/27/2015   Procedure: CYSTOSCOPY;  Surgeon: Everett Graff, MD;  Location: [facility];  Service: Gynecology;  Laterality: N/A;   REPAIR VAGINAL CUFF N/A 01/09/2016   Procedure: REPAIR VAGINAL CUFF;  Surgeon: Everett Graff, MD;  Location: [facility];  Service: Gynecology;  Laterality: N/A;   VAGINAL HYSTERECTOMY Bilateral 12/27/2015   Procedure: HYSTERECTOMY VAGINAL;  Surgeon: Everett Graff, MD;  Location: [facility];  Service: Gynecology;  Laterality: Bilateral;   WISDOM TOOTH EXTRACTION     FAMILY HISTORY Family History  Problem Relation Age of Onset   Asthma Mother    Hypertension Father    Cancer Paternal Uncle    Cancer Paternal Grandfather    SOCIAL HISTORY Social History   Tobacco Use   Smoking status: Never   Smokeless tobacco: Never  Vaping Use   Vaping Use: Never used  Substance Use Topics   Alcohol use:  Not Currently   Drug use: No       OPHTHALMIC EXAM:  Base Eye Exam     Visual Acuity (Snellen - Linear)       Right Left   Dist [place] 20/200 -2 20/20   Dist ph [place] 20/40 -2          Tonometry (Tonopen, 1:07 PM)       Right Left   Pressure 14 15         Pupils       Dark Light Shape React APD   Right 4 3 Round Brisk None   Left 4 3 Round Brisk None         Visual Fields       Left Right    Full Full         Extraocular Movement       Right  Left    Full Full         Neuro/Psych     Oriented x3: Yes   Mood/Affect: Normal         Dilation     Both eyes: 1.0% Mydriacyl, 2.5% Phenylephrine @ 1:07 PM           Slit Lamp and Fundus Exam     Slit Lamp Exam       Right Left   Lids/Lashes Dermatochalasis - upper lid Dermatochalasis - upper lid   Conjunctiva/Sclera White and quiet mild melanosis   Cornea Clear Clear   Anterior Chamber deep and clear, no cell or flare deep and clear, no cell or flare   Iris Round and dilated Round and dilated   Lens 1-2+ Nuclear sclerosis, 1-2+ Cortical cataract 1-2+ Nuclear sclerosis, 1-2+ Cortical cataract   Anterior Vitreous Vitreous syneresis; trace, fine pigment; vitreous condensations inferiorly Mild vitreous syneresis, no cell or pigment         Fundus Exam       Right Left   Disc Pink and Sharp, mild PPA Pink and Sharp, mild temporal PPA   C/D Ratio 0.2 0.2   Macula Flat, Good foveal reflex, RPE mottling, mild Drusen temporal macula Flat, Good foveal reflex, RPE mottling, no heme or edema   Vessels attenuated, mild copper wiring, mild tortuosity, mild perivascular sheathing attenuated, mild copper wiring, mild tortuosity; perivascular sheathing   Periphery Attached, pigmented cystoid degeneration, No heme, No RT/RD, trace inferior snow banking Attached, no heme, very fine VR tuft at 1200, vitreous condensations settled inferiorly--simulating break at 0600, but no break or tear on scleral depression;  trace inferior snow banking           Refraction     Manifest Refraction       Sphere Cylinder Axis Dist VA   Right -3.25 +1.00 045 20/30-2   Left -0.75 Sphere  20/20           IMAGING AND PROCEDURES  Imaging and Procedures for 06/30/2021  OCT, Retina - OU - Both Eyes       Right Eye Quality was good. Central Foveal Thickness: 279. Progression has no prior data. Findings include normal foveal contour, no IRF, no SRF.   Left Eye Central Foveal Thickness: 260. Progression has no prior data. Findings include normal foveal contour, no IRF, no SRF.   Notes *Images captured and stored on drive  Diagnosis / Impression:  NFP, no IRF / SRF OU  Clinical management:  See below  Abbreviations: NFP - Normal foveal profile. CME - cystoid macular edema. PED - pigment epithelial detachment. IRF - intraretinal fluid. SRF - subretinal fluid. EZ - ellipsoid zone. ERM - epiretinal membrane. ORA - outer retinal atrophy. ORT - outer retinal tubulation. SRHM - subretinal hyper-reflective material. 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 Helane Rima by Dr. Jule Ser who both noted intermediate uveitis  -  mild vitreous condensations and tr snowbanking inferiorly  - OS with prominent condensations inferiorly simulating retinal break -- however no RT/RD on scleral depression  - no obvious active inflammation at this time, but recommend FA to fully evaluate uveitis  - f/u in 3-6 mos -- DFE/OCT, FA  2. Retinal vasculitis OU  - exam with mild  peripheral sheathing of venules OU  - will check FA in 3-6 mos  3. Mixed Cataract OU - The symptoms of cataract, surgical options, and treatments and risks were discussed with patient. - discussed diagnosis and progression - not yet visually significant - monitor for now  Ophthalmic Meds Ordered this visit:  No orders of the defined types were placed in this encounter.    Return for f/u 3-6 months, intermediate uveitis OU, DFE, OCT.  There are no Patient Instructions on file for this visit.   Explained the diagnoses, plan, and follow up with the patient and they expressed understanding.  Patient expressed understanding of the importance of proper follow up care.   This document serves as a record of services personally performed by Gardiner Sleeper, MD, PhD. It was created on their behalf by San Jetty. Owens Shark, OA an ophthalmic technician. The creation of this record is the provider's dictation and/or activities during the visit.    Electronically signed by: San Jetty. Owens Shark, [place] 05.26.2023 11:36 AM   Gardiner Sleeper, M.D., Ph.D. Diseases & Surgery of the Retina and Vitreous [facility]  I have reviewed the above documentation for accuracy and completeness, and I agree with the above. Gardiner Sleeper, M.D., Ph.D. 07/04/21 11:36 AM   Abbreviations: M myopia (nearsighted); A astigmatism; H hyperopia (farsighted); P presbyopia; Mrx spectacle prescription;  CTL contact lenses; OD right eye; OS left eye; OU both eyes  XT exotropia; ET esotropia; PEK punctate epithelial keratitis; PEE punctate epithelial erosions; DES dry eye syndrome; MGD meibomian gland dysfunction; ATs artificial tears; PFAT's preservative free artificial tears; Burns nuclear sclerotic cataract; PSC posterior subcapsular cataract; ERM epi-retinal membrane; PVD posterior vitreous detachment; RD retinal detachment; DM diabetes mellitus; DR diabetic retinopathy; NPDR non-proliferative diabetic retinopathy;  PDR proliferative diabetic retinopathy; CSME clinically significant macular edema; DME diabetic macular edema; dbh dot blot hemorrhages; CWS cotton wool spot; POAG primary open angle glaucoma; C/D cup-to-disc ratio; HVF humphrey visual field; GVF goldmann visual field; OCT optical coherence tomography; IOP intraocular pressure; BRVO Branch retinal vein occlusion; CRVO central retinal vein occlusion; CRAO central retinal artery occlusion; BRAO branch retinal artery occlusion; RT retinal tear; SB scleral buckle; PPV pars plana vitrectomy; VH Vitreous hemorrhage; PRP panretinal laser photocoagulation; IVK intravitreal kenalog; VMT vitreomacular traction; MH Macular hole;  NVD neovascularization of the disc; NVE neovascularization elsewhere; AREDS age related eye disease study; ARMD age related macular degeneration; POAG primary open angle glaucoma; EBMD epithelial/anterior basement membrane dystrophy; ACIOL anterior chamber intraocular lens; IOL intraocular lens; PCIOL posterior chamber intraocular lens; Phaco/IOL phacoemulsification with intraocular lens placement; [place] photorefractive keratectomy; LASIK laser assisted in situ keratomileusis; HTN hypertension; DM diabetes mellitus; COPD chronic obstructive pulmonary disease

## 2021-07-04 ENCOUNTER — Encounter (INDEPENDENT_AMBULATORY_CARE_PROVIDER_SITE_OTHER): Payer: Self-pay | Admitting: Ophthalmology

## 2021-07-18 ENCOUNTER — Other Ambulatory Visit: Payer: Self-pay | Admitting: Nurse Practitioner

## 2021-08-09 ENCOUNTER — Other Ambulatory Visit: Payer: Self-pay | Admitting: Nurse Practitioner

## 2021-09-07 ENCOUNTER — Other Ambulatory Visit: Payer: Self-pay | Admitting: Nurse Practitioner

## 2021-09-11 ENCOUNTER — Other Ambulatory Visit: Payer: Self-pay | Admitting: Neurology

## 2021-09-12 MED ORDER — AMPHETAMINE-DEXTROAMPHET ER 25 MG PO CP24: 25.0000 mg | ORAL_CAPSULE | Freq: Every day | ORAL | 0 refills | Status: DC

## 2021-09-12 NOTE — Telephone Encounter (Signed)
Last OV was on 05/16/21.  Next OV is scheduled for 12/07/21.  Last RX was written on 06/01/21 for 30 tabs.   [place] [facility] has been reviewed.

## 2021-09-13 ENCOUNTER — Telehealth: Payer: Self-pay | Admitting: *Deleted

## 2021-09-13 NOTE — Telephone Encounter (Signed)
Submitted PA on CMM. Key: [id number]. Waiting on determination from [place].

## 2021-09-13 NOTE — Telephone Encounter (Signed)
Received fax from [place] that [facility] approved 09/13/21-09/13/24. [place] [phone] Non-grandfathered [phone].

## 2021-09-25 ENCOUNTER — Other Ambulatory Visit: Payer: Self-pay | Admitting: Neurology

## 2021-09-25 ENCOUNTER — Encounter: Payer: Self-pay | Admitting: Neurology

## 2021-09-25 MED ORDER — RIZATRIPTAN BENZOATE 10 MG PO TBDP: ORAL_TABLET | ORAL | 0 refills | Status: DC

## 2021-09-27 ENCOUNTER — Other Ambulatory Visit: Payer: Self-pay | Admitting: Nurse Practitioner

## 2021-10-13 ENCOUNTER — Other Ambulatory Visit: Payer: Self-pay | Admitting: Internal Medicine

## 2021-10-24 ENCOUNTER — Encounter: Payer: Self-pay | Admitting: Nurse Practitioner

## 2021-10-24 ENCOUNTER — Encounter: Payer: Self-pay | Admitting: Internal Medicine

## 2021-10-24 ENCOUNTER — Telehealth (INDEPENDENT_AMBULATORY_CARE_PROVIDER_SITE_OTHER): Payer: BC Managed Care – PPO | Admitting: Internal Medicine

## 2021-10-24 VITALS — Ht 64.0 in | Wt 150.0 lb

## 2021-10-24 DIAGNOSIS — U071 COVID-19: Secondary | ICD-10-CM

## 2021-10-24 MED ORDER — HYDROCODONE BIT-HOMATROP MBR 5-1.5 MG/5ML PO SOLN: 5.0000 mL | Freq: Four times a day (QID) | ORAL | 0 refills | Status: DC | PRN

## 2021-10-24 MED ORDER — MOLNUPIRAVIR 200 MG PO CAPS: 4.0000 | ORAL_CAPSULE | Freq: Two times a day (BID) | ORAL | 0 refills | Status: AC

## 2021-10-24 NOTE — Progress Notes (Signed)
Virtual Visit via Video   This visit type was conducted due to national recommendations for restrictions regarding the COVID-19 Pandemic (e.g. social distancing) in an effort to limit this patient's exposure and mitigate transmission in our community.  Due to her co-morbid illnesses, this patient is at least at moderate risk for complications without adequate follow up.  This format is felt to be most appropriate for this patient at this time.  All issues noted in this document were discussed and addressed.  A limited physical exam was performed with this format.    This visit type was conducted due to national recommendations for restrictions regarding the COVID-19 Pandemic (e.g. social distancing) in an effort to limit this patient's exposure and mitigate transmission in our community.  Patients identity confirmed using two different identifiers.  This format is felt to be most appropriate for this patient at this time.  All issues noted in this document were discussed and addressed.  No physical exam was performed (except for noted visual exam findings with Video Visits).    Date:  10/29/2021   ID:  EH SAUSEDA, DOB Dec 02, 1975, MRN [id number]  Patient Location:  Home  Provider location:   Office    Chief Complaint:  "I have COVID"  History of Present Illness:    Kelly Alvarado is a 46 y.o. female who presents via video conferencing for a telehealth visit today.    The patient does have symptoms concerning for COVID-19 infection (fever, chills, cough, or new shortness of breath).   She presents today for virtual visit. She prefers this method of contact due to COVID-19 pandemic.  She presents today for treatment of COVID. Saturday, she developed a sore throat.  Yesterday, she developed night sweats, chills, headache and a cough. She also had body aches.  She took a home rapid test last night which was positive. She reports she went to a tennis match on Saturday and a career fair on  Friday, not able to determine where exactly she was exposed.        Past Medical History:  Diagnosis Date   Allergy    Anemia    HEAVY PERIODS   Headache(784.0)    MIGRAINES   Multiple sclerosis ([place])    Neuromuscular disorder ([place])    MS   Pulmonary embolism ([place]) 2013   lt-?bcp   Wears glasses    Past Surgical History:  Procedure Laterality Date   ABDOMINAL HYSTERECTOMY     BILATERAL SALPINGECTOMY Bilateral 12/27/2015   Procedure: BILATERAL SALPINGECTOMY;  Surgeon: Everett Graff, MD;  Location: [facility];  Service: Gynecology;  Laterality: Bilateral;  Excision of paratubal cyst   BREAST LUMPECTOMY WITH RADIOACTIVE SEED LOCALIZATION Left 10/06/2013   Procedure:  RADIOACTIVE SEED LOCALIZATION LEFT BREAST LUMPECTOMY;  Surgeon: Odis Hollingshead, MD;  Location: [facility];  Service: General;  Laterality: Left;   BREAST REDUCTION SURGERY     BREAST SURGERY     CYSTOSCOPY N/A 12/27/2015   Procedure: CYSTOSCOPY;  Surgeon: Everett Graff, MD;  Location: [facility];  Service: Gynecology;  Laterality: N/A;   REPAIR VAGINAL CUFF N/A 01/09/2016   Procedure: REPAIR VAGINAL CUFF;  Surgeon: Everett Graff, MD;  Location: [facility];  Service: Gynecology;  Laterality: N/A;   VAGINAL HYSTERECTOMY Bilateral 12/27/2015   Procedure: HYSTERECTOMY VAGINAL;  Surgeon: Everett Graff, MD;  Location: [facility];  Service: Gynecology;  Laterality: Bilateral;   WISDOM TOOTH EXTRACTION       Current Meds  Medication Sig  amphetamine-dextroamphetamine (ADDERALL XR) 25 MG 24 hr capsule Take 1 capsule by mouth daily.   dicyclomine (BENTYL) 20 MG tablet TAKE 1 TABLET BY MOUTH EVERY 6 HOURS.   doxepin (SINEQUAN) 10 MG capsule Take 1 capsule (10 mg total) by mouth at bedtime.   EMGALITY 120 MG/ML SOAJ INJECT 1 ML INTO THE SKIN ONCE EVERY 30 DAYS   fluticasone (FLONASE) 50 MCG/ACT nasal spray SPRAY 2 SPRAYS INTO EACH NOSTRIL EVERY DAY   gabapentin (NEURONTIN) 300 MG capsule TAKE 1 CAPSULE IN AM, AND 1  CAP IN EVENING, AND 2 AT BEDTIME   HYDROcodone bit-homatropine (HYDROMET) 5-1.5 MG/5ML syrup Take 5 mLs by mouth every 6 (six) hours as needed for cough.   hydrocortisone cream 1 % Apply to affected area 2 times daily   levocetirizine (XYZAL) 5 MG tablet TAKE 1 TABLET BY MOUTH ONCE DAILY IN THE EVENING   molnupiravir EUA (LAGEVRIO) 200 MG CAPS capsule Take 4 capsules (800 mg total) by mouth 2 (two) times daily for 5 days.   ocrelizumab (OCREVUS) 300 MG/10ML injection Inject 600 mg into the vein every 6 (six) months.   rizatriptan (MAXALT-MLT) 10 MG disintegrating tablet TAKE 1 TABLET BY MOUTH AS NEEDED FOR MIGRAINE. MAY REPEAT IN 2 HOURS IF NEEDED   Vitamin D, Ergocalciferol, (DRISDOL) 1.25 MG (50000 UNIT) CAPS capsule Take 1 capsule (50,000 Units total) by mouth 2 (two) times a week.   WEGOVY 1.7 MG/0.75ML SOAJ INJECT 1.'7MG'$  ONCE A WEEK   Current Facility-Administered Medications for the 10/24/21 encounter (Video Visit) with [name], MD  Medication   0.9 %  sodium chloride infusion     Allergies:   Amoxicillin, Milk-related compounds, and Yellow dyes (non-tartrazine)   Social History   Tobacco Use   Smoking status: Never   Smokeless tobacco: Never  Vaping Use   Vaping Use: Never used  Substance Use Topics   Alcohol use: Not Currently   Drug use: No     Family Hx: The patient's family history includes Asthma in her mother; Cancer in her paternal grandfather and paternal uncle; Hypertension in her father.  ROS:   Please see the history of present illness.    Review of Systems  Constitutional:  Positive for malaise/fatigue.  HENT:  Positive for congestion.   Respiratory:  Positive for cough. Negative for shortness of breath.   Cardiovascular: Negative.   Gastrointestinal: Negative.   Neurological: Negative.   Psychiatric/Behavioral: Negative.      All other systems reviewed and are negative.   Labs/Other Tests and Data Reviewed:    Recent Labs: 05/04/2021: ALT 6;  BUN 7; Creatinine, Ser 0.83; Potassium 3.7; Sodium 140; TSH 2.690 05/16/2021: Hemoglobin 12.9; Platelets 305   Recent Lipid Panel Lab Results  Component Value Date/Time   CHOL 216 (H) 05/04/2021 09:18 AM   TRIG 116 05/04/2021 09:18 AM   HDL 66 05/04/2021 09:18 AM   CHOLHDL 3.3 05/04/2021 09:18 AM   CHOLHDL 2.8 10/11/2008 03:30 AM   LDLCALC 130 (H) 05/04/2021 09:18 AM    Wt Readings from Last 3 Encounters:  10/24/21 150 lb (68 kg)  05/16/21 150 lb 8 oz (68.3 kg)  05/04/21 152 lb 3.2 oz (69 kg)     Exam:    Vital Signs:  Ht '5\' 4"'$  (1.626 m)   Wt 150 lb (68 kg)   LMP 12/11/2015 (Approximate)   BMI 25.75 kg/m     Physical Exam Vitals and nursing note reviewed.  HENT:     Head: Normocephalic and  atraumatic.  Pulmonary:     Effort: Pulmonary effort is normal.     Comments: She is able to speak in full sentences. Musculoskeletal:     Cervical back: Normal range of motion.  Neurological:     Mental Status: She is alert and oriented to person, place, and time.  Psychiatric:        Mood and Affect: Affect normal.     ASSESSMENT & PLAN:    1. COVID-19 She requests antiviral treatment, she has no recent renal function so I will send Lagevrio. Possible side effects d/w pt. Advised patient to take Vitamin C, D, Zinc.  Keep yourself hydrated with a lot of water and rest. Take Delsym for cough and Mucinex as needed. Take Tylenol or pain reliever every 4-6 hours as needed for pain/fever/body ache. If you have elevated blood pressure, you can take OTC Coricidin. You can also take OTC Oscillococcinum, a homeopathic remedy,  to help with your symptoms.  Educated patient that if symptoms get worse or if he/she experiences any SOB, chest pain or pain in their legs to seek immediate emergency care. Continue to monitor your oxygen levels. Call office ASAP if you have any questions. Quarantine for 5 days if tested positive and no symptoms or 10 days if tested positive and you are with symptoms.  Wear a mask around other people.     COVID-19 Education: The signs and symptoms of COVID-19 were discussed with the patient and how to seek care for testing (follow up with PCP or arrange E-visit).  The importance of social distancing was discussed today.  Patient Risk:   After full review of this patients clinical status, I feel that they are at least moderate risk at this time.  Time:   Today, I have spent 18 minutes/ seconds with the patient with telehealth technology discussing above diagnoses.  (Initially had issues with mic on cell phone, had to send email to conduct the visit)   Medication Adjustments/Labs and Tests Ordered: Current medicines are reviewed at length with the patient today.  Concerns regarding medicines are outlined above.   Tests Ordered: No orders of the defined types were placed in this encounter.   Medication Changes: Meds ordered this encounter  Medications   HYDROcodone bit-homatropine (HYDROMET) 5-1.5 MG/5ML syrup    Sig: Take 5 mLs by mouth every 6 (six) hours as needed for cough.    Dispense:  120 mL    Refill:  0   molnupiravir EUA (LAGEVRIO) 200 MG CAPS capsule    Sig: Take 4 capsules (800 mg total) by mouth 2 (two) times daily for 5 days.    Dispense:  40 capsule    Refill:  0    Disposition:  Follow up prn  Signed, [name], MD

## 2021-10-24 NOTE — Patient Instructions (Signed)
Quarantine and Isolation Quarantine and isolation refer to local and travel restrictions to protect the public and travelers from contagious diseases that constitute a public health threat. Contagious diseases are diseases that can spread from one person to another. Quarantine and isolation help to protect the public by preventing exposure to people who have or may have a contagious disease. Isolation separates people who are sick with a contagious disease from people who are not sick. Quarantine separates and restricts the movement of people who were exposed to a contagious disease to see if they become sick. You may be put in quarantine or isolation if you have been exposed to or diagnosed with any of the following diseases: Severe acute respiratory syndromes, such as COVID-19. Cholera. Diphtheria. Tuberculosis. Plague. Smallpox. Yellow fever. Viral hemorrhagic fevers, such as [place], Ebola, and [place]. When to quarantine or isolate Follow these rules, whether you have been vaccinated or not: Stay home and isolate from others when you are sick with a contagious disease. Isolate when you test positive for a contagious disease, even if you do not have symptoms. Isolate if you are sick and suspect that you may have a contagious disease. If you suspect that you have a contagious disease, get tested. If your test results are negative, you can end your isolation. If your test results are positive, follow the full isolation recommendations as told by your health care provider or local health authorities. Quarantine and stay away from others when you have been in close contact with someone who has tested positive for a contagious disease. Close contact is defined as being less than 6 ft (1.8 m) away from an infected person for a total of 15 minutes or more over a 24-hour period. Do not go to places where you are unable to wear a mask, such as restaurants and some gyms. Stay home and separate  from others as much as possible. Avoid being around people who may get very sick from the contagious disease that you have. Use a separate bathroom, if possible. Do not travel. For travel guidance, visit the CDC's travel webpage at wwwnc.cdc.gov/travel/ Follow these instructions at home: Medicines  Take over-the-counter and prescription medicines as told by your health care provider. Finish all antibiotic medicine even when you start to feel better. Stay up to date with all your vaccines. Get scheduled vaccines and boosters as recommended by your health care provider. Lifestyle Wear a high-quality mask if you must be around others at home and in public, if recommended. Improve air flow (ventilation) at home to help prevent the disease from spreading to other people, if possible. Do not share personal household items, like cups, towels, and utensils. Practice everyday hygiene and cleaning. General instructions Talk to your health care provider if you have a weakened body defense system (immune system). People with a weakened immune system may have a reduced immune response to vaccines. You may need to follow current prevention measures, including wearing a well-fitting mask, avoiding crowds, and avoiding poorly ventilated indoor places. Monitor symptoms and follow health care provider instructions, which may include resting, drinking fluids, and taking medicines. Follow specific isolation and quarantine recommendations if you are in places that can lead to disease outbreaks, such as correctional and detention facilities, homeless shelters, and cruise ships. Return to your normal activities as told by your health care provider. Ask your health care provider what activities are safe for you. Keep all follow-up visits. This is important. Where to find more information CDC: www.cdc.gov/quarantine/index.html Contact   a health care provider if: You have a fever. You have signs and symptoms that  return or get worse after isolation. Get help right away if: You have difficulty breathing. You have chest pain. These symptoms may be an emergency. Get help right away. Call 911. Do not wait to see if the symptoms will go away. Do not drive yourself to the hospital. Summary Isolation and quarantine help protect the public by preventing exposure to people who have or may have a contagious disease. Isolate when you are sick or when you test positive, even if you do not have symptoms. Quarantine and stay away from others when you have been in close contact with someone who has tested positive for a contagious disease. This information is not intended to replace advice given to you by your health care provider. Make sure you discuss any questions you have with your health care provider. Document Revised: 02/02/2021 Document Reviewed: 01/12/2021 Elsevier Patient Education  2023 Elsevier Inc.  

## 2021-11-06 LAB — HM MAMMOGRAPHY: HM Mammogram: NORMAL (ref 0–4)

## 2021-11-07 ENCOUNTER — Ambulatory Visit (INDEPENDENT_AMBULATORY_CARE_PROVIDER_SITE_OTHER): Payer: BC Managed Care – PPO | Admitting: Internal Medicine

## 2021-11-07 ENCOUNTER — Encounter: Payer: Self-pay | Admitting: Internal Medicine

## 2021-11-07 VITALS — BP 128/68 | HR 77 | Temp 98.5°F | Ht 64.0 in | Wt 143.4 lb

## 2021-11-07 DIAGNOSIS — E7800 Pure hypercholesterolemia, unspecified: Secondary | ICD-10-CM | POA: Diagnosis not present

## 2021-11-07 DIAGNOSIS — U099 Post covid-19 condition, unspecified: Secondary | ICD-10-CM | POA: Diagnosis not present

## 2021-11-07 DIAGNOSIS — Z6824 Body mass index (BMI) 24.0-24.9, adult: Secondary | ICD-10-CM | POA: Diagnosis not present

## 2021-11-07 DIAGNOSIS — Z1211 Encounter for screening for malignant neoplasm of colon: Secondary | ICD-10-CM

## 2021-11-07 DIAGNOSIS — K591 Functional diarrhea: Secondary | ICD-10-CM

## 2021-11-07 DIAGNOSIS — R1084 Generalized abdominal pain: Secondary | ICD-10-CM

## 2021-11-07 DIAGNOSIS — Z2821 Immunization not carried out because of patient refusal: Secondary | ICD-10-CM

## 2021-11-07 LAB — LIPID PANEL
Chol/HDL Ratio: 2.7 ratio (ref 0.0–4.4)
Cholesterol, Total: 186 mg/dL (ref 100–199)
HDL: 70 mg/dL (ref >39)
LDL Chol Calc (NIH): 101 mg/dL — ABNORMAL HIGH (ref 0–99)
Triglycerides: 85 mg/dL (ref 0–149)
VLDL Cholesterol Cal: 15 mg/dL (ref 5–40)

## 2021-11-07 MED ORDER — CYANOCOBALAMIN 1000 MCG/ML IJ SOLN
1000.0000 ug | Freq: Once | INTRAMUSCULAR | Status: AC
  Administered 2021-11-07: 1000 ug via INTRAMUSCULAR

## 2021-11-07 MED ORDER — LEVOCETIRIZINE DIHYDROCHLORIDE 5 MG PO TABS: ORAL_TABLET | ORAL | 2 refills | Status: DC

## 2021-11-07 MED ORDER — BETAMETHASONE DIPROPIONATE 0.05 % EX CREA: TOPICAL_CREAM | Freq: Two times a day (BID) | CUTANEOUS | 2 refills | Status: DC

## 2021-11-07 MED ORDER — WEGOVY 1.7 MG/0.75ML ~~LOC~~ SOAJ: SUBCUTANEOUS | 2 refills | Status: DC

## 2021-11-07 MED ORDER — DICYCLOMINE HCL 20 MG PO TABS: 20.0000 mg | ORAL_TABLET | Freq: Four times a day (QID) | ORAL | 1 refills | Status: DC

## 2021-11-07 NOTE — Patient Instructions (Signed)

## 2021-11-07 NOTE — Progress Notes (Unsigned)
Kelly Alvarado,acting as a Education administrator for Kelly Greenland, MD.,have documented all relevant documentation on the behalf of Kelly Greenland, MD,as directed by  Kelly Greenland, MD while in the presence of Kelly Greenland, MD.    Subjective:     Patient ID: Kelly Alvarado , female    DOB: 27-Sep-1975 , 46 y.o.   MRN: [id number]   Chief Complaint  Patient presents with   Hyperlipidemia    HPI  Patient presents today for a cholesterol check. She is not on any chol meds, she has made some lifestyle changes. Patient was recently treated for COVID, she states she has been feeling quite tired since her diagnosis.  She is no longer having any other sx.   She also adds that she has a job opportunity and will be moving to [place], [place]. She plans to be there 1-3 years. Her son will be living here in her home.    BP Readings from Last 3 Encounters: 11/07/21 : 128/68 05/16/21 : 95/64 05/04/21 : 122/70       Past Medical History:  Diagnosis Date   Allergy    Anemia    HEAVY PERIODS   Headache(784.0)    MIGRAINES   Multiple sclerosis ([place])    Neuromuscular disorder ([facility])    MS   Pulmonary embolism ([place]) 2013   lt-?bcp   Wears glasses      Family History  Problem Relation Age of Onset   Asthma Mother    Hypertension Father    Cancer Paternal Uncle    Cancer Paternal Grandfather      Current Outpatient Medications:    amphetamine-dextroamphetamine (ADDERALL XR) 25 MG 24 hr capsule, Take 1 capsule by mouth daily., Disp: 30 capsule, Rfl: 0   betamethasone dipropionate 0.05 % cream, Apply topically 2 (two) times daily., Disp: 30 g, Rfl: 0   dicyclomine (BENTYL) 20 MG tablet, TAKE 1 TABLET BY MOUTH EVERY 6 HOURS., Disp: 90 tablet, Rfl: 1   doxepin (SINEQUAN) 10 MG capsule, Take 1 capsule (10 mg total) by mouth at bedtime., Disp: 90 capsule, Rfl: 3   EMGALITY 120 MG/ML SOAJ, INJECT 1 ML INTO THE SKIN ONCE EVERY 30 DAYS, Disp: 1 mL, Rfl: 6   fluticasone (FLONASE) 50 MCG/ACT nasal  spray, SPRAY 2 SPRAYS INTO EACH NOSTRIL EVERY DAY, Disp: 48 mL, Rfl: 3   gabapentin (NEURONTIN) 300 MG capsule, TAKE 1 CAPSULE IN AM, AND 1 CAP IN EVENING, AND 2 AT BEDTIME, Disp: 360 capsule, Rfl: 1   HYDROcodone bit-homatropine (HYDROMET) 5-1.5 MG/5ML syrup, Take 5 mLs by mouth every 6 (six) hours as needed for cough., Disp: 120 mL, Rfl: 0   hydrocortisone cream 1 %, Apply to affected area 2 times daily, Disp: 30 g, Rfl: 1   levocetirizine (XYZAL) 5 MG tablet, TAKE 1 TABLET BY MOUTH ONCE DAILY IN THE EVENING, Disp: 90 tablet, Rfl: 0   ocrelizumab (OCREVUS) 300 MG/10ML injection, Inject 600 mg into the vein every 6 (six) months., Disp: , Rfl:    rizatriptan (MAXALT-MLT) 10 MG disintegrating tablet, TAKE 1 TABLET BY MOUTH AS NEEDED FOR MIGRAINE. MAY REPEAT IN 2 HOURS IF NEEDED, Disp: 27 tablet, Rfl: 0   Vitamin D, Ergocalciferol, (DRISDOL) 1.25 MG (50000 UNIT) CAPS capsule, Take 1 capsule (50,000 Units total) by mouth 2 (two) times a week., Disp: 24 capsule, Rfl: 1   WEGOVY 1.7 MG/0.75ML SOAJ, INJECT 1.'7MG'$  ONCE A WEEK, Disp: 4 mL, Rfl: 0  Current Facility-Administered Medications:  0.9 %  sodium chloride infusion, , Intravenous, PRN, [name], FNP   Allergies  Allergen Reactions   Amoxicillin Hives    Has patient had a PCN reaction causing immediate rash, facial/tongue/throat swelling, SOB or lightheadedness with hypotension: Yes Has patient had a PCN reaction causing severe rash involving mucus membranes or skin necrosis: Yes Has patient had a PCN reaction that required hospitalization No Has patient had a PCN reaction occurring within the last 10 years: No If all of the above answers are "NO", then may proceed with Cephalosporin use.    Milk-Related Compounds Diarrhea and Nausea And Vomiting    Stomach cramps   Yellow Dyes (Non-Tartrazine) Hives     Review of Systems  Constitutional:  Positive for fatigue.  HENT: Negative.    Eyes: Negative.   Respiratory: Negative.     Cardiovascular: Negative.   Gastrointestinal: Negative.      Today's Vitals   11/07/21 0905  BP: 128/68  Pulse: 77  Temp: 98.5 F (36.9 C)  Weight: 143 lb 6.4 oz (65 kg)  Height: '5\' 4"'$  (1.626 m)  PainSc: 0-No pain   Body mass index is 24.61 kg/m.  Wt Readings from Last 3 Encounters:  11/07/21 143 lb 6.4 oz (65 kg)  10/24/21 150 lb (68 kg)  05/16/21 150 lb 8 oz (68.3 kg)    Objective:  Physical Exam Vitals and nursing note reviewed.  Constitutional:      Appearance: Normal appearance.  HENT:     Head: Normocephalic and atraumatic.  Eyes:     Extraocular Movements: Extraocular movements intact.  Cardiovascular:     Rate and Rhythm: Normal rate and regular rhythm.     Heart sounds: Normal heart sounds.  Pulmonary:     Effort: Pulmonary effort is normal.     Breath sounds: Normal breath sounds.  Musculoskeletal:     Cervical back: Normal range of motion.  Skin:    General: Skin is warm.  Neurological:     General: No focal deficit present.     Mental Status: She is alert.  Psychiatric:        Mood and Affect: Mood normal.        Behavior: Behavior normal.         Assessment And Plan:     1. Elevated cholesterol  2. Immunization declined  3. Post-COVID syndrome  4. Screen for colon cancer     Patient was given opportunity to ask questions. Patient verbalized understanding of the plan and was able to repeat key elements of the plan. All questions were answered to their satisfaction.   I, Kelly Greenland, MD, have reviewed all documentation for this visit. The documentation on 11/07/21 for the exam, diagnosis, procedures, and orders are all accurate and complete.   IF YOU HAVE BEEN REFERRED TO A SPECIALIST, IT MAY TAKE 1-2 WEEKS TO SCHEDULE/PROCESS THE REFERRAL. IF YOU HAVE NOT HEARD FROM US/SPECIALIST IN TWO WEEKS, PLEASE GIVE [place] A CALL AT [phone] X [phone].   THE PATIENT IS ENCOURAGED TO PRACTICE SOCIAL DISTANCING DUE TO THE COVID-19 PANDEMIC.

## 2021-11-08 DIAGNOSIS — E7800 Pure hypercholesterolemia, unspecified: Secondary | ICD-10-CM | POA: Insufficient documentation

## 2021-11-08 DIAGNOSIS — K591 Functional diarrhea: Secondary | ICD-10-CM | POA: Insufficient documentation

## 2021-11-08 DIAGNOSIS — Z6824 Body mass index (BMI) 24.0-24.9, adult: Secondary | ICD-10-CM | POA: Insufficient documentation

## 2021-11-08 DIAGNOSIS — U099 Post covid-19 condition, unspecified: Secondary | ICD-10-CM | POA: Insufficient documentation

## 2021-11-08 NOTE — Progress Notes (Signed)
[name],acting as a Education administrator for [name], MD.,have documented all relevant documentation on the behalf of [name], MD,as directed by  [name], MD while in the presence of [name], MD.    Subjective:     Patient ID: Kelly Alvarado , female    DOB: May 02, 1975 , 46 y.o.   MRN: [id number]   Chief Complaint  Patient presents with   Hyperlipidemia    HPI  Patient presents today for a cholesterol check. She is not on any chol meds, she has made some lifestyle changes. Patient was recently treated for COVID, she states she has been feeling quite tired since her diagnosis.  She is no longer having any other sx.   She also adds that she has a job opportunity and will be moving to [place], [place]. She plans to be there 1-3 years. Her son will be living here in her home.    BP Readings from Last 3 Encounters: 11/07/21 : 128/68 05/16/21 : 95/64 05/04/21 : 122/70    Hyperlipidemia The current episode started more than 1 month ago. She has no history of liver disease or obesity. Pertinent negatives include no chest pain or leg pain. Current antihyperlipidemic treatment includes diet change. The current treatment provides moderate improvement of lipids.     Past Medical History:  Diagnosis Date   Allergy    Anemia    HEAVY PERIODS   Headache(784.0)    MIGRAINES   Multiple sclerosis (HCC)    Neuromuscular disorder (HCC)    MS   Pulmonary embolism ([place]) 2013   lt-?bcp   Wears glasses      Family History  Problem Relation Age of Onset   Asthma Mother    Hypertension Father    Cancer Paternal Uncle    Cancer Paternal Grandfather      Current Outpatient Medications:    amphetamine-dextroamphetamine (ADDERALL XR) 25 MG 24 hr capsule, Take 1 capsule by mouth daily., Disp: 30 capsule, Rfl: 0   doxepin (SINEQUAN) 10 MG capsule, Take 1 capsule (10 mg total) by mouth at bedtime., Disp: 90 capsule, Rfl: 3   EMGALITY 120 MG/ML SOAJ, INJECT 1 ML INTO  THE SKIN ONCE EVERY 30 DAYS, Disp: 1 mL, Rfl: 6   fluticasone (FLONASE) 50 MCG/ACT nasal spray, SPRAY 2 SPRAYS INTO EACH NOSTRIL EVERY DAY, Disp: 48 mL, Rfl: 3   gabapentin (NEURONTIN) 300 MG capsule, TAKE 1 CAPSULE IN AM, AND 1 CAP IN EVENING, AND 2 AT BEDTIME, Disp: 360 capsule, Rfl: 1   HYDROcodone bit-homatropine (HYDROMET) 5-1.5 MG/5ML syrup, Take 5 mLs by mouth every 6 (six) hours as needed for cough., Disp: 120 mL, Rfl: 0   hydrocortisone cream 1 %, Apply to affected area 2 times daily, Disp: 30 g, Rfl: 1   ocrelizumab (OCREVUS) 300 MG/10ML injection, Inject 600 mg into the vein every 6 (six) months., Disp: , Rfl:    rizatriptan (MAXALT-MLT) 10 MG disintegrating tablet, TAKE 1 TABLET BY MOUTH AS NEEDED FOR MIGRAINE. MAY REPEAT IN 2 HOURS IF NEEDED, Disp: 27 tablet, Rfl: 0   Vitamin D, Ergocalciferol, (DRISDOL) 1.25 MG (50000 UNIT) CAPS capsule, Take 1 capsule (50,000 Units total) by mouth 2 (two) times a week., Disp: 24 capsule, Rfl: 1   betamethasone dipropionate 0.05 % cream, Apply topically 2 (two) times daily., Disp: 30 g, Rfl: 2   dicyclomine (BENTYL) 20 MG tablet, Take 1 tablet (20 mg total) by mouth every 6 (six) hours. prn, Disp:  90 tablet, Rfl: 1   levocetirizine (XYZAL) 5 MG tablet, TAKE 1 TABLET BY MOUTH ONCE DAILY IN THE EVENING, Disp: 90 tablet, Rfl: 2   Semaglutide-Weight Management (WEGOVY) 1.7 MG/0.75ML SOAJ, INJECT 1.'7MG'$  ONCE A WEEK, Disp: 4 mL, Rfl: 2  Current Facility-Administered Medications:    0.9 %  sodium chloride infusion, , Intravenous, PRN, [name], FNP   cyanocobalamin (VITAMIN B12) injection 1,000 mcg, 1,000 mcg, Intramuscular, Once, [name], MD   Allergies  Allergen Reactions   Amoxicillin Hives    Has patient had a PCN reaction causing immediate rash, facial/tongue/throat swelling, SOB or lightheadedness with hypotension: Yes Has patient had a PCN reaction causing severe rash involving mucus membranes or skin necrosis: Yes Has patient had a PCN  reaction that required hospitalization No Has patient had a PCN reaction occurring within the last 10 years: No If all of the above answers are "NO", then may proceed with Cephalosporin use.    Milk-Related Compounds Diarrhea and Nausea And Vomiting    Stomach cramps   Yellow Dyes (Non-Tartrazine) Hives     Review of Systems  Constitutional:  Positive for fatigue.  HENT: Negative.    Eyes: Negative.   Respiratory: Negative.    Cardiovascular: Negative.  Negative for chest pain.  Gastrointestinal: Negative.      Today's Vitals   11/07/21 0905  BP: 128/68  Pulse: 77  Temp: 98.5 F (36.9 C)  Weight: 143 lb 6.4 oz (65 kg)  Height: '5\' 4"'$  (1.626 m)  PainSc: 0-No pain   Body mass index is 24.61 kg/m.  Wt Readings from Last 3 Encounters:  11/07/21 143 lb 6.4 oz (65 kg)  10/24/21 150 lb (68 kg)  05/16/21 150 lb 8 oz (68.3 kg)    Objective:  Physical Exam Vitals and nursing note reviewed.  Constitutional:      Appearance: Normal appearance.  HENT:     Head: Normocephalic and atraumatic.  Eyes:     Extraocular Movements: Extraocular movements intact.  Cardiovascular:     Rate and Rhythm: Normal rate and regular rhythm.     Heart sounds: Normal heart sounds.  Pulmonary:     Effort: Pulmonary effort is normal.     Breath sounds: Normal breath sounds.  Musculoskeletal:     Cervical back: Normal range of motion.  Skin:    General: Skin is warm.  Neurological:     General: No focal deficit present.     Mental Status: She is alert.  Psychiatric:        Mood and Affect: Mood normal.        Behavior: Behavior normal.       Assessment And Plan:     1. Hypercholesterolemia Comments: Chronic, not on meds. She has made some lifestyle changes. I will check labs today and make further recommendations once her labs are avail for review.  - Lipid panel  2. Functional diarrhea Comments: Chronic, intermittent. I will send rx Bentyl to use prn as requested.  - dicyclomine  (BENTYL) 20 MG tablet; Take 1 tablet (20 mg total) by mouth every 6 (six) hours. prn  Dispense: 90 tablet; Refill: 1  3. Post-COVID syndrome Comments: She is experiencing fatigue. She agrees to vitamin B12 IM x 1. She is encouraged to stay well hydrated, take melatonin nightly for up to 7 days.  - cyanocobalamin (VITAMIN B12) injection 1,000 mcg  4. Body mass index (BMI) of 24.0-24.9 in adult Comments: She has reached her goal weight. We discussed need  to start titration of Wegovy. Will send rx Wegovy 1.'7mg'$  weekly. F/u in 8 wks.   5. Screen for colon cancer Comments: She agrees to GI referral for CRC screening.  - Ambulatory referral to Gastroenterology  6. Immunization declined   Patient was given opportunity to ask questions. Patient verbalized understanding of the plan and was able to repeat key elements of the plan. All questions were answered to their satisfaction.   I, [name], MD, have reviewed all documentation for this visit. The documentation on 11/07/21 for the exam, diagnosis, procedures, and orders are all accurate and complete.   IF YOU HAVE BEEN REFERRED TO A SPECIALIST, IT MAY TAKE 1-2 WEEKS TO SCHEDULE/PROCESS THE REFERRAL. IF YOU HAVE NOT HEARD FROM US/SPECIALIST IN TWO WEEKS, PLEASE GIVE [place] A CALL AT [phone].   THE PATIENT IS ENCOURAGED TO PRACTICE SOCIAL DISTANCING DUE TO THE COVID-19 PANDEMIC.

## 2021-11-17 ENCOUNTER — Encounter (INDEPENDENT_AMBULATORY_CARE_PROVIDER_SITE_OTHER): Payer: BC Managed Care – PPO | Admitting: Ophthalmology

## 2021-11-17 DIAGNOSIS — H35063 Retinal vasculitis, bilateral: Secondary | ICD-10-CM

## 2021-11-17 DIAGNOSIS — H3020 Posterior cyclitis, unspecified eye: Secondary | ICD-10-CM

## 2021-11-17 DIAGNOSIS — H25813 Combined forms of age-related cataract, bilateral: Secondary | ICD-10-CM

## 2021-12-07 ENCOUNTER — Ambulatory Visit: Payer: BC Managed Care – PPO | Admitting: Neurology

## 2021-12-07 ENCOUNTER — Encounter: Payer: Self-pay | Admitting: Neurology

## 2021-12-07 VITALS — BP 131/76 | HR 70

## 2021-12-07 DIAGNOSIS — R569 Unspecified convulsions: Secondary | ICD-10-CM

## 2021-12-07 DIAGNOSIS — G43009 Migraine without aura, not intractable, without status migrainosus: Secondary | ICD-10-CM | POA: Diagnosis not present

## 2021-12-07 DIAGNOSIS — N399 Disorder of urinary system, unspecified: Secondary | ICD-10-CM

## 2021-12-07 DIAGNOSIS — Z79899 Other long term (current) drug therapy: Secondary | ICD-10-CM

## 2021-12-07 DIAGNOSIS — R208 Other disturbances of skin sensation: Secondary | ICD-10-CM

## 2021-12-07 DIAGNOSIS — G35 Multiple sclerosis: Secondary | ICD-10-CM

## 2021-12-07 DIAGNOSIS — G4700 Insomnia, unspecified: Secondary | ICD-10-CM

## 2021-12-07 MED ORDER — GABAPENTIN 300 MG PO CAPS: ORAL_CAPSULE | ORAL | 3 refills | Status: AC

## 2021-12-07 MED ORDER — TAMSULOSIN HCL 0.4 MG PO CAPS: 0.4000 mg | ORAL_CAPSULE | Freq: Every day | ORAL | 3 refills | Status: DC

## 2021-12-07 MED ORDER — DOXEPIN HCL 10 MG PO CAPS: 10.0000 mg | ORAL_CAPSULE | Freq: Every day | ORAL | 3 refills | Status: DC

## 2021-12-07 MED ORDER — EMGALITY 120 MG/ML ~~LOC~~ SOAJ: SUBCUTANEOUS | 3 refills | Status: DC

## 2021-12-07 MED ORDER — RIZATRIPTAN BENZOATE 10 MG PO TBDP: ORAL_TABLET | ORAL | 3 refills | Status: DC

## 2021-12-07 MED ORDER — AMPHETAMINE-DEXTROAMPHET ER 25 MG PO CP24: 25.0000 mg | ORAL_CAPSULE | Freq: Every day | ORAL | 0 refills | Status: DC

## 2021-12-07 NOTE — Progress Notes (Signed)
[facility]  PATIENT: Kelly Alvarado DOB: 1976-01-21  REFERRING DOCTOR OR PCP:  [name]  SOURCE: patient and records  _________________________________   HISTORICAL  CHIEF COMPLAINT:  Chief Complaint  Patient presents with   Follow-up    Pt in room #1 and alone. Pt here today for f/u on Ocrevus for her MS.     HISTORY OF PRESENT ILLNESS:  Kelly Alvarado is a 46 year old woman with relapsing remitting MS diagnosed in 2010 and h/o a seizure in 2017.  History of Present Illness: Update 12/07/2021: Her next Ocrevus infusion will be 06/22/2021     She denies any exacerbations.   However, she is feeling worse in general    She will be moving to [place], [place] and I gave her some names at [facility]  She has tingling and numbness in the right leg tat seems worse but notes her next infusion will be in a couple weeks and she often feels a little worse the last month.   Infusion will be 12/21/2021  Gait is doing ok but she feels it is a little slower due to pain.   She can go up and down stairs without the bannister.   She can walk > 3 miles.   No falls.   No clumsiness.   She has tingling in her arms.  Gabapentin helps the tingling.    She has mild left sided weakness.     She has had frequent UTIs and feels se does not completely empty.  Tamsulosin helped but she ran out  She has had fatigue and reduced focus/attention.   Adderall has helped.   She had less fatigue when she was on Tysabri.   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   She is on Ocrevus now and feels worse the last month of each cycle.   She is currently in the last month.  No LOC spells or seizures recently.  Only seizure was in 2017 a few days after hysterectomy.    Chronic migraines are doing much better on Emgality.   However, the last 2 weeks, she has had a daily HA upon awakening,   She takes Tylenol daily the past month.   She takes Tylenol and/or Maxalt  for breakthrough headaches   In past tried topiramate, zonisamide, nortriptyline and NSAIDs without much benefit.    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 MRI of the brain was consistent with multiple sclerosis.   She was  started on Rebif. The MS was well controlled but she felt poorly on that medication. She transferred to [facility] and began to see me in 2012. She was placed on Tysabri for about a year and felt much better while on it. Her MS did well on Tysabri as well.  Unfortunately, she was JCV positive and after about a year she stopped Tysabri and started Gilenya. She had done fairly well on Gilenya with no clinical exacerbations.  However, she switched to [facility] in August 2020 after MRI showed new lesions.    IMAGING MRI brain 07/01/2018 shows multiple T2/flair hyperintense foci in the spinal cord, left middle cerebellar peduncle and in the periventricular, juxtacortical and  deep white matter of the hemispheres.  These are consistent with chronic demyelinating plaque associated with multiple sclerosis.  None of the foci enhances or appears to be acute.  However, the 2 foci in the spinal cord and several of the foci in the hemispheres were not present on the 2012 MRI.   There is a normal enhancement pattern and no acute findings.  MRI cervical spine 07/01/2018 shows T2 hyperintense foci within the spinal cord adjacent to C1-C2, C3-C4, C5 and T3.  These are consistent with chronic demyelinating plaque associated with multiple sclerosis.  None of the foci enhanced on the current study.  However, none of the 4 foci were noted on the MRI from 2010     REVIEW OF SYSTEMS: Constitutional: No fevers, chills, sweats, or change in appetite.  She notes fatigue and  insomnia. Eyes: No double vision, eye pain.  She has right visula loss over last year.   Ear, nose and throat: No hearing loss, ear pain, nasal congestion, sore throat Cardiovascular: No chest pain, palpitations Respiratory:  No shortness of breath at rest or with exertion.   No wheezes GastrointestinaI: No nausea, vomiting, diarrhea, abdominal pain, fecal incontinence Genitourinary:  No dysuria, urinary retention or frequency.  No nocturia. Musculoskeletal:  No neck pain, back .  Notes myalgias at times Integumentary: No rash, pruritus, skin lesions Neurological: as above Psychiatric: No depression at this time.  No anxiety Endocrine: No palpitations, diaphoresis, change in appetite, change in weigh or increased thirst Hematologic/Lymphatic:  No anemia, purpura, petechiae. Allergic/Immunologic: No itchy/runny eyes, nasal congestion, recent allergic reactions, rashes.  She has some food allergies.  ALLERGIES: Allergies  Allergen Reactions   Amoxicillin Hives    Has patient had a PCN reaction causing immediate rash, facial/tongue/throat swelling, SOB or lightheadedness with hypotension: Yes Has patient had a PCN reaction causing severe rash involving mucus membranes or skin necrosis: Yes Has patient had a PCN reaction that required hospitalization No Has patient had a PCN reaction occurring within the last 10 years: No If all of the above answers are "NO", then may proceed with Cephalosporin use.    Milk-Related Compounds Diarrhea and Nausea And Vomiting    Stomach cramps   Yellow Dyes (Non-Tartrazine) Hives    HOME MEDICATIONS:  Current Outpatient Medications:    betamethasone dipropionate 0.05 % cream, Apply topically 2 (two) times daily., Disp: 30 g, Rfl: 2   dicyclomine (BENTYL) 20 MG tablet, Take 1 tablet (20 mg total) by mouth every 6 (six) hours. prn, Disp: 90 tablet, Rfl: 1   fluticasone (FLONASE) 50 MCG/ACT nasal spray, SPRAY 2 SPRAYS INTO EACH NOSTRIL EVERY DAY, Disp: 48  mL, Rfl: 3   HYDROcodone bit-homatropine (HYDROMET) 5-1.5 MG/5ML syrup, Take 5 mLs by mouth every 6 (six) hours as needed for cough., Disp: 120 mL, Rfl: 0   hydrocortisone cream 1 %, Apply to affected area 2 times daily, Disp: 30 g, Rfl: 1   levocetirizine (XYZAL) 5 MG tablet, TAKE 1 TABLET BY MOUTH ONCE DAILY IN THE EVENING, Disp: 90 tablet, Rfl: 2   ocrelizumab (OCREVUS) 300 MG/10ML injection, Inject 600 mg into the vein every 6 (six) months., Disp: , Rfl:    Semaglutide-Weight Management (WEGOVY) 1.7 MG/0.75ML SOAJ, INJECT 1.'7MG'$  ONCE A WEEK, Disp: 4 mL, Rfl: 2   tamsulosin (FLOMAX) 0.4 MG CAPS capsule, Take 1 capsule (0.4 mg total) by mouth daily., Disp: 90 capsule, Rfl: 3   Vitamin D, Ergocalciferol, (DRISDOL) 1.25 MG (50000 UNIT) CAPS capsule, Take 1 capsule (  50,000 Units total) by mouth 2 (two) times a week., Disp: 24 capsule, Rfl: 1   amphetamine-dextroamphetamine (ADDERALL XR) 25 MG 24 hr capsule, Take 1 capsule by mouth daily., Disp: 90 capsule, Rfl: 0   doxepin (SINEQUAN) 10 MG capsule, Take 1 capsule (10 mg total) by mouth at bedtime., Disp: 90 capsule, Rfl: 3   gabapentin (NEURONTIN) 300 MG capsule, TAKE 1 CAPSULE IN AM, AND 1 CAP IN EVENING, AND 2 AT BEDTIME, Disp: 360 capsule, Rfl: 3   Galcanezumab-gnlm (EMGALITY) 120 MG/ML SOAJ, INJECT 1 ML INTO THE SKIN ONCE EVERY 30 DAYS, Disp: 3 mL, Rfl: 3   rizatriptan (MAXALT-MLT) 10 MG disintegrating tablet, TAKE 1 TABLET BY MOUTH AS NEEDED FOR MIGRAINE. MAY REPEAT IN 2 HOURS IF NEEDED, Disp: 27 tablet, Rfl: 3  Current Facility-Administered Medications:    0.9 %  sodium chloride infusion, , Intravenous, PRN, [name], FNP  PAST MEDICAL HISTORY: Past Medical History:  Diagnosis Date   Allergy    Anemia    HEAVY PERIODS   Headache(784.0)    MIGRAINES   Multiple sclerosis ([place])    Neuromuscular disorder ([place])    MS   Pulmonary embolism ([place]) 2013   lt-?bcp   Wears glasses     PAST SURGICAL HISTORY: Past Surgical History:   Procedure Laterality Date   ABDOMINAL HYSTERECTOMY     BILATERAL SALPINGECTOMY Bilateral 12/27/2015   Procedure: BILATERAL SALPINGECTOMY;  Surgeon: Everett Graff, MD;  Location: [facility];  Service: Gynecology;  Laterality: Bilateral;  Excision of paratubal cyst   BREAST LUMPECTOMY WITH RADIOACTIVE SEED LOCALIZATION Left 10/06/2013   Procedure:  RADIOACTIVE SEED LOCALIZATION LEFT BREAST LUMPECTOMY;  Surgeon: Odis Hollingshead, MD;  Location: [facility];  Service: General;  Laterality: Left;   BREAST REDUCTION SURGERY     BREAST SURGERY     CYSTOSCOPY N/A 12/27/2015   Procedure: CYSTOSCOPY;  Surgeon: Everett Graff, MD;  Location: [facility];  Service: Gynecology;  Laterality: N/A;   REPAIR VAGINAL CUFF N/A 01/09/2016   Procedure: REPAIR VAGINAL CUFF;  Surgeon: Everett Graff, MD;  Location: [facility];  Service: Gynecology;  Laterality: N/A;   VAGINAL HYSTERECTOMY Bilateral 12/27/2015   Procedure: HYSTERECTOMY VAGINAL;  Surgeon: Everett Graff, MD;  Location: [facility];  Service: Gynecology;  Laterality: Bilateral;   WISDOM TOOTH EXTRACTION      FAMILY HISTORY: Family History  Problem Relation Age of Onset   Asthma Mother    Hypertension Father    Cancer Paternal Uncle    Cancer Paternal Grandfather        DIAGNOSTIC DATA (LABS, IMAGING, TESTING) - I reviewed patient records, labs, notes, testing and imaging myself where available.  Lab Results  Component Value Date   WBC 5.9 05/16/2021   HGB 12.9 05/16/2021   HCT 38.0 05/16/2021   MCV 93 05/16/2021   PLT 305 05/16/2021   ______________________________________________________________________________ Physical Exam    Today's Vitals   12/07/21 0820  BP: 131/76  Pulse: 70    There is no height or weight on file to calculate BMI.    General: The patient is well-developed and well-nourished and in no acute distress   Musculoskeletal:  Neck has good ROM and is nontender     Neurologic Exam   Mental status:  The patient is alert and oriented x 3 at the time of the examination. The patient has apparent normal recent and remote memory, with an apparently normal attention span and concentration ability.   Speech is normal.  Cranial nerves: Extraocular movements are full.  Facial strength and sensation are normal.     No obvious hearing deficits are noted.   Motor:  Muscle bulk is normal.   Tone is normal. Strength is  5 / 5 in all 4 extremities.    Sensory: He has intact sensation to touch and vibration in the arms and legs.   Coordination: Cerebellar testing reveals good finger-nose-finger and heel-to-shin bilaterally.   Gait and station: Station is normal.   Gait was fairly normal but tandem gait is wide.. Romberg is negative.    Reflexes: Deep tendon reflexes are symmetric and normal bilaterally    ASSESSMENT AND PLAN    1. Multiple sclerosis ([place])   2. High risk medication use   3. Common migraine without intractability   4. Dysesthesia   5. Insomnia, unspecified type   6. Convulsions, unspecified convulsion type ([place])   7. Urinary disorder       1.   Continue Ocrevus.  Check IgG/IgM and CBCD.  We will check an MRI of the brain with and cervical spine compared to her previous one.  A different disease modifying therapy may be necessary if she has significant progression/breakthrough..   2.   She will be moving to [facility] at the end of the year.  I gave her the name of several MS specialist in [place] at [place].  I also advised her to call the [facility] to see if there is an [place] specialist that would be closer to her.. 3.   Continue Emgality for chronic migraine   Maxalt or Tylenol for breakthrough.  Continue low-dose doxepin for insomnia.  Tamsulosin for urinary hesitancy 4. .  She continues to be seizure-free medication. 5.    She will return as needed if there are new or worsening neurologic symptoms.  If she returns to the area I would be happy to see her again  for her MS.  40 minutes office visit with the majority of the time spent face-to-face for history and physical, discussion/counseling and decision-making.  Additional time with record review and documentation.   Carrel Leather A. Felecia Shelling, MD, PhD, FAAN Certified in Neurology, Clinical Neurophysiology, Sleep Medicine, Pain Medicine and Neuroimaging Director, [place] at [facility] [address], [place], [place] [address] [phone]

## 2021-12-08 LAB — CBC WITH DIFFERENTIAL/PLATELET
Basophils Absolute: 0 10*3/uL (ref 0.0–0.2)
Basos: 1 %
EOS (ABSOLUTE): 0 10*3/uL (ref 0.0–0.4)
Eos: 1 %
Hematocrit: 34 % (ref 34.0–46.6)
Hemoglobin: 11.6 g/dL (ref 11.1–15.9)
Immature Grans (Abs): 0 10*3/uL (ref 0.0–0.1)
Immature Granulocytes: 0 %
Lymphocytes Absolute: 0.7 10*3/uL (ref 0.7–3.1)
Lymphs: 20 %
MCH: 31.2 pg (ref 26.6–33.0)
MCHC: 34.1 g/dL (ref 31.5–35.7)
MCV: 91 fL (ref 79–97)
Monocytes Absolute: 0.2 10*3/uL (ref 0.1–0.9)
Monocytes: 6 %
Neutrophils Absolute: 2.6 10*3/uL (ref 1.4–7.0)
Neutrophils: 72 %
Platelets: 310 10*3/uL (ref 150–450)
RBC: 3.72 x10E6/uL — ABNORMAL LOW (ref 3.77–5.28)
RDW: 12.5 % (ref 11.7–15.4)
WBC: 3.7 10*3/uL (ref 3.4–10.8)

## 2021-12-08 LAB — IGG, IGA, IGM
IgA/Immunoglobulin A, Serum: 90 mg/dL (ref 87–352)
IgG (Immunoglobin G), Serum: 949 mg/dL (ref 586–1602)
IgM (Immunoglobulin M), Srm: 34 mg/dL (ref 26–217)

## 2021-12-08 LAB — CD20 B CELLS
% CD19-B Cells: 0 % — ABNORMAL LOW (ref 4.6–22.1)
% CD20-B Cells: 0 % — ABNORMAL LOW (ref 5.0–22.3)

## 2021-12-12 ENCOUNTER — Ambulatory Visit (INDEPENDENT_AMBULATORY_CARE_PROVIDER_SITE_OTHER): Payer: BC Managed Care – PPO

## 2021-12-12 ENCOUNTER — Ambulatory Visit: Payer: BC Managed Care – PPO

## 2021-12-12 VITALS — BP 118/62 | HR 69 | Temp 98.5°F

## 2021-12-12 DIAGNOSIS — E538 Deficiency of other specified B group vitamins: Secondary | ICD-10-CM

## 2021-12-12 DIAGNOSIS — G35 Multiple sclerosis: Secondary | ICD-10-CM

## 2021-12-12 MED ORDER — CYANOCOBALAMIN 1000 MCG/ML IJ SOLN
1000.0000 ug | Freq: Once | INTRAMUSCULAR | Status: AC
  Administered 2021-12-12: 1000 ug via INTRAMUSCULAR

## 2021-12-12 MED ORDER — GADOBENATE DIMEGLUMINE 529 MG/ML IV SOLN
15.0000 mL | Freq: Once | INTRAVENOUS | Status: AC | PRN
  Administered 2021-12-12: 15 mL via INTRAVENOUS

## 2021-12-12 NOTE — Patient Instructions (Signed)
Vitamin B12 Deficiency Vitamin B12 deficiency means that your body does not have enough vitamin B12. The body needs this important vitamin: To make red blood cells. To make genes (DNA). To help the nerves work. If you do not have enough vitamin B12 in your body, you can have health problems, such as not having enough red blood cells in the blood (anemia). What are the causes? Not eating enough foods that contain vitamin B12. Not being able to take in (absorb) vitamin B12 from the food that you eat. Certain diseases. A condition in which the body does not make enough of a certain protein. This results in your body not taking in enough vitamin B12. Having a surgery in which part of the stomach or small intestine is taken out. Taking medicines that make it hard for the body to take in vitamin B12. These include: Heartburn medicines. Some medicines that are used to treat diabetes. What increases the risk? Being an older adult. Eating a vegetarian or vegan diet that does not include any foods that come from animals. Not eating enough foods that contain vitamin B12 while you are pregnant. Taking certain medicines. Having alcoholism. What are the signs or symptoms? In some cases, there are no symptoms. If the condition leads to too few blood cells or nerve damage, symptoms can occur, such as: Feeling weak or tired. Not being hungry. Losing feeling (numbness) or tingling in your hands and feet. Redness and burning of the tongue. Feeling sad (depressed). Confusion or memory problems. Trouble walking. If anemia is very bad, symptoms can include: Being short of breath. Being dizzy. Having a very fast heartbeat. How is this treated? Changing the way you eat and drink, such as: Eating more foods that contain vitamin B12. Drinking little or no alcohol. Getting vitamin B12 shots. Taking vitamin B12 supplements by mouth (orally). Your doctor will tell you the dose that is best for you. Follow  these instructions at home: Eating and drinking  Eat foods that come from animals and have a lot of vitamin B12 in them. These include: Meats and poultry. This includes beef, pork, chicken, turkey, and organ meats, such as liver. Seafood, such as clams, rainbow trout, salmon, tuna, and haddock. Eggs. Dairy foods such as milk, yogurt, and cheese. Eat breakfast cereals that have vitamin B12 added to them (are fortified). Check the label. The items listed above may not be a complete list of foods and beverages you can eat and drink. Contact a dietitian for more information. Alcohol use Do not drink alcohol if: Your doctor tells you not to drink. You are pregnant, may be pregnant, or are planning to become pregnant. If you drink alcohol: Limit how much you have to: 0-1 drink a day for women. 0-2 drinks a day for men. Know how much alcohol is in your drink. In the [place] one drink equals one 12 oz bottle of beer (355 mL), one 5 oz glass of wine (148 mL), or one 1 oz glass of hard liquor (44 mL). General instructions Get any vitamin B12 shots if told by your doctor. Take supplements only as told by your doctor. Follow the directions. Keep all follow-up visits. Contact a doctor if: Your symptoms come back. Your symptoms get worse or do not get better with treatment. Get help right away if: You have trouble breathing. You have a very fast heartbeat. You have chest pain. You get dizzy. You faint. These symptoms may be an emergency. Get help right away. Call 911.   Do not wait to see if the symptoms will go away. Do not drive yourself to the hospital. Summary Vitamin B12 deficiency means that your body is not getting enough of the vitamin. In some cases, there are no symptoms of this condition. Treatment may include making a change in the way you eat and drink, getting shots, or taking supplements. Eat foods that have vitamin B12 in them. This information is not intended to replace advice  given to you by your health care provider. Make sure you discuss any questions you have with your health care provider. Document Revised: 09/16/2020 Document Reviewed: 09/16/2020 Elsevier Patient Education  2023 [employer] Inc.  

## 2021-12-12 NOTE — Progress Notes (Signed)
Patient presents today for b12 injection.

## 2021-12-26 ENCOUNTER — Other Ambulatory Visit: Payer: BC Managed Care – PPO

## 2021-12-26 DIAGNOSIS — Z111 Encounter for screening for respiratory tuberculosis: Secondary | ICD-10-CM

## 2021-12-29 LAB — QUANTIFERON-TB GOLD PLUS
QuantiFERON Mitogen Value: >10 IU/mL
QuantiFERON Nil Value: 0 IU/mL
QuantiFERON TB1 Ag Value: 0 IU/mL
QuantiFERON TB2 Ag Value: 0 IU/mL
QuantiFERON-TB Gold Plus: NEGATIVE

## 2022-01-01 ENCOUNTER — Encounter: Payer: Self-pay | Admitting: Nurse Practitioner

## 2022-01-16 ENCOUNTER — Telehealth (INDEPENDENT_AMBULATORY_CARE_PROVIDER_SITE_OTHER): Payer: BC Managed Care – PPO | Admitting: Internal Medicine

## 2022-01-16 ENCOUNTER — Encounter: Payer: Self-pay | Admitting: Internal Medicine

## 2022-01-16 DIAGNOSIS — K591 Functional diarrhea: Secondary | ICD-10-CM

## 2022-01-16 DIAGNOSIS — L219 Seborrheic dermatitis, unspecified: Secondary | ICD-10-CM | POA: Diagnosis not present

## 2022-01-16 DIAGNOSIS — Z6828 Body mass index (BMI) 28.0-28.9, adult: Secondary | ICD-10-CM

## 2022-01-16 DIAGNOSIS — F4322 Adjustment disorder with anxiety: Secondary | ICD-10-CM | POA: Diagnosis not present

## 2022-01-16 DIAGNOSIS — G35 Multiple sclerosis: Secondary | ICD-10-CM

## 2022-01-16 MED ORDER — BETAMETHASONE DIPROPIONATE 0.05 % EX CREA: TOPICAL_CREAM | Freq: Two times a day (BID) | CUTANEOUS | 2 refills | Status: DC

## 2022-01-16 NOTE — Progress Notes (Signed)
Virtual Visit via Video   This visit type was conducted due to national recommendations for restrictions regarding the COVID-19 Pandemic (e.g. social distancing) in an effort to limit this patient's exposure and mitigate transmission in our community.  Due to her co-morbid illnesses, this patient is at least at moderate risk for complications without adequate follow up.  This format is felt to be most appropriate for this patient at this time.  All issues noted in this document were discussed and addressed.  A limited physical exam was performed with this format.    This visit type was conducted due to national recommendations for restrictions regarding the COVID-19 Pandemic (e.g. social distancing) in an effort to limit this patient's exposure and mitigate transmission in our community.  Patients identity confirmed using two different identifiers.  This format is felt to be most appropriate for this patient at this time.  All issues noted in this document were discussed and addressed.  No physical exam was performed (except for noted visual exam findings with Video Visits).    Date:  01/20/2022   ID:  Kelly Alvarado, DOB 12/19/1975, MRN [id number]  Patient Location:  Home  Provider location:   Office    Chief Complaint:  "I have a weight check"  History of Present Illness:    Kelly Alvarado is a 46 y.o. female who presents via video conferencing for a telehealth visit today.    The patient does not have symptoms concerning for COVID-19 infection (fever, chills, cough, or new shortness of breath).   She presents today for virtual visit. She prefers this method of contact due to COVID-19 pandemic.  She presents today for a weight visit, she has been taking Wegovy without any issues. She is currently working in [place], [place] for the upcoming year(she still has primary residence in [place]). She is still getting accustomed to her new environment - both personally and professionally. She admits  she has not been exercising recently.        Past Medical History:  Diagnosis Date   Allergy    Anemia    HEAVY PERIODS   Headache(784.0)    MIGRAINES   Multiple sclerosis ([place])    Neuromuscular disorder ([place])    MS   Pulmonary embolism ([facility]) 2013   lt-?bcp   Wears glasses    Past Surgical History:  Procedure Laterality Date   ABDOMINAL HYSTERECTOMY     BILATERAL SALPINGECTOMY Bilateral 12/27/2015   Procedure: BILATERAL SALPINGECTOMY;  Surgeon: Everett Graff, MD;  Location: [facility];  Service: Gynecology;  Laterality: Bilateral;  Excision of paratubal cyst   BREAST LUMPECTOMY WITH RADIOACTIVE SEED LOCALIZATION Left 10/06/2013   Procedure:  RADIOACTIVE SEED LOCALIZATION LEFT BREAST LUMPECTOMY;  Surgeon: Odis Hollingshead, MD;  Location: [facility];  Service: General;  Laterality: Left;   BREAST REDUCTION SURGERY     BREAST SURGERY     CYSTOSCOPY N/A 12/27/2015   Procedure: CYSTOSCOPY;  Surgeon: Everett Graff, MD;  Location: [facility];  Service: Gynecology;  Laterality: N/A;   REPAIR VAGINAL CUFF N/A 01/09/2016   Procedure: REPAIR VAGINAL CUFF;  Surgeon: Everett Graff, MD;  Location: [facility];  Service: Gynecology;  Laterality: N/A;   VAGINAL HYSTERECTOMY Bilateral 12/27/2015   Procedure: HYSTERECTOMY VAGINAL;  Surgeon: Everett Graff, MD;  Location: [facility];  Service: Gynecology;  Laterality: Bilateral;   WISDOM TOOTH EXTRACTION       Current Meds  Medication Sig   amphetamine-dextroamphetamine (ADDERALL XR) 25 MG 24 hr  capsule Take 1 capsule by mouth daily.   dicyclomine (BENTYL) 20 MG tablet Take 1 tablet (20 mg total) by mouth every 6 (six) hours. prn   doxepin (SINEQUAN) 10 MG capsule Take 1 capsule (10 mg total) by mouth at bedtime.   fluticasone (FLONASE) 50 MCG/ACT nasal spray SPRAY 2 SPRAYS INTO EACH NOSTRIL EVERY DAY   gabapentin (NEURONTIN) 300 MG capsule TAKE 1 CAPSULE IN AM, AND 1 CAP IN EVENING, AND 2 AT BEDTIME   Galcanezumab-gnlm (EMGALITY) 120  MG/ML SOAJ INJECT 1 ML INTO THE SKIN ONCE EVERY 30 DAYS   HYDROcodone bit-homatropine (HYDROMET) 5-1.5 MG/5ML syrup Take 5 mLs by mouth every 6 (six) hours as needed for cough.   hydrocortisone cream 1 % Apply to affected area 2 times daily   levocetirizine (XYZAL) 5 MG tablet TAKE 1 TABLET BY MOUTH ONCE DAILY IN THE EVENING   ocrelizumab (OCREVUS) 300 MG/10ML injection Inject 600 mg into the vein every 6 (six) months.   rizatriptan (MAXALT-MLT) 10 MG disintegrating tablet TAKE 1 TABLET BY MOUTH AS NEEDED FOR MIGRAINE. MAY REPEAT IN 2 HOURS IF NEEDED   Semaglutide-Weight Management (WEGOVY) 1.7 MG/0.75ML SOAJ INJECT 1.'7MG'$  ONCE A WEEK   tamsulosin (FLOMAX) 0.4 MG CAPS capsule Take 1 capsule (0.4 mg total) by mouth daily.   Vitamin D, Ergocalciferol, (DRISDOL) 1.25 MG (50000 UNIT) CAPS capsule Take 1 capsule (50,000 Units total) by mouth 2 (two) times a week.   [DISCONTINUED] betamethasone dipropionate 0.05 % cream Apply topically 2 (two) times daily.   Current Facility-Administered Medications for the 01/16/22 encounter (Video Visit) with [name], MD  Medication   0.9 %  sodium chloride infusion     Allergies:   Amoxicillin, Milk-related compounds, and Yellow dyes (non-tartrazine)   Social History   Tobacco Use   Smoking status: Never   Smokeless tobacco: Never  Vaping Use   Vaping Use: Never used  Substance Use Topics   Alcohol use: Not Currently   Drug use: No     Family Hx: The patient's family history includes Asthma in her mother; Cancer in her paternal grandfather and paternal uncle; Hypertension in her father.  ROS:   Please see the history of present illness.    Review of Systems  Constitutional: Negative.   Respiratory: Negative.    Cardiovascular: Negative.   Gastrointestinal:  Positive for diarrhea.  Neurological: Negative.   Psychiatric/Behavioral:  The patient is nervous/anxious.     All other systems reviewed and are negative.   Labs/Other Tests and  Data Reviewed:    Recent Labs: 05/04/2021: ALT 6; BUN 7; Creatinine, Ser 0.83; Potassium 3.7; Sodium 140; TSH 2.690 12/07/2021: Hemoglobin 11.6; Platelets 310   Recent Lipid Panel Lab Results  Component Value Date/Time   CHOL 186 11/07/2021 10:02 AM   TRIG 85 11/07/2021 10:02 AM   HDL 70 11/07/2021 10:02 AM   CHOLHDL 2.7 11/07/2021 10:02 AM   CHOLHDL 2.8 10/11/2008 03:30 AM   LDLCALC 101 (H) 11/07/2021 10:02 AM    Wt Readings from Last 3 Encounters:  11/07/21 143 lb 6.4 oz (65 kg)  10/24/21 150 lb (68 kg)  05/16/21 150 lb 8 oz (68.3 kg)       Exam:    Vital Signs:  LMP 12/11/2015 (Approximate)     Physical Exam Vitals and nursing note reviewed.  Pulmonary:     Effort: Pulmonary effort is normal.  Neurological:     Mental Status: She is oriented to person, place, and time.  Psychiatric:  Mood and Affect: Affect normal.     ASSESSMENT & PLAN:    1. BMI 28.0-28.9,adult Comments: She will c/w Wegovy 2.'4mg'$  at this time. However, since she has reached her goal weight, we should start titrating the medication.  2. Adjustment disorder with anxious mood Comments: She would likely benefit from therapy to help with the transitioning process. She may have EAP offered at her new institution.  3. Functional diarrhea Comments: Likely related to anxiety. May benefit from L-theanine to decrease her sx of anxiety. May also benefit from IB-Gard.  4. Seborrheic dermatitis Comments: I will send refill of betamethasone as requested.  5. Multiple sclerosis ([place]) Comments: Chronic, encouraged to establish care with local neurologist. She would also likely benefit from PCP in her area since [place] have [place] license.    COVID-19 Education: The signs and symptoms of COVID-19 were discussed with the patient and how to seek care for testing (follow up with PCP or arrange E-visit).  The importance of social distancing was discussed today.  Patient Risk:   After full review of  this patients clinical status, I feel that they are at least moderate risk at this time.  Time:   Today, I have spent 11 minutes/ seconds with the patient with telehealth technology discussing above diagnoses.  She had difficulty in connecting to video, so this was converted to a phone visit.    Medication Adjustments/Labs and Tests Ordered: Current medicines are reviewed at length with the patient today.  Concerns regarding medicines are outlined above.   Tests Ordered: No orders of the defined types were placed in this encounter.   Medication Changes: Meds ordered this encounter  Medications   betamethasone dipropionate 0.05 % cream    Sig: Apply topically 2 (two) times daily.    Dispense:  30 g    Refill:  2    Disposition:  Follow up in 10 week(s)  Signed, [name], MD

## 2022-01-16 NOTE — Patient Instructions (Addendum)
L-theanine  Exercising to Lose Weight Getting regular exercise is important for everyone. It is especially important if you are overweight. Being overweight increases your risk of heart disease, stroke, diabetes, high blood pressure, and several types of cancer. Exercising, and reducing the calories you consume, can help you lose weight and improve fitness and health. Exercise can be moderate or vigorous intensity. To lose weight, most people need to do a certain amount of moderate or vigorous-intensity exercise each week. How can exercise affect me? You lose weight when you exercise enough to burn more calories than you eat. Exercise also reduces body fat and builds muscle. The more muscle you have, the more calories you burn. Exercise also: Improves mood. Reduces stress and tension. Improves your overall fitness, flexibility, and endurance. Increases bone strength. Moderate-intensity exercise  Moderate-intensity exercise is any activity that gets you moving enough to burn at least three times more energy (calories) than if you were sitting. Examples of moderate exercise include: Walking a mile in 15 minutes. Doing light yard work. Biking at an easy pace. Most people should get at least 150 minutes of moderate-intensity exercise a week to maintain their body weight. Vigorous-intensity exercise Vigorous-intensity exercise is any activity that gets you moving enough to burn at least six times more calories than if you were sitting. When you exercise at this intensity, you should be working hard enough that you are not able to carry on a conversation. Examples of vigorous exercise include: Running. Playing a team sport, such as football, basketball, and soccer. Jumping rope. Most people should get at least 75 minutes a week of vigorous exercise to maintain their body weight. What actions can I take to lose weight? The amount of exercise you need to lose weight depends on: Your age. The  type of exercise. Any health conditions you have. Your overall physical ability. Talk to your health care provider about how much exercise you need and what types of activities are safe for you. Nutrition  Make changes to your diet as told by your health care provider or diet and nutrition specialist (dietitian). This may include: Eating fewer calories. Eating more protein. Eating less unhealthy fats. Eating a diet that includes fresh fruits and vegetables, whole grains, low-fat dairy products, and lean protein. Avoiding foods with added fat, salt, and sugar. Drink plenty of water while you exercise to prevent dehydration or heat stroke. Activity Choose an activity that you enjoy and set realistic goals. Your health care provider can help you make an exercise plan that works for you. Exercise at a moderate or vigorous intensity most days of the week. The intensity of exercise may vary from person to person. You can tell how intense a workout is for you by paying attention to your breathing and heartbeat. Most people will notice their breathing and heartbeat get faster with more intense exercise. Do resistance training twice each week, such as: Push-ups. Sit-ups. Lifting weights. Using resistance bands. Getting short amounts of exercise can be just as helpful as long, structured periods of exercise. If you have trouble finding time to exercise, try doing these things as part of your daily routine: Get up, stretch, and walk around every 30 minutes throughout the day. Go for a walk during your lunch break. Park your car farther away from your destination. If you take public transportation, get off one stop early and walk the rest of the way. Make phone calls while standing up and walking around. Take the stairs instead of elevators  or escalators. Wear comfortable clothes and shoes with good support. Do not exercise so much that you hurt yourself, feel dizzy, or get very short of  breath. Where to find more information U.S. Department of Health and Human Services: [employer]at Centers for Disease Control and Prevention: http://www.wolf.info/ Contact a health care provider: Before starting a new exercise program. If you have questions or concerns about your weight. If you have a medical problem that keeps you from exercising. Get help right away if: You have any of the following while exercising: Injury. Dizziness. Difficulty breathing or shortness of breath that does not go away when you stop exercising. Chest pain. Rapid heartbeat. These symptoms may represent a serious problem that is an emergency. Do not wait to see if the symptoms will go away. Get medical help right away. Call your local emergency services (911 in the [place].S.). Do not drive yourself to the hospital. Summary Getting regular exercise is especially important if you are overweight. Being overweight increases your risk of heart disease, stroke, diabetes, high blood pressure, and several types of cancer. Losing weight happens when you burn more calories than you eat. Reducing the amount of calories you eat, and getting regular moderate or vigorous exercise each week, helps you lose weight. This information is not intended to replace advice given to you by your health care provider. Make sure you discuss any questions you have with your health care provider. Document Revised: 03/20/2020 Document Reviewed: 03/20/2020 Elsevier Patient Education  [place].

## 2022-02-02 ENCOUNTER — Encounter: Payer: Self-pay | Admitting: Internal Medicine

## 2022-02-05 LAB — COLOGUARD

## 2022-02-11 ENCOUNTER — Encounter: Payer: Self-pay | Admitting: Neurology

## 2022-02-18 ENCOUNTER — Encounter: Payer: Self-pay | Admitting: Internal Medicine

## 2022-02-22 ENCOUNTER — Other Ambulatory Visit: Payer: Self-pay | Admitting: Internal Medicine

## 2022-02-22 LAB — COLOGUARD

## 2022-03-10 LAB — EXTERNAL GENERIC LAB PROCEDURE: COLOGUARD: NEGATIVE

## 2022-03-10 LAB — COLOGUARD: COLOGUARD: NEGATIVE

## 2022-05-28 ENCOUNTER — Encounter: Payer: BC Managed Care – PPO | Admitting: Internal Medicine

## 2022-05-28 NOTE — Progress Notes (Deleted)
Subjective:     Patient ID: Kelly Alvarado , female    DOB: August 04, 1975 , 47 y.o.   MRN: [id number]   Chief Complaint  Patient presents with   Annual Exam    HPI  Patient is here for physical exam. She is followed by Dr Su Hilt at [facility] for her GYN care. She sees a neurologist every 6 months for her MS. She feels like she is having an MS exaceration having brain fog, hand pain      Leg Pain  The incident occurred more than 1 week ago. There was no injury mechanism. The pain is present in the left leg and left thigh. The quality of the pain is described as aching. The pain is mild. Pertinent negatives include no numbness or tingling. The symptoms are aggravated by movement (unable to sleep on her left side at night due to pain).     Past Medical History:  Diagnosis Date   Allergy    Anemia    HEAVY PERIODS   Headache(784.0)    MIGRAINES   Multiple sclerosis ([facility])    Neuromuscular disorder ([facility])    MS   Pulmonary embolism ([facility]) 2013   lt-?bcp   Wears glasses      Family History  Problem Relation Age of Onset   Asthma Mother    Hypertension Father    Cancer Paternal Uncle    Cancer Paternal Grandfather      Current Outpatient Medications:    amphetamine-dextroamphetamine (ADDERALL XR) 25 MG 24 hr capsule, Take 1 capsule by mouth daily., Disp: 90 capsule, Rfl: 0   betamethasone dipropionate 0.05 % cream, Apply topically 2 (two) times daily., Disp: 30 g, Rfl: 2   dicyclomine (BENTYL) 20 MG tablet, Take 1 tablet (20 mg total) by mouth every 6 (six) hours. prn, Disp: 90 tablet, Rfl: 1   doxepin (SINEQUAN) 10 MG capsule, Take 1 capsule (10 mg total) by mouth at bedtime., Disp: 90 capsule, Rfl: 3   fluticasone (FLONASE) 50 MCG/ACT nasal spray, SPRAY 2 SPRAYS INTO EACH NOSTRIL EVERY DAY, Disp: 48 mL, Rfl: 3   gabapentin (NEURONTIN) 300 MG capsule, TAKE 1 CAPSULE IN AM, AND 1 CAP IN EVENING, AND 2 AT BEDTIME, Disp: 360 capsule, Rfl: 3   Galcanezumab-gnlm (EMGALITY) 120 MG/ML  SOAJ, INJECT 1 ML INTO THE SKIN ONCE EVERY 30 DAYS, Disp: 3 mL, Rfl: 3   HYDROcodone bit-homatropine (HYDROMET) 5-1.5 MG/5ML syrup, Take 5 mLs by mouth every 6 (six) hours as needed for cough., Disp: 120 mL, Rfl: 0   levocetirizine (XYZAL) 5 MG tablet, TAKE 1 TABLET BY MOUTH ONCE DAILY IN THE EVENING, Disp: 90 tablet, Rfl: 2   ocrelizumab (OCREVUS) 300 MG/10ML injection, Inject 600 mg into the vein every 6 (six) months., Disp: , Rfl:    rizatriptan (MAXALT-MLT) 10 MG disintegrating tablet, TAKE 1 TABLET BY MOUTH AS NEEDED FOR MIGRAINE. MAY REPEAT IN 2 HOURS IF NEEDED, Disp: 27 tablet, Rfl: 3   Semaglutide-Weight Management (WEGOVY) 1.7 MG/0.75ML SOAJ, INJECT 1.7 MG  INTO THE SKIN ONCE A WEEK, Disp: 4 mL, Rfl: 0   tamsulosin (FLOMAX) 0.4 MG CAPS capsule, Take 1 capsule (0.4 mg total) by mouth daily., Disp: 90 capsule, Rfl: 3   Vitamin D, Ergocalciferol, (DRISDOL) 1.25 MG (50000 UNIT) CAPS capsule, Take 1 capsule (50,000 Units total) by mouth 2 (two) times a week., Disp: 24 capsule, Rfl: 1  Current Facility-Administered Medications:    0.9 %  sodium chloride infusion, , Intravenous, PRN, Christell Constant,  Lolita Cram, FNP   Allergies  Allergen Reactions   Amoxicillin Hives    Has patient had a PCN reaction causing immediate rash, facial/tongue/throat swelling, SOB or lightheadedness with hypotension: Yes Has patient had a PCN reaction causing severe rash involving mucus membranes or skin necrosis: Yes Has patient had a PCN reaction that required hospitalization No Has patient had a PCN reaction occurring within the last 10 years: No If all of the above answers are "NO", then may proceed with Cephalosporin use.    Milk-Related Compounds Diarrhea and Nausea And Vomiting    Stomach cramps   Yellow Dyes (Non-Tartrazine) Hives      The patient states she uses {contraceptive methods:5051} for birth control. Last LMP was Patient's last menstrual period was 12/11/2015 (approximate)..  {Dysmenorrhea-menorrhagia:21918}. Negative for: breast discharge, breast lump(s), breast pain and breast self exam. Associated symptoms include abnormal vaginal bleeding. Pertinent negatives include abnormal bleeding (hematology), anxiety, decreased libido, depression, difficulty falling sleep, dyspareunia, history of infertility, nocturia, sexual dysfunction, sleep disturbances, urinary incontinence, urinary urgency, vaginal discharge and vaginal itching. Diet regular.The patient states her exercise level is    . The patient's tobacco use is:  Social History   Tobacco Use  Smoking Status Never  Smokeless Tobacco Never  . She has been exposed to passive smoke. The patient's alcohol use is:  Social History   Substance and Sexual Activity  Alcohol Use Not Currently  . Additional information: Last pap ***, next one scheduled for ***.    Review of Systems  Constitutional: Negative.   HENT: Negative.    Eyes: Negative.   Respiratory: Negative.    Cardiovascular: Negative.   Gastrointestinal: Negative.   Endocrine: Negative.   Genitourinary: Negative.   Musculoskeletal: Negative.   Skin: Negative.   Allergic/Immunologic: Negative.   Neurological: Negative.  Negative for tingling and numbness.  Hematological: Negative.   Psychiatric/Behavioral: Negative.       There were no vitals filed for this visit. There is no height or weight on file to calculate BMI.   Objective:  Physical Exam      Assessment And Plan:     1. Encounter for annual physical exam  2. Hypercholesterolemia  3. Adjustment disorder with anxious mood     Patient was given opportunity to ask questions. Patient verbalized understanding of the plan and was able to repeat key elements of the plan. All questions were answered to their satisfaction.   Coolidge Breeze, CMA   I, Coolidge Breeze, CMA, have reviewed all documentation for this visit. The documentation on 05/28/22 for the exam, diagnosis,  procedures, and orders are all accurate and complete.   THE PATIENT IS ENCOURAGED TO PRACTICE SOCIAL DISTANCING DUE TO THE COVID-19 PANDEMIC.

## 2022-06-02 NOTE — Progress Notes (Signed)
No show  This encounter was created in error - please disregard.

## 2022-12-19 LAB — HM MAMMOGRAPHY

## 2022-12-21 ENCOUNTER — Encounter: Payer: Self-pay | Admitting: Internal Medicine

## 2022-12-25 ENCOUNTER — Ambulatory Visit: Payer: BC Managed Care – PPO | Admitting: Nurse Practitioner

## 2022-12-25 ENCOUNTER — Encounter: Payer: Self-pay | Admitting: Nurse Practitioner

## 2022-12-25 VITALS — BP 100/60 | HR 66 | Temp 97.8°F | Ht 64.0 in | Wt 165.4 lb

## 2022-12-25 DIAGNOSIS — E7800 Pure hypercholesterolemia, unspecified: Secondary | ICD-10-CM

## 2022-12-25 DIAGNOSIS — Z6828 Body mass index (BMI) 28.0-28.9, adult: Secondary | ICD-10-CM

## 2022-12-25 DIAGNOSIS — E538 Deficiency of other specified B group vitamins: Secondary | ICD-10-CM

## 2022-12-25 DIAGNOSIS — K591 Functional diarrhea: Secondary | ICD-10-CM

## 2022-12-25 DIAGNOSIS — E559 Vitamin D deficiency, unspecified: Secondary | ICD-10-CM | POA: Diagnosis not present

## 2022-12-25 DIAGNOSIS — G35 Multiple sclerosis: Secondary | ICD-10-CM

## 2022-12-25 DIAGNOSIS — F4322 Adjustment disorder with anxiety: Secondary | ICD-10-CM | POA: Diagnosis not present

## 2022-12-25 MED ORDER — ZEPBOUND 2.5 MG/0.5ML ~~LOC~~ SOAJ: 2.5000 mg | SUBCUTANEOUS | 0 refills | Status: DC

## 2022-12-25 MED ORDER — DICYCLOMINE HCL 20 MG PO TABS: 20.0000 mg | ORAL_TABLET | Freq: Four times a day (QID) | ORAL | 1 refills | Status: AC

## 2022-12-25 MED ORDER — RIZATRIPTAN BENZOATE 10 MG PO TBDP: ORAL_TABLET | ORAL | 3 refills | Status: DC

## 2022-12-25 MED ORDER — LEVOCETIRIZINE DIHYDROCHLORIDE 5 MG PO TABS: ORAL_TABLET | ORAL | 2 refills | Status: DC

## 2022-12-25 MED ORDER — ZEPBOUND 2.5 MG/0.5ML ~~LOC~~ SOLN: 2.5000 mg | SUBCUTANEOUS | 1 refills | Status: DC

## 2022-12-25 MED ORDER — TACROLIMUS 0.03 % EX OINT: TOPICAL_OINTMENT | Freq: Two times a day (BID) | CUTANEOUS | 2 refills | Status: AC

## 2022-12-25 MED ORDER — CYANOCOBALAMIN 1000 MCG/ML IJ SOLN
1000.0000 ug | Freq: Once | INTRAMUSCULAR | Status: AC
  Administered 2022-12-25: 1000 ug via INTRAMUSCULAR

## 2022-12-25 MED ORDER — BETAMETHASONE DIPROPIONATE 0.05 % EX CREA: TOPICAL_CREAM | Freq: Two times a day (BID) | CUTANEOUS | 2 refills | Status: AC

## 2022-12-25 NOTE — Progress Notes (Signed)
Madelaine Bhat, CMA,acting as a Neurosurgeon for Arnette Felts, FNP.,have documented all relevant documentation on the behalf of Arnette Felts, FNP,as directed by  Arnette Felts, FNP while in the presence of Arnette Felts, FNP.  Subjective:  Patient ID: Kelly Alvarado , female    DOB: 02/03/1976 , 47 y.o.   MRN: [id number]  Chief Complaint  Patient presents with   Anxiety    HPI  Patient presents today for a chol follow up, Patient reports compliance with medication. Patient denies any chest pain, SOB, or headaches. Patient would like to start wegovy but her insurance no longer covers. She has not had Wegovy since last year. Patient would like to discuss other weight loss option. She had been in [place] for about one year and has relocated back here. During that time no changes with her health. Her weight one year ago 143 lb prior to her relocating.   Anxiety Presents for follow-up visit.       Past Medical History:  Diagnosis Date   Allergy    Anemia    HEAVY PERIODS   Headache(784.0)    MIGRAINES   Multiple sclerosis (HCC)    Neuromuscular disorder (HCC)    MS   Pulmonary embolism (HCC) 2013   lt-?bcp   Wears glasses      Family History  Problem Relation Age of Onset   Asthma Mother    Hypertension Father    Cancer Paternal Uncle    Cancer Paternal Grandfather      Current Outpatient Medications:    amphetamine-dextroamphetamine (ADDERALL XR) 25 MG 24 hr capsule, Take 1 capsule by mouth daily., Disp: 90 capsule, Rfl: 0   doxepin (SINEQUAN) 10 MG capsule, Take 1 capsule (10 mg total) by mouth at bedtime., Disp: 90 capsule, Rfl: 3   fluticasone (FLONASE) 50 MCG/ACT nasal spray, SPRAY 2 SPRAYS INTO EACH NOSTRIL EVERY DAY, Disp: 48 mL, Rfl: 3   gabapentin (NEURONTIN) 300 MG capsule, TAKE 1 CAPSULE IN AM, AND 1 CAP IN EVENING, AND 2 AT BEDTIME, Disp: 360 capsule, Rfl: 3   Galcanezumab-gnlm (EMGALITY) 120 MG/ML SOAJ, INJECT 1 ML INTO THE SKIN ONCE EVERY 30 DAYS, Disp: 3 mL,  Rfl: 3   HYDROcodone bit-homatropine (HYDROMET) 5-1.5 MG/5ML syrup, Take 5 mLs by mouth every 6 (six) hours as needed for cough., Disp: 120 mL, Rfl: 0   ocrelizumab (OCREVUS) 300 MG/10ML injection, Inject 600 mg into the vein every 6 (six) months., Disp: , Rfl:    tamsulosin (FLOMAX) 0.4 MG CAPS capsule, Take 1 capsule (0.4 mg total) by mouth daily., Disp: 90 capsule, Rfl: 3   tirzepatide (ZEPBOUND) 2.5 MG/0.5ML injection vial, Inject 2.5 mg into the skin once a week., Disp: 2 mL, Rfl: 1   betamethasone dipropionate 0.05 % cream, Apply topically 2 (two) times daily., Disp: 30 g, Rfl: 2   dicyclomine (BENTYL) 20 MG tablet, Take 1 tablet (20 mg total) by mouth every 6 (six) hours. prn, Disp: 90 tablet, Rfl: 1   levocetirizine (XYZAL) 5 MG tablet, TAKE 1 TABLET BY MOUTH ONCE DAILY IN THE EVENING, Disp: 90 tablet, Rfl: 2   rizatriptan (MAXALT-MLT) 10 MG disintegrating tablet, TAKE 1 TABLET BY MOUTH AS NEEDED FOR MIGRAINE. MAY REPEAT IN 2 HOURS IF NEEDED, Disp: 27 tablet, Rfl: 3   tacrolimus (PROTOPIC) 0.03 % ointment, Apply topically 2 (two) times daily., Disp: 100 g, Rfl: 2   Vitamin D, Ergocalciferol, (DRISDOL) 1.25 MG (50000 UNIT) CAPS capsule, Take 1 capsule (50,000 Units total) by  mouth 2 (two) times a week., Disp: 24 capsule, Rfl: 1  Current Facility-Administered Medications:    0.9 %  sodium chloride infusion, , Intravenous, PRN, Arnette Felts, FNP   Allergies  Allergen Reactions   Amoxicillin Hives    Has patient had a PCN reaction causing immediate rash, facial/tongue/throat swelling, SOB or lightheadedness with hypotension: Yes Has patient had a PCN reaction causing severe rash involving mucus membranes or skin necrosis: Yes Has patient had a PCN reaction that required hospitalization No Has patient had a PCN reaction occurring within the last 10 years: No If all of the above answers are "NO", then may proceed with Cephalosporin use.    Milk-Related Compounds Diarrhea and Nausea And  Vomiting    Stomach cramps   Yellow Dyes (Non-Tartrazine) Hives     Review of Systems  Constitutional: Negative.   Respiratory: Negative.    Cardiovascular: Negative.   Neurological: Negative.   Psychiatric/Behavioral: Negative.       Today's Vitals   12/25/22 1044  BP: 100/60  Pulse: 66  Temp: 97.8 F (36.6 C)  TempSrc: Oral  Weight: 165 lb 6.4 oz (75 kg)  Height: 5\' 4"  (1.626 m)  PainSc: 0-No pain   Body mass index is 28.39 kg/m.  Wt Readings from Last 3 Encounters:  12/25/22 165 lb 6.4 oz (75 kg)  11/07/21 143 lb 6.4 oz (65 kg)  10/24/21 150 lb (68 kg)    The 10-year ASCVD risk score (Arnett DK, et al., 2019) is: 0.3%   Values used to calculate the score:     Age: 69 years     Sex: Female     Is Non-Hispanic African American: Yes     Diabetic: No     Tobacco smoker: No     Systolic Blood Pressure: 100 mmHg     Is BP treated: No     HDL Cholesterol: 96 mg/dL     Total Cholesterol: 234 mg/dL  Objective:  Physical Exam Vitals reviewed.  Constitutional:      General: She is not in acute distress.    Appearance: Normal appearance.  Cardiovascular:     Pulses: Normal pulses.     Heart sounds: Normal heart sounds. No murmur heard. Pulmonary:     Effort: Pulmonary effort is normal. No respiratory distress.     Breath sounds: Normal breath sounds. No wheezing.  Neurological:     General: No focal deficit present.     Mental Status: She is alert and oriented to person, place, and time.     Cranial Nerves: No cranial nerve deficit.     Motor: No weakness.  Psychiatric:        Mood and Affect: Mood normal.        Behavior: Behavior normal.        Thought Content: Thought content normal.        Judgment: Judgment normal.        12/25/2022   10:48 AM  GAD 7 : Generalized Anxiety Score  Nervous, Anxious, on Edge 0  Control/stop worrying 0  Worry too much - different things 0  Trouble relaxing 0  Restless 0  Easily annoyed or irritable 0  Afraid - awful  might happen 0  Total GAD 7 Score 0  Anxiety Difficulty Not difficult at all       12/25/2022   10:48 AM 11/07/2021    9:08 AM 02/14/2021    4:10 PM 02/12/2020    8:52 AM 12/30/2018  9:02 AM  Depression screen PHQ 2/9  Decreased Interest 0 0 0 0 0  Down, Depressed, Hopeless 0 0 0 0 0  PHQ - 2 Score 0 0 0 0 0  Altered sleeping 0      Tired, decreased energy 0      Change in appetite 0      Feeling bad or failure about yourself  0      Trouble concentrating 0      Moving slowly or fidgety/restless 0      Suicidal thoughts 0      PHQ-9 Score 0      Difficult doing work/chores Not difficult at all            Assessment And Plan:  Elevated cholesterol Assessment & Plan: Will check cholesterol levels today.  Orders: -     Lipid panel  Vitamin D deficiency Assessment & Plan: Will check vitamin D level and supplement as needed.    Also encouraged to spend 15 minutes in the sun daily.    Orders: -     VITAMIN D 25 Hydroxy (Vit-D Deficiency, Fractures)  Multiple sclerosis (HCC) Assessment & Plan: She will be reestablished with [facility] since returning from [place]   Adjustment disorder with anxious mood Assessment & Plan: This is currently resolved her anxiety screen score is 0     Functional diarrhea Assessment & Plan: Refill sent at patient's request  Orders: -     Dicyclomine HCl; Take 1 tablet (20 mg total) by mouth every 6 (six) hours. prn  Dispense: 90 tablet; Refill: 1  B12 deficiency Assessment & Plan: Will check vitamin B12 levels today.  Orders: -     Vitamin B12 -     Cyanocobalamin  BMI 28.0-28.9,adult Assessment & Plan: She would like to start on Zepbound through the cash pay option.  Her weight has increased since October 2023 she was at 143 pounds she is now at 165 pounds.  Denies family history of medullary thyroid cancer or pancreatitis for herself.  She has been on a GLP-1 in the past and is familiar with the type of  medication.  Wt Readings from Last 3 Encounters:  12/25/22 165 lb 6.4 oz (75 kg)  11/07/21 143 lb 6.4 oz (65 kg)  10/24/21 150 lb (68 kg)     Orders: -     Zepbound; Inject 2.5 mg into the skin once a week.  Dispense: 2 mL; Refill: 1  Other orders -     Levocetirizine Dihydrochloride; TAKE 1 TABLET BY MOUTH ONCE DAILY IN THE EVENING  Dispense: 90 tablet; Refill: 2 -     Rizatriptan Benzoate; TAKE 1 TABLET BY MOUTH AS NEEDED FOR MIGRAINE. MAY REPEAT IN 2 HOURS IF NEEDED  Dispense: 27 tablet; Refill: 3 -     Tacrolimus; Apply topically 2 (two) times daily.  Dispense: 100 g; Refill: 2 -     Betamethasone Dipropionate; Apply topically 2 (two) times daily.  Dispense: 30 g; Refill: 2    Return in about 5 months (around 05/25/2023) for PHY WITH [facility]., 8-10 week weight check.  Patient was given opportunity to ask questions. Patient verbalized understanding of the plan and was able to repeat key elements of the plan. All questions were answered to their satisfaction.    Jeanell Sparrow, FNP, have reviewed all documentation for this visit. The documentation on 12/25/22 for the exam, diagnosis, procedures, and orders are all accurate and complete.   IF YOU HAVE  BEEN REFERRED TO A SPECIALIST, IT MAY TAKE 1-2 WEEKS TO SCHEDULE/PROCESS THE REFERRAL. IF YOU HAVE NOT HEARD FROM US/SPECIALIST IN TWO WEEKS, PLEASE GIVE [place] A CALL AT [phone].

## 2022-12-25 NOTE — Patient Instructions (Signed)
I encourage you to increase your physical activity and focus on a healthy diet.

## 2022-12-26 ENCOUNTER — Other Ambulatory Visit: Payer: Self-pay | Admitting: Nurse Practitioner

## 2022-12-26 DIAGNOSIS — E559 Vitamin D deficiency, unspecified: Secondary | ICD-10-CM

## 2022-12-26 LAB — LIPID PANEL
Chol/HDL Ratio: 2.4 ratio (ref 0.0–4.4)
Cholesterol, Total: 234 mg/dL — ABNORMAL HIGH (ref 100–199)
HDL: 96 mg/dL (ref >39)
LDL Chol Calc (NIH): 123 mg/dL — ABNORMAL HIGH (ref 0–99)
Triglycerides: 89 mg/dL (ref 0–149)
VLDL Cholesterol Cal: 15 mg/dL (ref 5–40)

## 2022-12-26 LAB — VITAMIN B12: Vitamin B-12: 501 pg/mL (ref 232–1245)

## 2022-12-26 LAB — VITAMIN D 25 HYDROXY (VIT D DEFICIENCY, FRACTURES): Vit D, 25-Hydroxy: 14.3 ng/mL — ABNORMAL LOW (ref 30.0–100.0)

## 2022-12-26 MED ORDER — VITAMIN D (ERGOCALCIFEROL) 1.25 MG (50000 UNIT) PO CAPS: 50000.0000 [IU] | ORAL_CAPSULE | ORAL | 1 refills | Status: DC

## 2022-12-30 DIAGNOSIS — Z6828 Body mass index (BMI) 28.0-28.9, adult: Secondary | ICD-10-CM | POA: Insufficient documentation

## 2022-12-30 DIAGNOSIS — E559 Vitamin D deficiency, unspecified: Secondary | ICD-10-CM | POA: Insufficient documentation

## 2022-12-30 DIAGNOSIS — F4322 Adjustment disorder with anxiety: Secondary | ICD-10-CM | POA: Insufficient documentation

## 2022-12-30 DIAGNOSIS — E7800 Pure hypercholesterolemia, unspecified: Secondary | ICD-10-CM | POA: Insufficient documentation

## 2022-12-30 DIAGNOSIS — E538 Deficiency of other specified B group vitamins: Secondary | ICD-10-CM | POA: Insufficient documentation

## 2022-12-30 NOTE — Assessment & Plan Note (Signed)
She would like to start on Zepbound through the cash pay option.  Her weight has increased since October 2023 she was at 143 pounds she is now at 165 pounds.  Denies family history of medullary thyroid cancer or pancreatitis for herself.  She has been on a GLP-1 in the past and is familiar with the type of medication.  Wt Readings from Last 3 Encounters:  12/25/22 165 lb 6.4 oz (75 kg)  11/07/21 143 lb 6.4 oz (65 kg)  10/24/21 150 lb (68 kg)

## 2022-12-30 NOTE — Assessment & Plan Note (Signed)
This is currently resolved her anxiety screen score is 0

## 2022-12-30 NOTE — Assessment & Plan Note (Signed)
Will check cholesterol levels today.

## 2022-12-30 NOTE — Assessment & Plan Note (Signed)
She will be reestablished with [facility] since returning from [place]

## 2022-12-30 NOTE — Assessment & Plan Note (Signed)
Will check vitamin B12 levels today.

## 2022-12-30 NOTE — Assessment & Plan Note (Signed)
Refill sent at patient's request

## 2022-12-30 NOTE — Assessment & Plan Note (Signed)
Will check vitamin D level and supplement as needed.    Also encouraged to spend 15 minutes in the sun daily.

## 2023-01-01 ENCOUNTER — Encounter: Payer: Self-pay | Admitting: Nurse Practitioner

## 2023-01-01 ENCOUNTER — Other Ambulatory Visit: Payer: Self-pay | Admitting: Nurse Practitioner

## 2023-01-01 DIAGNOSIS — E7800 Pure hypercholesterolemia, unspecified: Secondary | ICD-10-CM

## 2023-01-01 DIAGNOSIS — Z6828 Body mass index (BMI) 28.0-28.9, adult: Secondary | ICD-10-CM

## 2023-01-01 MED ORDER — ZEPBOUND 2.5 MG/0.5ML ~~LOC~~ SOLN: 2.5000 mg | SUBCUTANEOUS | 1 refills | Status: DC

## 2023-01-10 ENCOUNTER — Encounter: Payer: Self-pay | Admitting: Neurology

## 2023-01-10 ENCOUNTER — Telehealth: Payer: Self-pay | Admitting: *Deleted

## 2023-01-10 ENCOUNTER — Ambulatory Visit: Payer: BC Managed Care – PPO | Admitting: Neurology

## 2023-01-10 VITALS — BP 112/74 | HR 58 | Ht 65.0 in | Wt 164.0 lb

## 2023-01-10 DIAGNOSIS — R208 Other disturbances of skin sensation: Secondary | ICD-10-CM

## 2023-01-10 DIAGNOSIS — G4700 Insomnia, unspecified: Secondary | ICD-10-CM | POA: Diagnosis not present

## 2023-01-10 DIAGNOSIS — G35 Multiple sclerosis: Secondary | ICD-10-CM | POA: Diagnosis not present

## 2023-01-10 DIAGNOSIS — R569 Unspecified convulsions: Secondary | ICD-10-CM

## 2023-01-10 DIAGNOSIS — R41840 Attention and concentration deficit: Secondary | ICD-10-CM

## 2023-01-10 DIAGNOSIS — Z79899 Other long term (current) drug therapy: Secondary | ICD-10-CM | POA: Diagnosis not present

## 2023-01-10 DIAGNOSIS — G43009 Migraine without aura, not intractable, without status migrainosus: Secondary | ICD-10-CM

## 2023-01-10 MED ORDER — TAMSULOSIN HCL 0.4 MG PO CAPS: 0.4000 mg | ORAL_CAPSULE | Freq: Every day | ORAL | 3 refills | Status: AC

## 2023-01-10 MED ORDER — AMPHETAMINE-DEXTROAMPHET ER 25 MG PO CP24: 25.0000 mg | ORAL_CAPSULE | Freq: Every day | ORAL | 0 refills | Status: DC

## 2023-01-10 MED ORDER — EMGALITY 120 MG/ML ~~LOC~~ SOAJ: SUBCUTANEOUS | 3 refills | Status: DC

## 2023-01-10 NOTE — Telephone Encounter (Signed)
Faxed complete/signed Ocrevus start form to [facility] at [phone]. Received fax confirmation. Gave completed/signed order to [facility]. Pt last received Ocrevus via [facility] 06/23/22 w/ [facility] home infusion.

## 2023-01-10 NOTE — Progress Notes (Signed)
[facility]  PATIENT: Kelly Alvarado DOB: April 04, 1975  REFERRING DOCTOR OR PCP:  Dorothyann Peng  SOURCE: patient and records  _________________________________   HISTORICAL  CHIEF COMPLAINT:  Chief Complaint  Patient presents with   Follow-up    Pt in room 10. Here for Ocrevus/MS follow up. Pt reports MS is stable. No concerns. Needs visit to have infusion.      HISTORY OF PRESENT ILLNESS:  Kelly Alvarado is a 47 year old woman with relapsing remitting MS diagnosed in 2010 and h/o a seizure in 2017.  History of Present Illness: Update 01/2023: Her last Ocrevus infusion was May 2024 and she is not scheduled at this time. She did her last one up in [place].    She denies any exacerbations.   However, she is feeling worse in general    She moved to [place], [place] but is now back in [place]. She often feels a little worse the last month of each infusion cycle.    Gait is doing ok but she feels it is a little slower due to pain.   She can go up and down stairs without the bannister.   She can walk > 3 miles.   No falls.   No clumsiness.   She has mild tingling but no pain in her limbs.  Gabapentin helps the tingling.    She has mild left sided weakness.     She has had frequent UTIs and feels she does not completely empty.  Tamsulosin helped but she ran out  She has had fatigue and reduced focus/attention.   Adderall has helped.   She had less fatigue when she was on Tysabri.   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   She is on Ocrevus now and feels worse the last month of each cycle.   She is currently in the last month.  No LOC spells or seizures recently.  Only seizure was in 2017 a few days after hysterectomy.    Chronic migraines are much better on Emgality though she still has some and will take Maxalt when one occurs.     In past tried topiramate, zonisamide, nortriptyline and NSAIDs without much benefit.     She is  on Vit D supplements 50000 U weekly    MS History:    She was diagnosed with multiple sclerosis in 2010 after presenting with a left foot drop. MRI of the brain was consistent with multiple sclerosis.   She was  started on Rebif. The MS was well controlled but she felt poorly on that medication. She transferred to [facility] and began to see me in 2012. She was placed on Tysabri for about a year and felt much better while on it. Her MS did well on Tysabri as well.  Unfortunately, she was JCV positive and after about a year she stopped Tysabri and started Gilenya. She had done fairly well on Gilenya with no clinical exacerbations.  However, she switched to [facility] in August 2020 after MRI showed new lesions.    IMAGING MRI brain 07/01/2018 shows multiple T2/flair hyperintense foci in the spinal cord, left middle cerebellar peduncle and in the periventricular, juxtacortical and deep white matter of the hemispheres.  These are consistent with chronic demyelinating plaque associated with multiple sclerosis.  None of the foci enhances or appears to be acute.  However, the 2 foci in the spinal cord and several of the foci in the hemispheres  were not present on the 2012 MRI.   There is a normal enhancement pattern and no acute findings.  MRI cervical spine 07/01/2018 shows T2 hyperintense foci within the spinal cord adjacent to C1-C2, C3-C4, C5 and T3.  These are consistent with chronic demyelinating plaque associated with multiple sclerosis.  None of the foci enhanced on the current study.  However, none of the 4 foci were noted on the MRI from 2010     REVIEW OF SYSTEMS: Constitutional: No fevers, chills, sweats, or change in appetite.  She notes fatigue and insomnia. Eyes: No double vision, eye pain.  She has right visula loss over last year.   Ear, nose and throat: No hearing loss, ear pain, nasal congestion, sore throat Cardiovascular: No chest pain, palpitations Respiratory:  No shortness  of breath at rest or with exertion.   No wheezes GastrointestinaI: No nausea, vomiting, diarrhea, abdominal pain, fecal incontinence Genitourinary:  No dysuria, urinary retention or frequency.  No nocturia. Musculoskeletal:  No neck pain, back .  Notes myalgias at times Integumentary: No rash, pruritus, skin lesions Neurological: as above Psychiatric: No depression at this time.  No anxiety Endocrine: No palpitations, diaphoresis, change in appetite, change in weigh or increased thirst Hematologic/Lymphatic:  No anemia, purpura, petechiae. Allergic/Immunologic: No itchy/runny eyes, nasal congestion, recent allergic reactions, rashes.  She has some food allergies.  ALLERGIES: Allergies  Allergen Reactions   Amoxicillin Hives    Has patient had a PCN reaction causing immediate rash, facial/tongue/throat swelling, SOB or lightheadedness with hypotension: Yes Has patient had a PCN reaction causing severe rash involving mucus membranes or skin necrosis: Yes Has patient had a PCN reaction that required hospitalization No Has patient had a PCN reaction occurring within the last 10 years: No If all of the above answers are "NO", then may proceed with Cephalosporin use.    Milk-Related Compounds Diarrhea and Nausea And Vomiting    Stomach cramps   Yellow Dyes (Non-Tartrazine) Hives    HOME MEDICATIONS:  Current Outpatient Medications:    betamethasone dipropionate 0.05 % cream, Apply topically 2 (two) times daily., Disp: 30 g, Rfl: 2   dicyclomine (BENTYL) 20 MG tablet, Take 1 tablet (20 mg total) by mouth every 6 (six) hours. prn, Disp: 90 tablet, Rfl: 1   doxepin (SINEQUAN) 10 MG capsule, Take 1 capsule (10 mg total) by mouth at bedtime., Disp: 90 capsule, Rfl: 3   fluticasone (FLONASE) 50 MCG/ACT nasal spray, SPRAY 2 SPRAYS INTO EACH NOSTRIL EVERY DAY, Disp: 48 mL, Rfl: 3   gabapentin (NEURONTIN) 300 MG capsule, TAKE 1 CAPSULE IN AM, AND 1 CAP IN EVENING, AND 2 AT BEDTIME, Disp: 360  capsule, Rfl: 3   HYDROcodone bit-homatropine (HYDROMET) 5-1.5 MG/5ML syrup, Take 5 mLs by mouth every 6 (six) hours as needed for cough., Disp: 120 mL, Rfl: 0   levocetirizine (XYZAL) 5 MG tablet, TAKE 1 TABLET BY MOUTH ONCE DAILY IN THE EVENING, Disp: 90 tablet, Rfl: 2   ocrelizumab (OCREVUS) 300 MG/10ML injection, Inject 600 mg into the vein every 6 (six) months., Disp: , Rfl:    rizatriptan (MAXALT-MLT) 10 MG disintegrating tablet, TAKE 1 TABLET BY MOUTH AS NEEDED FOR MIGRAINE. MAY REPEAT IN 2 HOURS IF NEEDED, Disp: 27 tablet, Rfl: 3   tacrolimus (PROTOPIC) 0.03 % ointment, Apply topically 2 (two) times daily., Disp: 100 g, Rfl: 2   tirzepatide (ZEPBOUND) 2.5 MG/0.5ML injection vial, Inject 2.5 mg into the skin once a week., Disp: 2 mL, Rfl: 1  Vitamin D, Ergocalciferol, (DRISDOL) 1.25 MG (50000 UNIT) CAPS capsule, Take 1 capsule (50,000 Units total) by mouth 2 (two) times a week., Disp: 24 capsule, Rfl: 1   amphetamine-dextroamphetamine (ADDERALL XR) 25 MG 24 hr capsule, Take 1 capsule by mouth daily., Disp: 90 capsule, Rfl: 0   Galcanezumab-gnlm (EMGALITY) 120 MG/ML SOAJ, INJECT 1 ML INTO THE SKIN ONCE EVERY 30 DAYS, Disp: 3 mL, Rfl: 3   tamsulosin (FLOMAX) 0.4 MG CAPS capsule, Take 1 capsule (0.4 mg total) by mouth daily., Disp: 90 capsule, Rfl: 3  Current Facility-Administered Medications:    0.9 %  sodium chloride infusion, , Intravenous, PRN, Arnette Felts, FNP  PAST MEDICAL HISTORY: Past Medical History:  Diagnosis Date   Allergy    Anemia    HEAVY PERIODS   Headache(784.0)    MIGRAINES   Multiple sclerosis (HCC)    Neuromuscular disorder (HCC)    MS   Pulmonary embolism (HCC) 2013   lt-?bcp   Wears glasses     PAST SURGICAL HISTORY: Past Surgical History:  Procedure Laterality Date   ABDOMINAL HYSTERECTOMY     BILATERAL SALPINGECTOMY Bilateral 12/27/2015   Procedure: BILATERAL SALPINGECTOMY;  Surgeon: Osborn Coho, MD;  Location: [facility];  Service: Gynecology;   Laterality: Bilateral;  Excision of paratubal cyst   BREAST LUMPECTOMY WITH RADIOACTIVE SEED LOCALIZATION Left 10/06/2013   Procedure:  RADIOACTIVE SEED LOCALIZATION LEFT BREAST LUMPECTOMY;  Surgeon: Adolph Pollack, MD;  Location: [facility];  Service: General;  Laterality: Left;   BREAST REDUCTION SURGERY     BREAST SURGERY     CYSTOSCOPY N/A 12/27/2015   Procedure: CYSTOSCOPY;  Surgeon: Osborn Coho, MD;  Location: [facility];  Service: Gynecology;  Laterality: N/A;   REPAIR VAGINAL CUFF N/A 01/09/2016   Procedure: REPAIR VAGINAL CUFF;  Surgeon: Osborn Coho, MD;  Location: [facility];  Service: Gynecology;  Laterality: N/A;   VAGINAL HYSTERECTOMY Bilateral 12/27/2015   Procedure: HYSTERECTOMY VAGINAL;  Surgeon: Osborn Coho, MD;  Location: [facility];  Service: Gynecology;  Laterality: Bilateral;   WISDOM TOOTH EXTRACTION      FAMILY HISTORY: Family History  Problem Relation Age of Onset   Asthma Mother    Hypertension Father    Cancer Paternal Uncle    Cancer Paternal Grandfather        DIAGNOSTIC DATA (LABS, IMAGING, TESTING) - I reviewed patient records, labs, notes, testing and imaging myself where available.  Lab Results  Component Value Date   WBC 3.7 12/07/2021   HGB 11.6 12/07/2021   HCT 34.0 12/07/2021   MCV 91 12/07/2021   PLT 310 12/07/2021   ______________________________________________________________________________ Physical Exam    Today's Vitals   01/10/23 1344  BP: 112/74  Pulse: (!) 58  Weight: 164 lb (74.4 kg)  Height: 5\' 5"  (1.651 m)    Body mass index is 27.29 kg/m.    General: The patient is well-developed and well-nourished and in no acute distress   Musculoskeletal:  Neck has good ROM and is nontender     Neurologic Exam   Mental status: The patient is alert and oriented x 3 at the time of the examination. The patient has apparent normal recent and remote memory, with an apparently normal attention span and  concentration ability.   Speech is normal.   Cranial nerves: Extraocular movements are full.  Facial strength and sensation are normal.     No obvious hearing deficits are noted.   Motor:  Muscle bulk is normal.   Tone  is normal. Strength is  5 / 5 in all 4 extremities.    Sensory: He has intact sensation to touch and vibration in the arms and legs.   Coordination: Cerebellar testing reveals good finger-nose-finger and heel-to-shin bilaterally.   Gait and station: Station is normal.   Gait was fairly normal .  Tandem gait is wide.. Romberg is negative.    Reflexes: Deep tendon reflexes are symmetric and normal bilaterally    ASSESSMENT AND PLAN    1. Multiple sclerosis (HCC)   2. High risk medication use   3. Dysesthesia   4. Insomnia, unspecified type   5. Convulsions, unspecified convulsion type (HCC)   6. Common migraine without intractability   7. Attention deficit        1.   Continue Ocrevus.  Check IgG/IgM .  She is a couple weeks late - had moved out of town but since returned.  2.    Continue Emgality for chronic migraine   Maxalt or Tylenol for breakthrough.  Continue low-dose doxepin for insomnia.   3.  Tamsulosin for urinary hesitancy 4.    She will return as needed if there are new or worsening neurologic symptoms.  If she returns to the area I would be happy to see her again for her MS.   Danette Weinfeld A. Epimenio Foot, MD, PhD, FAAN Certified in Neurology, Clinical Neurophysiology, Sleep Medicine, Pain Medicine and Neuroimaging Director, [facility] at [facility]  [facility] [address], [address] [place], [place] [address] [phone]

## 2023-01-12 LAB — IGG, IGA, IGM
IgA/Immunoglobulin A, Serum: 93 mg/dL (ref 87–352)
IgG (Immunoglobin G), Serum: 1018 mg/dL (ref 586–1602)
IgM (Immunoglobulin M), Srm: 35 mg/dL (ref 26–217)

## 2023-01-12 LAB — CD20 B CELLS
% CD19-B Cells: 0 % — ABNORMAL LOW (ref 4.6–22.1)
% CD20-B Cells: 0 % — ABNORMAL LOW (ref 5.0–22.3)

## 2023-01-24 ENCOUNTER — Other Ambulatory Visit (HOSPITAL_COMMUNITY): Payer: Self-pay

## 2023-02-05 ENCOUNTER — Telehealth: Payer: Self-pay

## 2023-02-05 ENCOUNTER — Other Ambulatory Visit (HOSPITAL_COMMUNITY): Payer: Self-pay

## 2023-02-05 NOTE — Telephone Encounter (Signed)
*  [facility]  Pharmacy Patient Advocate Encounter   Received notification from CoverMyMeds that prior authorization for Emgality  120MG /ML auto-injectors (migraine)  is required/requested.   Insurance verification completed.   The patient is insured through [employer] [facility] .   Per test claim: PA required; PA submitted to above mentioned insurance via CoverMyMeds Key/confirmation #/EOC [id number] Status is pending

## 2023-02-06 ENCOUNTER — Encounter: Payer: Self-pay | Admitting: Neurology

## 2023-02-07 ENCOUNTER — Other Ambulatory Visit (HOSPITAL_COMMUNITY): Payer: Self-pay

## 2023-02-07 NOTE — Telephone Encounter (Signed)
 Pharmacy Patient Advocate Encounter  Received notification from [employer] [facility] that Prior Authorization for Emgality  120MG /ML auto-injectors (migraine) has been APPROVED from 02/06/2023 to 02/05/2024. Ran test claim, Copay is $35.00 per 90DS. This test claim was processed through [facility]- copay amounts may vary at other pharmacies due to pharmacy/plan contracts, or as the patient moves through the different stages of their insurance plan.   PA #/Case ID/Reference #: PA# [place]  [facility] [employer] Plan [id number] Non-Grandfathered [id number] TP

## 2023-02-11 ENCOUNTER — Other Ambulatory Visit: Payer: Self-pay | Admitting: Nurse Practitioner

## 2023-02-11 DIAGNOSIS — Z6828 Body mass index (BMI) 28.0-28.9, adult: Secondary | ICD-10-CM

## 2023-02-11 DIAGNOSIS — E7800 Pure hypercholesterolemia, unspecified: Secondary | ICD-10-CM

## 2023-02-15 ENCOUNTER — Other Ambulatory Visit (HOSPITAL_COMMUNITY): Payer: Self-pay

## 2023-03-13 ENCOUNTER — Ambulatory Visit: Payer: BC Managed Care – PPO | Admitting: Internal Medicine

## 2023-03-14 ENCOUNTER — Encounter: Payer: Self-pay | Admitting: Neurology

## 2023-03-14 ENCOUNTER — Encounter: Payer: Self-pay | Admitting: *Deleted

## 2023-03-14 ENCOUNTER — Telehealth: Payer: Self-pay | Admitting: Neurology

## 2023-03-14 NOTE — Telephone Encounter (Signed)
 Pt called stating that she has been trying to get her infusion since Nov and when she called her insurance company, they informed her that they never received a request. Please advise.

## 2023-03-14 NOTE — Telephone Encounter (Signed)
 See other mychart message.

## 2023-03-14 NOTE — Telephone Encounter (Signed)
 See mychart from 03/14/23

## 2023-04-03 ENCOUNTER — Encounter: Payer: Self-pay | Admitting: Neurology

## 2023-04-08 ENCOUNTER — Encounter: Payer: Self-pay | Admitting: *Deleted

## 2023-04-08 ENCOUNTER — Encounter: Payer: Self-pay | Admitting: Neurology

## 2023-04-11 ENCOUNTER — Telehealth: Payer: Self-pay | Admitting: Pharmacy Technician

## 2023-04-11 NOTE — Telephone Encounter (Signed)
 Pharmacy Patient Advocate Encounter   Received notification from CoverMyMeds that prior authorization for Ocrevus 300MG /10ML solution is required/requested.   Insurance verification completed.   The patient is insured through [employer] [facility] .   Per test claim: PA required; PA started via CoverMyMeds. KEY [id number] . Waiting for clinical questions to populate.

## 2023-04-11 NOTE — Telephone Encounter (Signed)
 Clinical Questions have been submitted

## 2023-04-11 NOTE — Telephone Encounter (Signed)
 Pharmacy Patient Advocate Encounter  Received notification from [facility] that Prior Authorization for Ocrevus 300MG /10ML solution  has been APPROVED from 04/11/2023 to 04/10/2024   PA #/Case ID/Reference #: [id number]

## 2023-04-18 NOTE — Telephone Encounter (Signed)
 Spoke w/ Kim/[facility]. She had Ocrevus infusion 04/04/23. Next one scheduled for: 10/03/23

## 2023-05-22 ENCOUNTER — Encounter: Payer: Self-pay | Admitting: Internal Medicine

## 2023-05-22 ENCOUNTER — Telehealth: Payer: Self-pay | Admitting: Internal Medicine

## 2023-05-22 ENCOUNTER — Other Ambulatory Visit: Payer: Self-pay | Admitting: Internal Medicine

## 2023-05-22 MED ORDER — LEVOCETIRIZINE DIHYDROCHLORIDE 5 MG PO TABS: ORAL_TABLET | ORAL | 2 refills | Status: DC

## 2023-05-22 MED ORDER — HYDROCODONE BIT-HOMATROP MBR 5-1.5 MG/5ML PO SOLN: 5.0000 mL | Freq: Four times a day (QID) | ORAL | 0 refills | Status: DC | PRN

## 2023-05-22 NOTE — Telephone Encounter (Signed)
 Pt called stating she wanted medication for the flu. She did home test on Monday, sx started more than 3 days ago. Has persistent fever and cough, unable to rest. She is allergic to yellow dye; therefore, Tamiflu is not an option. Okay w/ symptomatic treatment.   On phone, sounds as if she has PND as well. Will send rx levocetirizine and hydromet syrup. Advised to avoid dairy, stay well hydrated and drink hot beverage daily. Also suggest picking up Oscillococcinum from the pharmacy.   RS

## 2023-05-27 ENCOUNTER — Encounter: Payer: Self-pay | Admitting: Internal Medicine

## 2023-06-12 ENCOUNTER — Encounter: Payer: Self-pay | Admitting: Neurology

## 2023-06-12 DIAGNOSIS — Z0289 Encounter for other administrative examinations: Secondary | ICD-10-CM

## 2023-06-13 NOTE — Telephone Encounter (Signed)
 Gave completed/signed form back to medical records to process for pt.

## 2023-06-19 ENCOUNTER — Ambulatory Visit: Payer: Self-pay

## 2023-06-19 NOTE — Telephone Encounter (Signed)
 Copied from CRM [id number]. Topic: Clinical - Red Word Triage >> Jun 19, 2023  3:05 PM Hassie Lint wrote: Red Word that prompted transfer to Nurse Triage: Patient states she recently tested positive for Covid on 04/18, since then she has had a persistent cough with some mucous at times as well as pain in the chest from the cough. Has also had a headache off an on because of the coughing.   Chief Complaint: Cough Symptoms: Cough, headaches, chest pain with cough  Frequency: Frequent  Pertinent Negatives: Patient denies shortness of breath, fever Disposition: [] ED /[] Urgent Care (no appt availability in office) / [x] Appointment(In office/virtual)/ []  [facility]/ [] Home Care/ [] Refused Recommended Disposition /[] [facility]/ []  Follow-up with PCP Additional Notes: Patient states she was diagnosed with COVID last month and has had a persistent cough since that time. She states her cough has also been causing her headache and chest pain. She states the chest pain is only present when coughing. She denies any fevers or shortness of breath. Appointment made for the patient tomorrow for evaluation.     Reason for Disposition  Cough has been present for > 3 weeks  Answer Assessment - Initial Assessment Questions 1. ONSET: "When did the cough begin?"      05/27/23, since COVID 2. SEVERITY: "How bad is the cough today?"      Moderate  3. SPUTUM: "Describe the color of your sputum" (none, dry cough; clear, white, yellow, green)     Very little  4. HEMOPTYSIS: "Are you coughing up any blood?" If so ask: "How much?" (flecks, streaks, tablespoons, etc.)     No 5. DIFFICULTY BREATHING: "Are you having difficulty breathing?" If Yes, ask: "How bad is it?" (e.g., mild, moderate, severe)    - MILD: No SOB at rest, mild SOB with walking, speaks normally in sentences, can lie down, no retractions, pulse < 100.    - MODERATE: SOB at rest, SOB with minimal exertion and prefers to sit, cannot  lie down flat, speaks in phrases, mild retractions, audible wheezing, pulse 100-120.    - SEVERE: Very SOB at rest, speaks in single words, struggling to breathe, sitting hunched forward, retractions, pulse > 120      No 6. FEVER: "Do you have a fever?" If Yes, ask: "What is your temperature, how was it measured, and when did it start?"     No 7. CARDIAC HISTORY: "Do you have any history of heart disease?" (e.g., heart attack, congestive heart failure)      No 8. LUNG HISTORY: "Do you have any history of lung disease?"  (e.g., pulmonary embolus, asthma, emphysema)     No 9. PE RISK FACTORS: "Do you have a history of blood clots?" (or: recent major surgery, recent prolonged travel, bedridden)     No 10. OTHER SYMPTOMS: "Do you have any other symptoms?" (e.g., runny nose, wheezing, chest pain)       Headache, chest pain with cough  Protocols used: Cough - Acute Non-Productive-A-AH

## 2023-06-20 ENCOUNTER — Ambulatory Visit: Payer: Self-pay | Admitting: Family Medicine

## 2023-06-20 ENCOUNTER — Encounter: Payer: Self-pay | Admitting: Family Medicine

## 2023-06-20 VITALS — BP 122/70 | HR 72 | Temp 98.1°F | Ht 65.0 in | Wt 173.4 lb

## 2023-06-20 DIAGNOSIS — Z6828 Body mass index (BMI) 28.0-28.9, adult: Secondary | ICD-10-CM

## 2023-06-20 DIAGNOSIS — Z2821 Immunization not carried out because of patient refusal: Secondary | ICD-10-CM | POA: Diagnosis not present

## 2023-06-20 DIAGNOSIS — E663 Overweight: Secondary | ICD-10-CM

## 2023-06-20 DIAGNOSIS — R051 Acute cough: Secondary | ICD-10-CM | POA: Diagnosis not present

## 2023-06-20 MED ORDER — BENZONATATE 100 MG PO CAPS: 100.0000 mg | ORAL_CAPSULE | Freq: Three times a day (TID) | ORAL | 0 refills | Status: DC | PRN

## 2023-06-20 MED ORDER — TRIAMCINOLONE ACETONIDE 40 MG/ML IJ SUSP
60.0000 mg | Freq: Once | INTRAMUSCULAR | Status: AC
  Administered 2023-06-20: 60 mg via INTRAMUSCULAR

## 2023-06-20 MED ORDER — PREDNISONE 5 MG PO TABS: ORAL_TABLET | ORAL | 0 refills | Status: DC

## 2023-06-20 NOTE — Progress Notes (Signed)
 [place] Favia, CMA,acting as a Neurosurgeon for Melodie Spry, NP.,have documented all relevant documentation on the behalf of Melodie Spry, NP,as directed by  Melodie Spry, NP while in the presence of Melodie Spry, NP.  Subjective:  Patient ID: Kelly Alvarado , female    DOB: [birth year] , 48 y.o.   MRN: [id number]  Chief Complaint  Patient presents with   Cough         HPI  Patient is a 48 year old female presents today for evaluation of  cough. She reports  that she had a cough before  she tested positive for COVID 19 on the 21st of April 2025 and treated with PAXLOVID .  She took OTC medication for the cough, then she was prescribed HYDROcodone  bit-homatropine (HYDROMET) 5-1.5 MG/5ML syrup which she reports it doesn't work.   Cough is non-productive, she denies any fever.     Past Medical History:  Diagnosis Date   Allergy    Anemia    HEAVY PERIODS   Headache(784.0)    MIGRAINES   Multiple sclerosis (HCC)    Neuromuscular disorder (HCC)    MS   Pulmonary embolism (HCC) 2013   lt-?bcp   Wears glasses      Family History  Problem Relation Age of Onset   Asthma Mother    Hypertension Father    Cancer Paternal Uncle    Cancer Paternal Grandfather      Current Outpatient Medications:    amphetamine -dextroamphetamine (ADDERALL XR) 25 MG 24 hr capsule, Take 1 capsule by mouth daily., Disp: 90 capsule, Rfl: 0   benzonatate  (TESSALON  PERLES) 100 MG capsule, Take 1 capsule (100 mg total) by mouth 3 (three) times daily as needed., Disp: 30 capsule, Rfl: 0   betamethasone  dipropionate 0.05 % cream, Apply topically 2 (two) times daily., Disp: 30 g, Rfl: 2   dicyclomine  (BENTYL ) 20 MG tablet, Take 1 tablet (20 mg total) by mouth every 6 (six) hours. prn, Disp: 90 tablet, Rfl: 1   doxepin  (SINEQUAN ) 10 MG capsule, Take 1 capsule (10 mg total) by mouth at bedtime., Disp: 90 capsule, Rfl: 3   fluticasone  (FLONASE ) 50 MCG/ACT nasal spray, SPRAY 2 SPRAYS INTO EACH NOSTRIL EVERY DAY, Disp: 48  mL, Rfl: 3   gabapentin  (NEURONTIN ) 300 MG capsule, TAKE 1 CAPSULE IN AM, AND 1 CAP IN EVENING, AND 2 AT BEDTIME, Disp: 360 capsule, Rfl: 3   Galcanezumab -gnlm (EMGALITY ) 120 MG/ML SOAJ, INJECT 1 ML INTO THE SKIN ONCE EVERY 30 DAYS, Disp: 3 mL, Rfl: 3   HYDROcodone  bit-homatropine (HYDROMET) 5-1.5 MG/5ML syrup, Take 5 mLs by mouth every 6 (six) hours as needed for cough., Disp: 120 mL, Rfl: 0   levocetirizine (XYZAL ) 5 MG tablet, TAKE 1 TABLET BY MOUTH ONCE DAILY IN THE EVENING as needed, Disp: 90 tablet, Rfl: 2   ocrelizumab (OCREVUS) 300 MG/10ML injection, Inject 600 mg into the vein every 6 (six) months., Disp: , Rfl:    predniSONE  (DELTASONE ) 5 MG tablet, Take medication as directed., Disp: 21 tablet, Rfl: 0   rizatriptan  (MAXALT -MLT) 10 MG disintegrating tablet, TAKE 1 TABLET BY MOUTH AS NEEDED FOR MIGRAINE. MAY REPEAT IN 2 HOURS IF NEEDED, Disp: 27 tablet, Rfl: 3   tacrolimus  (PROTOPIC ) 0.03 % ointment, Apply topically 2 (two) times daily., Disp: 100 g, Rfl: 2   tamsulosin  (FLOMAX ) 0.4 MG CAPS capsule, Take 1 capsule (0.4 mg total) by mouth daily., Disp: 90 capsule, Rfl: 3   Vitamin D , Ergocalciferol , (DRISDOL ) 1.25 MG (50000 UNIT)  CAPS capsule, Take 1 capsule (50,000 Units total) by mouth 2 (two) times a week., Disp: 24 capsule, Rfl: 1   ZEPBOUND  2.5 MG/0.5ML injection vial, INJECT 0.5 ML (2.5 MG) UNDER THE SKIN ONCE WEEKLY (0.5ML= 50 UNITS) (Patient not taking: Reported on 06/20/2023), Disp: 2 mL, Rfl: 1  Current Facility-Administered Medications:    0.9 %  sodium chloride  infusion, , Intravenous, PRN, Moore, Janece, FNP   Allergies  Allergen Reactions   Amoxicillin Hives    Has patient had a PCN reaction causing immediate rash, facial/tongue/throat swelling, SOB or lightheadedness with hypotension: Yes Has patient had a PCN reaction causing severe rash involving mucus membranes or skin necrosis: Yes Has patient had a PCN reaction that required hospitalization No Has patient had a PCN  reaction occurring within the last 10 years: No If all of the above answers are "NO", then may proceed with Cephalosporin use.    Milk-Related Compounds Diarrhea and Nausea And Vomiting    Stomach cramps   Yellow Dyes (Non-Tartrazine) Hives     Review of Systems  Constitutional: Negative.   HENT:  Negative for congestion.   Eyes: Negative.   Respiratory:  Positive for cough.   Cardiovascular: Negative.   Gastrointestinal: Negative.   Skin: Negative.   Neurological:  Positive for headaches.  Psychiatric/Behavioral: Negative.       Today's Vitals   06/20/23 1157  BP: 122/70  Pulse: 72  Temp: 98.1 F (36.7 C)  TempSrc: Oral  Weight: 173 lb 6.4 oz (78.7 kg)  Height: 5\' 5"  (1.651 m)  PainSc: 0-No pain   Body mass index is 28.86 kg/m.  Wt Readings from Last 3 Encounters:  06/20/23 173 lb 6.4 oz (78.7 kg)  01/10/23 164 lb (74.4 kg)  12/25/22 165 lb 6.4 oz (75 kg)    The 10-year ASCVD risk score (Arnett DK, et al., 2019) is: 0.7%   Values used to calculate the score:     Age: 71 years     Sex: Female     Is Non-Hispanic African American: Yes     Diabetic: No     Tobacco smoker: No     Systolic Blood Pressure: 122 mmHg     Is BP treated: No     HDL Cholesterol: 96 mg/dL     Total Cholesterol: 234 mg/dL  Objective:  Physical Exam HENT:     Head: Normocephalic.  Cardiovascular:     Rate and Rhythm: Normal rate and regular rhythm.  Pulmonary:     Effort: Pulmonary effort is normal.     Breath sounds: Normal breath sounds.  Neurological:     Mental Status: She is alert and oriented to person, place, and time.         Assessment And Plan:  Acute cough -     Triamcinolone  Acetonide -     predniSONE ; Take medication as directed.  Dispense: 21 tablet; Refill: 0 -     Benzonatate ; Take 1 capsule (100 mg total) by mouth 3 (three) times daily as needed.  Dispense: 30 capsule; Refill: 0  COVID-19 vaccination declined  Overweight with body mass index (BMI) of 28 to  28.9 in adult Assessment & Plan: Continue to maintain BMI of 30 or below to avoid cardiovascular risks.     Return in about 6 months (around 12/21/2023) for anxiety with sanders. .  Patient was given opportunity to ask questions. Patient verbalized understanding of the plan and was able to repeat key elements of the plan. All questions  were answered to their satisfaction.    I, Melodie Spry, NP, have reviewed all documentation for this visit. The documentation on 06/29/2023 for the exam, diagnosis, procedures, and orders are all accurate and complete.    IF YOU HAVE BEEN REFERRED TO A SPECIALIST, IT MAY TAKE 1-2 WEEKS TO SCHEDULE/PROCESS THE REFERRAL. IF YOU HAVE NOT HEARD FROM US /SPECIALIST IN TWO WEEKS, PLEASE GIVE US  A CALL AT [phone].

## 2023-06-29 DIAGNOSIS — Z2821 Immunization not carried out because of patient refusal: Secondary | ICD-10-CM | POA: Insufficient documentation

## 2023-06-29 DIAGNOSIS — Z6828 Body mass index (BMI) 28.0-28.9, adult: Secondary | ICD-10-CM | POA: Insufficient documentation

## 2023-06-29 DIAGNOSIS — R051 Acute cough: Secondary | ICD-10-CM | POA: Insufficient documentation

## 2023-06-29 NOTE — Assessment & Plan Note (Signed)
 Continue to maintain BMI of 30 or below to avoid cardiovascular risks.

## 2023-07-17 ENCOUNTER — Telehealth: Payer: Self-pay | Admitting: Neurology

## 2023-07-17 NOTE — Telephone Encounter (Signed)
 Request to r/s appointment , pt also placed on wait list

## 2023-07-24 ENCOUNTER — Ambulatory Visit: Payer: BC Managed Care – PPO | Admitting: Neurology

## 2023-08-23 ENCOUNTER — Encounter: Payer: Self-pay | Admitting: Neurology

## 2023-08-26 NOTE — Telephone Encounter (Signed)
 Called pt. Scheduled appt for 08/27/23 at 1:30pm with Dr. Vear, check in 1:00pm

## 2023-08-27 ENCOUNTER — Ambulatory Visit: Admitting: Neurology

## 2023-08-27 ENCOUNTER — Encounter: Payer: Self-pay | Admitting: Neurology

## 2023-08-27 VITALS — BP 114/77 | HR 68 | Ht 64.5 in | Wt 178.0 lb

## 2023-08-27 DIAGNOSIS — R208 Other disturbances of skin sensation: Secondary | ICD-10-CM | POA: Diagnosis not present

## 2023-08-27 DIAGNOSIS — Z79899 Other long term (current) drug therapy: Secondary | ICD-10-CM

## 2023-08-27 DIAGNOSIS — G35 Multiple sclerosis: Secondary | ICD-10-CM | POA: Diagnosis not present

## 2023-08-27 DIAGNOSIS — R41840 Attention and concentration deficit: Secondary | ICD-10-CM

## 2023-08-27 DIAGNOSIS — N399 Disorder of urinary system, unspecified: Secondary | ICD-10-CM

## 2023-08-27 DIAGNOSIS — G43009 Migraine without aura, not intractable, without status migrainosus: Secondary | ICD-10-CM | POA: Diagnosis not present

## 2023-08-27 NOTE — Progress Notes (Signed)
 [facility]  PATIENT: Kelly Alvarado DOB: Jun 12, 1975  REFERRING DOCTOR OR PCP:  Catheryn Slocumb  SOURCE: patient and records  _________________________________   HISTORICAL  CHIEF COMPLAINT:  Chief Complaint  Patient presents with   Follow-up    Pt in room 11.alone. Here for MS follow up. DMT: Ocrevus Last infusion date: 04/04/23 Next infusion date: 10/03/23. Pt reports being stable, pt said balance is off. Pt said left foot toes feel different. No falls.      HISTORY OF PRESENT ILLNESS:  Kelly Alvarado is a 48 year old woman with relapsing remitting MS diagnosed in 2010 and h/o a seizure in 2017.  History of Present Illness: Update 08/27/2023: She is on Ocrevus now and feels worse the last month of each cycle.   She is currently in the last month and feels much more tired and notes more neurologic symptoms with gait.  Next one at end of August. She denies any exacerbations.   However, she is feeling worse in general    She moved to [place], [place] but is now back in [place]. She often feels a little worse the last month of each infusion cycle.    Gait is slightly worse.  She now regularly uses the  bannister on stairs.   Heat is affecting her more  She can walk > 3 miles.   No falls.   No clumsiness.   She has mild tingling but no pain in her limbs.  Gabapentin  helps the tingling.    She has mild left sided weakness.     She has had frequent UTIs and feels she does not completely empty.  Tamsulosin  had helped in past.  She has had fatigue and reduced focus/attention.   Adderall has helped.      No LOC spells or seizures recently.  Only seizure was in 2017 a few days after hysterectomy.    Chronic migraines are much better with monthly Emgality .  She will take Maxalt  when one occurs.     In past tried topiramate , zonisamide , nortriptyline and NSAIDs without much benefit.     She is on Vit D supplements 50000 U weekly    MS History:    She was diagnosed with  multiple sclerosis in 2010 after presenting with a left foot drop. MRI of the brain was consistent with multiple sclerosis.   She was  started on Rebif. The MS was well controlled but she felt poorly on that medication. She transferred to [facility] and began to see me in 2012. She was placed on Tysabri for about a year and felt much better while on it. Her MS did well on Tysabri as well.  Unfortunately, she was JCV positive and after about a year she stopped Tysabri and started Gilenya . She had done fairly well on Gilenya  with no clinical exacerbations.  However, she switched to Ocrevus in August 2020 after MRI showed new lesions.    IMAGING MRI brain 07/01/2018 shows multiple T2/flair hyperintense foci in the spinal cord, left middle cerebellar peduncle and in the periventricular, juxtacortical and deep white matter of the hemispheres.  These are consistent with chronic demyelinating plaque associated with multiple sclerosis.  None of the foci enhances or appears to be acute.  However, the 2 foci in the spinal cord and several of the foci in the hemispheres were not present on the 2012 MRI.   There is a normal enhancement pattern and no acute findings.  MRI cervical spine 07/01/2018 shows T2 hyperintense foci  within the spinal cord adjacent to C1-C2, C3-C4, C5 and T3.  These are consistent with chronic demyelinating plaque associated with multiple sclerosis.  None of the foci enhanced on the current study.  However, none of the 4 foci were noted on the MRI from 2010     REVIEW OF SYSTEMS: Constitutional: No fevers, chills, sweats, or change in appetite.  She notes fatigue and insomnia. Eyes: No double vision, eye pain.  She has right visula loss over last year.   Ear, nose and throat: No hearing loss, ear pain, nasal congestion, sore throat Cardiovascular: No chest pain, palpitations Respiratory:  No shortness of breath at rest or with exertion.   No wheezes GastrointestinaI: No nausea,  vomiting, diarrhea, abdominal pain, fecal incontinence Genitourinary:  No dysuria, urinary retention or frequency.  No nocturia. Musculoskeletal:  No neck pain, back .  Notes myalgias at times Integumentary: No rash, pruritus, skin lesions Neurological: as above Psychiatric: No depression at this time.  No anxiety Endocrine: No palpitations, diaphoresis, change in appetite, change in weigh or increased thirst Hematologic/Lymphatic:  No anemia, purpura, petechiae. Allergic/Immunologic: No itchy/runny eyes, nasal congestion, recent allergic reactions, rashes.  She has some food allergies.  ALLERGIES: Allergies  Allergen Reactions   Amoxicillin Hives    Has patient had a PCN reaction causing immediate rash, facial/tongue/throat swelling, SOB or lightheadedness with hypotension: Yes Has patient had a PCN reaction causing severe rash involving mucus membranes or skin necrosis: Yes Has patient had a PCN reaction that required hospitalization No Has patient had a PCN reaction occurring within the last 10 years: No If all of the above answers are NO, then may proceed with Cephalosporin use.    Milk-Related Compounds Diarrhea and Nausea And Vomiting    Stomach cramps   Yellow Dyes (Non-Tartrazine) Hives    HOME MEDICATIONS:  Current Outpatient Medications:    amphetamine -dextroamphetamine (ADDERALL XR) 25 MG 24 hr capsule, Take 1 capsule by mouth daily., Disp: 90 capsule, Rfl: 0   betamethasone  dipropionate 0.05 % cream, Apply topically 2 (two) times daily., Disp: 30 g, Rfl: 2   dicyclomine  (BENTYL ) 20 MG tablet, Take 1 tablet (20 mg total) by mouth every 6 (six) hours. prn, Disp: 90 tablet, Rfl: 1   doxepin  (SINEQUAN ) 10 MG capsule, Take 1 capsule (10 mg total) by mouth at bedtime., Disp: 90 capsule, Rfl: 3   fluticasone  (FLONASE ) 50 MCG/ACT nasal spray, SPRAY 2 SPRAYS INTO EACH NOSTRIL EVERY DAY, Disp: 48 mL, Rfl: 3   gabapentin  (NEURONTIN ) 300 MG capsule, TAKE 1 CAPSULE IN AM, AND 1 CAP  IN EVENING, AND 2 AT BEDTIME, Disp: 360 capsule, Rfl: 3   Galcanezumab -gnlm (EMGALITY ) 120 MG/ML SOAJ, INJECT 1 ML INTO THE SKIN ONCE EVERY 30 DAYS, Disp: 3 mL, Rfl: 3   HYDROcodone  bit-homatropine (HYDROMET) 5-1.5 MG/5ML syrup, Take 5 mLs by mouth every 6 (six) hours as needed for cough., Disp: 120 mL, Rfl: 0   levocetirizine (XYZAL ) 5 MG tablet, TAKE 1 TABLET BY MOUTH ONCE DAILY IN THE EVENING as needed, Disp: 90 tablet, Rfl: 2   ocrelizumab (OCREVUS) 300 MG/10ML injection, Inject 600 mg into the vein every 6 (six) months., Disp: , Rfl:    predniSONE  (DELTASONE ) 5 MG tablet, Take medication as directed., Disp: 21 tablet, Rfl: 0   rizatriptan  (MAXALT -MLT) 10 MG disintegrating tablet, TAKE 1 TABLET BY MOUTH AS NEEDED FOR MIGRAINE. MAY REPEAT IN 2 HOURS IF NEEDED, Disp: 27 tablet, Rfl: 3   tacrolimus  (PROTOPIC ) 0.03 % ointment, Apply topically  2 (two) times daily., Disp: 100 g, Rfl: 2   tamsulosin  (FLOMAX ) 0.4 MG CAPS capsule, Take 1 capsule (0.4 mg total) by mouth daily., Disp: 90 capsule, Rfl: 3   Vitamin D , Ergocalciferol , (DRISDOL ) 1.25 MG (50000 UNIT) CAPS capsule, Take 1 capsule (50,000 Units total) by mouth 2 (two) times a week., Disp: 24 capsule, Rfl: 1   benzonatate  (TESSALON  PERLES) 100 MG capsule, Take 1 capsule (100 mg total) by mouth 3 (three) times daily as needed. (Patient not taking: Reported on 08/27/2023), Disp: 30 capsule, Rfl: 0   ZEPBOUND  2.5 MG/0.5ML injection vial, INJECT 0.5 ML (2.5 MG) UNDER THE SKIN ONCE WEEKLY (0.5ML= 50 UNITS) (Patient not taking: Reported on 08/27/2023), Disp: 2 mL, Rfl: 1  Current Facility-Administered Medications:    0.9 %  sodium chloride  infusion, , Intravenous, PRN, Georgina Speaks, FNP  PAST MEDICAL HISTORY: Past Medical History:  Diagnosis Date   Allergy    Anemia    HEAVY PERIODS   Headache(784.0)    MIGRAINES   Multiple sclerosis (HCC)    Neuromuscular disorder (HCC)    MS   Pulmonary embolism (HCC) 2013   lt-?bcp   Wears glasses      PAST SURGICAL HISTORY: Past Surgical History:  Procedure Laterality Date   ABDOMINAL HYSTERECTOMY     BILATERAL SALPINGECTOMY Bilateral 12/27/2015   Procedure: BILATERAL SALPINGECTOMY;  Surgeon: Jon Rummer, MD;  Location: [facility];  Service: Gynecology;  Laterality: Bilateral;  Excision of paratubal cyst   BREAST LUMPECTOMY WITH RADIOACTIVE SEED LOCALIZATION Left 10/06/2013   Procedure:  RADIOACTIVE SEED LOCALIZATION LEFT BREAST LUMPECTOMY;  Surgeon: Krystal JINNY Russell, MD;  Location: [facility];  Service: General;  Laterality: Left;   BREAST REDUCTION SURGERY     BREAST SURGERY     CYSTOSCOPY N/A 12/27/2015   Procedure: CYSTOSCOPY;  Surgeon: Jon Rummer, MD;  Location: [facility];  Service: Gynecology;  Laterality: N/A;   REPAIR VAGINAL CUFF N/A 01/09/2016   Procedure: REPAIR VAGINAL CUFF;  Surgeon: Jon Rummer, MD;  Location: [facility];  Service: Gynecology;  Laterality: N/A;   VAGINAL HYSTERECTOMY Bilateral 12/27/2015   Procedure: HYSTERECTOMY VAGINAL;  Surgeon: Jon Rummer, MD;  Location: [facility];  Service: Gynecology;  Laterality: Bilateral;   WISDOM TOOTH EXTRACTION      FAMILY HISTORY: Family History  Problem Relation Age of Onset   Asthma Mother    Hypertension Father    Cancer Paternal Uncle    Cancer Paternal Grandfather        DIAGNOSTIC DATA (LABS, IMAGING, TESTING) - I reviewed patient records, labs, notes, testing and imaging myself where available.  Lab Results  Component Value Date   WBC 3.7 12/07/2021   HGB 11.6 12/07/2021   HCT 34.0 12/07/2021   MCV 91 12/07/2021   PLT 310 12/07/2021   ______________________________________________________________________________ Physical Exam    Today's Vitals   08/27/23 1322  BP: 114/77  Pulse: 68  Weight: 178 lb (80.7 kg)  Height: 5' 4.5 (1.638 m)    Body mass index is 30.08 kg/m.    General: The patient is well-developed and well-nourished and in no acute distress    Musculoskeletal:  Neck has good ROM and is nontender     Neurologic Exam   Mental status: The patient is alert and oriented x 3 at the time of the examination. The patient has apparent normal recent and remote memory, with an apparently normal attention span and concentration ability.   Speech is normal.   Cranial nerves: Extraocular  movements are full.  Facial strength and sensation are normal.     No obvious hearing deficits are noted.   Motor:  Muscle bulk is normal.   Tone is normal. Strength is  5 / 5 in all 4 extremities.    Sensory: He has intact sensation to touch and vibration in the arms and legs.   Coordination: Cerebellar testing reveals good finger-nose-finger and heel-to-shin bilaterally.   Gait and station: Station is normal.   Gait was fairly normal .  Tandem gait is wide.. Romberg is negative.    Reflexes: Deep tendon reflexes are symmetric and normal bilaterally    ASSESSMENT AND PLAN    1. Multiple sclerosis (HCC)   2. High risk medication use   3. Dysesthesia   4. Common migraine without intractability   5. Attention deficit   6. Urinary disorder       1.   She is not tolerating Ocrevus well.  Check IgG/IgM .  We discussed switching to Briumvi has this may have fewer side effects 2.    Continue Emgality  for chronic migraine   Maxalt  or Tylenol  for breakthrough.  Continue low-dose doxepin  for insomnia.   3.  Tamsulosin  if urinary hesitancy worsens 4.    She will return as needed if there are new or worsening neurologic symptoms.  If she returns to the area I would be happy to see her again for her MS.   Vianne Grieshop A. Vear, MD, PhD, FAAN Certified in Neurology, Clinical Neurophysiology, Sleep Medicine, Pain Medicine and Neuroimaging Director, [facility] at [facility]  [facility] [address], [address] [place], [place] [address] [phone]

## 2023-08-28 ENCOUNTER — Ambulatory Visit: Payer: Self-pay | Admitting: Neurology

## 2023-08-28 LAB — CBC WITH DIFFERENTIAL/PLATELET
Basophils Absolute: 0 x10E3/uL (ref 0.0–0.2)
Basos: 1 %
EOS (ABSOLUTE): 0 x10E3/uL (ref 0.0–0.4)
Eos: 1 %
Hematocrit: 37.5 % (ref 34.0–46.6)
Hemoglobin: 12.2 g/dL (ref 11.1–15.9)
Immature Grans (Abs): 0 x10E3/uL (ref 0.0–0.1)
Immature Granulocytes: 0 %
Lymphocytes Absolute: 1 x10E3/uL (ref 0.7–3.1)
Lymphs: 20 %
MCH: 31.7 pg (ref 26.6–33.0)
MCHC: 32.5 g/dL (ref 31.5–35.7)
MCV: 97 fL (ref 79–97)
Monocytes Absolute: 0.5 x10E3/uL (ref 0.1–0.9)
Monocytes: 9 %
Neutrophils Absolute: 3.5 x10E3/uL (ref 1.4–7.0)
Neutrophils: 69 %
Platelets: 297 x10E3/uL (ref 150–450)
RBC: 3.85 x10E6/uL (ref 3.77–5.28)
RDW: 13.3 % (ref 11.7–15.4)
WBC: 5.1 x10E3/uL (ref 3.4–10.8)

## 2023-08-28 LAB — IGG, IGA, IGM
IgA/Immunoglobulin A, Serum: 80 mg/dL — ABNORMAL LOW (ref 87–352)
IgG (Immunoglobin G), Serum: 834 mg/dL (ref 586–1602)
IgM (Immunoglobulin M), Srm: 28 mg/dL (ref 26–217)

## 2023-09-05 ENCOUNTER — Telehealth: Payer: Self-pay | Admitting: *Deleted

## 2023-09-05 NOTE — Telephone Encounter (Signed)
 Spoke w/ Dr. Vear. Pt cleared to switch to Briumvi. Will fax in [facility]rium[facility] start form and provide orders to [facility] to start approval process.

## 2023-09-05 NOTE — Telephone Encounter (Signed)
 Faxed completed/signed Briumvi start form to [facility] Patient Support at [phone]. Received fax confirmation. Gave completed/signed orders to [facility] to start processing/get approval via insurance prior to scheduling pt.  Per Dr. Vear, pt should infuse end of August.

## 2023-09-09 NOTE — Telephone Encounter (Signed)
 Gave copy below to [facility]

## 2023-09-26 NOTE — Telephone Encounter (Signed)
 Patient has been scheduled for Briumvi on 10/03/23 & 10/16/23 per [facility] in infusion suite.

## 2023-12-20 LAB — HM MAMMOGRAPHY

## 2023-12-23 ENCOUNTER — Encounter: Payer: Self-pay | Admitting: Internal Medicine

## 2024-01-20 ENCOUNTER — Encounter: Payer: Self-pay | Admitting: Neurology

## 2024-01-20 ENCOUNTER — Ambulatory Visit: Admitting: Neurology

## 2024-01-20 ENCOUNTER — Telehealth: Payer: Self-pay | Admitting: Pharmacist

## 2024-01-20 VITALS — BP 122/63 | HR 67 | Ht 64.0 in | Wt 185.5 lb

## 2024-01-20 DIAGNOSIS — G35A Relapsing-remitting multiple sclerosis: Secondary | ICD-10-CM

## 2024-01-20 DIAGNOSIS — R41840 Attention and concentration deficit: Secondary | ICD-10-CM

## 2024-01-20 DIAGNOSIS — Z79899 Other long term (current) drug therapy: Secondary | ICD-10-CM | POA: Diagnosis not present

## 2024-01-20 DIAGNOSIS — G4700 Insomnia, unspecified: Secondary | ICD-10-CM

## 2024-01-20 DIAGNOSIS — G43009 Migraine without aura, not intractable, without status migrainosus: Secondary | ICD-10-CM | POA: Diagnosis not present

## 2024-01-20 DIAGNOSIS — N399 Disorder of urinary system, unspecified: Secondary | ICD-10-CM

## 2024-01-20 DIAGNOSIS — R208 Other disturbances of skin sensation: Secondary | ICD-10-CM

## 2024-01-20 MED ORDER — EMGALITY 120 MG/ML ~~LOC~~ SOAJ: SUBCUTANEOUS | 3 refills | Status: AC

## 2024-01-20 MED ORDER — RIZATRIPTAN BENZOATE 10 MG PO TBDP: ORAL_TABLET | ORAL | 3 refills | Status: AC

## 2024-01-20 MED ORDER — AMPHETAMINE-DEXTROAMPHET ER 25 MG PO CP24: 25.0000 mg | ORAL_CAPSULE | Freq: Every day | ORAL | 0 refills | Status: AC

## 2024-01-20 MED ORDER — TRAZODONE HCL 50 MG PO TABS: ORAL_TABLET | ORAL | 5 refills | Status: AC

## 2024-01-20 NOTE — Progress Notes (Signed)
 [facility]  PATIENT: Kelly Alvarado DOB: 1975/04/11  REFERRING DOCTOR OR PCP:  Catheryn Slocumb  SOURCE: patient and records  _________________________________   HISTORICAL  CHIEF COMPLAINT:  Chief Complaint  Patient presents with   Follow-up    Rm10, alone, Pt is here for ms and migraine follow up. Pt would like refills      HISTORY OF PRESENT ILLNESS:  Kelly Alvarado is a 48 year old woman with relapsing remitting MS diagnosed in 2010 and h/o a seizure in 2017.  History of Present Illness: Update 01/20/2024: She switched to Briumvi from Ocrevus as she was feeling worse the last month of each cycle.   She denies any exacerbations.   However, she is feeling worse in general    She moved to [place], [place] but is now back in [place]. She often feels a little worse the last month of each infusion cycle.    Gait is slightly worse.  She now regularly uses the  bannister on stairs.   Heat is affecting her more  She can walk > 3 miles.   No falls.   No clumsiness.   She has mild tingling but no pain in her limbs.  Gabapentin  helps the tingling.    She has mild left sided weakness.     She has had frequent UTIs and feels she does not completely empty.  Tamsulosin  had helped in past.  She has had fatigue and reduced focus/attention.   Adderall has helped.      No LOC spells or seizures recently.  Only seizure was in 2017 a few days after hysterectomy.     She is sleeping worse - bot sleep onset and maintenance.   ---  Lots of stress with father being sick (hospice now).     Chronic migraines are better with monthly Emgality  but they are more frequent than earlier this year.Kelly Alvarado  She will take Maxalt  when one occurs.     In past tried topiramate , zonisamide , nortriptyline and NSAIDs without much benefit.     She is on Vit D supplements 50000 U weekly  She was on Wegovy  for weight loss but no longer had it covered by insurance    MS History:    She was diagnosed with multiple  sclerosis in 2010 after presenting with a left foot drop. MRI of the brain was consistent with multiple sclerosis.   She was  started on Rebif. The MS was well controlled but she felt poorly on that medication. She transferred to [facility] and began to see me in 2012. She was placed on Tysabri for about a year and felt much better while on it. Her MS did well on Tysabri as well.  Unfortunately, she was JCV positive and after about a year she stopped Tysabri and started Gilenya . She had done fairly well on Gilenya  with no clinical exacerbations.  However, she switched to Ocrevus in August 2020 after MRI showed new lesions.    IMAGING MRI brain 07/01/2018 shows multiple T2/flair hyperintense foci in the spinal cord, left middle cerebellar peduncle and in the periventricular, juxtacortical and deep white matter of the hemispheres.  These are consistent with chronic demyelinating plaque associated with multiple sclerosis.  None of the foci enhances or appears to be acute.  However, the 2 foci in the spinal cord and several of the foci in the hemispheres were not present on the 2012 MRI.   There is a normal enhancement pattern and no acute findings.  MRI  cervical spine 07/01/2018 shows T2 hyperintense foci within the spinal cord adjacent to C1-C2, C3-C4, C5 and T3.  These are consistent with chronic demyelinating plaque associated with multiple sclerosis.  None of the foci enhanced on the current study.  However, none of the 4 foci were noted on the MRI from 2010     REVIEW OF SYSTEMS: Constitutional: No fevers, chills, sweats, or change in appetite.  She notes fatigue and insomnia. Eyes: No double vision, eye pain.  She has right visula loss over last year.   Ear, nose and throat: No hearing loss, ear pain, nasal congestion, sore throat Cardiovascular: No chest pain, palpitations Respiratory:  No shortness of breath at rest or with exertion.   No wheezes GastrointestinaI: No nausea, vomiting,  diarrhea, abdominal pain, fecal incontinence Genitourinary:  No dysuria, urinary retention or frequency.  No nocturia. Musculoskeletal:  No neck pain, back .  Notes myalgias at times Integumentary: No rash, pruritus, skin lesions Neurological: as above Psychiatric: No depression at this time.  No anxiety Endocrine: No palpitations, diaphoresis, change in appetite, change in weigh or increased thirst Hematologic/Lymphatic:  No anemia, purpura, petechiae. Allergic/Immunologic: No itchy/runny eyes, nasal congestion, recent allergic reactions, rashes.  She has some food allergies.  ALLERGIES: Allergies  Allergen Reactions   Amoxicillin Hives    Has patient had a PCN reaction causing immediate rash, facial/tongue/throat swelling, SOB or lightheadedness with hypotension: Yes  Has patient had a PCN reaction causing severe rash involving mucus membranes or skin necrosis: Yes  Has patient had a PCN reaction that required hospitalization No  Has patient had a PCN reaction occurring within the last 10 years: No  If all of the above answers are NO, then may proceed with Cephalosporin use.  Has patient had a PCN reaction causing immediate rash, facial/tongue/throat swelling, SOB or lightheadedness with hypotension: Yes Has patient had a PCN reaction causing severe rash involving mucus membranes or skin necrosis: Yes Has patient had a PCN reaction that required hospitalization No Has patient had a PCN reaction occurring within the last 10 years: No If all of the above answers are NO, then may proceed with Cephalosporin use.   Milk-Related Compounds Diarrhea, Nausea And Vomiting and Other (See Comments)    Stomach cramps  Other Reaction(s): stomach ache, diarrhea   Yellow Dyes (Non-Tartrazine) Hives    HOME MEDICATIONS:  Current Outpatient Medications:    betamethasone  dipropionate 0.05 % cream, Apply topically 2 (two) times daily., Disp: 30 g, Rfl: 2   dicyclomine  (BENTYL ) 20 MG tablet,  Take 1 tablet (20 mg total) by mouth every 6 (six) hours. prn, Disp: 90 tablet, Rfl: 1   gabapentin  (NEURONTIN ) 300 MG capsule, TAKE 1 CAPSULE IN AM, AND 1 CAP IN EVENING, AND 2 AT BEDTIME, Disp: 360 capsule, Rfl: 3   tacrolimus  (PROTOPIC ) 0.03 % ointment, Apply topically 2 (two) times daily., Disp: 100 g, Rfl: 2   tamsulosin  (FLOMAX ) 0.4 MG CAPS capsule, Take 1 capsule (0.4 mg total) by mouth daily., Disp: 90 capsule, Rfl: 3   traZODone  (DESYREL ) 50 MG tablet, One or two po qHS, Disp: 60 tablet, Rfl: 5   ublituximab-xiiy (BRIUMVI) 150 MG/6ML SOLN injection, Inject 150 mg into the vein every 6 (six) months., Disp: , Rfl:    amphetamine -dextroamphetamine (ADDERALL XR) 25 MG 24 hr capsule, Take 1 capsule by mouth daily., Disp: 90 capsule, Rfl: 0   Galcanezumab -gnlm (EMGALITY ) 120 MG/ML SOAJ, INJECT 1 ML INTO THE SKIN ONCE EVERY 30 DAYS, Disp: 3 mL, Rfl:  3   rizatriptan  (MAXALT -MLT) 10 MG disintegrating tablet, TAKE 1 TABLET BY MOUTH AS NEEDED FOR MIGRAINE. MAY REPEAT IN 2 HOURS IF NEEDED, Disp: 27 tablet, Rfl: 3  Current Facility-Administered Medications:    0.9 %  sodium chloride  infusion, , Intravenous, PRN, Georgina Speaks, FNP  PAST MEDICAL HISTORY: Past Medical History:  Diagnosis Date   Allergy    Anemia    HEAVY PERIODS   Headache(784.0)    MIGRAINES   Multiple sclerosis    Neuromuscular disorder (HCC)    MS   Pulmonary embolism (HCC) 2013   lt-?bcp   Wears glasses     PAST SURGICAL HISTORY: Past Surgical History:  Procedure Laterality Date   ABDOMINAL HYSTERECTOMY     BILATERAL SALPINGECTOMY Bilateral 12/27/2015   Procedure: BILATERAL SALPINGECTOMY;  Surgeon: Jon Rummer, MD;  Location: [facility];  Service: Gynecology;  Laterality: Bilateral;  Excision of paratubal cyst   BREAST LUMPECTOMY WITH RADIOACTIVE SEED LOCALIZATION Left 10/06/2013   Procedure:  RADIOACTIVE SEED LOCALIZATION LEFT BREAST LUMPECTOMY;  Surgeon: Krystal JINNY Russell, MD;  Location: [facility];   Service: General;  Laterality: Left;   BREAST REDUCTION SURGERY     BREAST SURGERY     CYSTOSCOPY N/A 12/27/2015   Procedure: CYSTOSCOPY;  Surgeon: Jon Rummer, MD;  Location: [facility];  Service: Gynecology;  Laterality: N/A;   REPAIR VAGINAL CUFF N/A 01/09/2016   Procedure: REPAIR VAGINAL CUFF;  Surgeon: Jon Rummer, MD;  Location: [facility];  Service: Gynecology;  Laterality: N/A;   VAGINAL HYSTERECTOMY Bilateral 12/27/2015   Procedure: HYSTERECTOMY VAGINAL;  Surgeon: Jon Rummer, MD;  Location: [facility];  Service: Gynecology;  Laterality: Bilateral;   WISDOM TOOTH EXTRACTION      FAMILY HISTORY: Family History  Problem Relation Age of Onset   Asthma Mother    Hypertension Father    Cancer Paternal Uncle    Cancer Paternal Grandfather        DIAGNOSTIC DATA (LABS, IMAGING, TESTING) - I reviewed patient records, labs, notes, testing and imaging myself where available.  Lab Results  Component Value Date   WBC 5.1 08/27/2023   HGB 12.2 08/27/2023   HCT 37.5 08/27/2023   MCV 97 08/27/2023   PLT 297 08/27/2023   ______________________________________________________________________________ Physical Exam    Today's Vitals   01/20/24 0821  BP: 122/63  Pulse: 67  SpO2: 98%  Weight: 185 lb 8 oz (84.1 kg)  Height: 5' 4 (1.626 m)    Body mass index is 31.84 kg/m.    General: The patient is well-developed and well-nourished and in no acute distress   Musculoskeletal:  Neck has good ROM and is nontender     Neurologic Exam   Mental status: The patient is alert and oriented x 3 at the time of the examination. The patient has apparent normal recent and remote memory, with an apparently normal attention span and concentration ability.   Speech is normal.   Cranial nerves: Extraocular movements are full.  Facial strength and sensation are normal.     No obvious hearing deficits are noted.   Motor:  Muscle bulk is normal.   Tone is normal. Strength is  5 / 5 in all 4  extremities.    Sensory: He has intact sensation to touch and vibration in the arms and legs.   Coordination: Cerebellar testing reveals good finger-nose-finger and heel-to-shin bilaterally.   Gait and station: Station is normal.   The gait was normal though the tandem gait was wide.  Romberg is negative.    Reflexes: Deep tendon reflexes are symmetric and normal bilaterally    ASSESSMENT AND PLAN    1. Multiple sclerosis, relapsing-remitting   2. High risk medication use   3. Dysesthesia   4. Common migraine without intractability   5. Attention deficit   6. Urinary disorder   7. Insomnia, unspecified type        1.   She switched to Briumvi as she was not tolerating the Ocrevus as well.  She tolerated the first infusion without difficulty.   2.    Continue Emgality  for chronic migraine   Maxalt  or Tylenol  for breakthrough.  If migraine frequency worsens could consider adding Topamax  (did not help her much in the past though she took it as monotherapy).  I added trazodone  to help with the insomnia.   3.  Stay active and exercise as tolerated. 4.   Return in 6 months or sooner if new or worsening neurologic symptoms.     Kelly Alvarado A. Vear, MD, PhD, FAAN Certified in Neurology, Clinical Neurophysiology, Sleep Medicine, Pain Medicine and Neuroimaging Director, Multiple Sclerosis Center at [facility]  [facility] [address], [address] [place], [place] [address] [phone]

## 2024-01-20 NOTE — Telephone Encounter (Signed)
 Pharmacy Patient Advocate Encounter   Received notification from Patient Pharmacy that prior authorization for Emgality  120MG /ML syringes (migraine) is required/requested.   Insurance verification completed.   The patient is insured through [facility].   Per test claim: PA required; PA submitted to above mentioned insurance via Latent Key/confirmation #/EOC [id number] Status is pending

## 2024-01-21 NOTE — Telephone Encounter (Signed)
 Pharmacy Patient Advocate Encounter  Received notification from [facility] that Prior Authorization for Emgality  120MG /ML syringes (migraine)  has been APPROVED from 01/20/2024 to 01/19/2025   PA #/Case ID/Reference #: [id number]

## 2024-03-03 ENCOUNTER — Telehealth: Payer: Self-pay | Admitting: *Deleted

## 2024-03-03 NOTE — Telephone Encounter (Signed)
 Mliss with [employer] stated:    Patient Kelly Alvarado  DOB 08/08/2075 just called. She has lost her insurance. She is currently on Briumvi and has an apt February 16th. I know we have to get the Provider's permission in order to submit for Free Drug. Can you check with Dr. Vear to see if he would like to submit for free drug for this patient?

## 2024-03-04 NOTE — Telephone Encounter (Signed)
 Received message from [place] with [facility].  She will submit her PIF as free drug. She will let us  know if they need anything else from Dr. Vear.

## 2024-03-04 NOTE — Telephone Encounter (Signed)
 I sent message back to [facility] that it is ok to pursue free drug for pt.

## 2024-07-20 ENCOUNTER — Ambulatory Visit: Admitting: Neurology
